# Patient Record
Sex: Female | Born: 1990 | Race: Black or African American | Hispanic: No | Marital: Married | Smoking: Never smoker
Health system: Southern US, Community
[De-identification: ages and names within clinical notes are randomized; demographics above are authoritative.]

## PROBLEM LIST (undated history)

## (undated) ENCOUNTER — Inpatient Hospital Stay (HOSPITAL_COMMUNITY): Payer: Self-pay

## (undated) DIAGNOSIS — N390 Urinary tract infection, site not specified: Secondary | ICD-10-CM

## (undated) DIAGNOSIS — Z789 Other specified health status: Secondary | ICD-10-CM

## (undated) HISTORY — DX: Other specified health status: Z78.9

---
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
## 2014-01-11 ENCOUNTER — Encounter (HOSPITAL_COMMUNITY): Payer: Self-pay | Admitting: Emergency Medicine

## 2014-01-11 ENCOUNTER — Emergency Department (HOSPITAL_COMMUNITY)
Admission: EM | Admit: 2014-01-11 | Discharge: 2014-01-11 | Disposition: A | Discharge status: Home / Self Care | Payer: Self-pay | Attending: Emergency Medicine | Admitting: Emergency Medicine

## 2014-01-11 DIAGNOSIS — R202 Paresthesia of skin: Secondary | ICD-10-CM

## 2014-01-11 DIAGNOSIS — R209 Unspecified disturbances of skin sensation: Secondary | ICD-10-CM | POA: Insufficient documentation

## 2014-01-11 DIAGNOSIS — R200 Anesthesia of skin: Secondary | ICD-10-CM

## 2014-01-11 NOTE — ED Notes (Signed)
Pt. reports generalized body intermittent numbness/tingling onset this morning , denies pain , ambulatory , alert and oriented /respirations unlabored .

## 2014-01-11 NOTE — ED Provider Notes (Signed)
CSN: [id number]     Arrival date & time 01/11/14  0302 History   First MD Initiated Contact with Patient 01/11/14 [phone]     Chief Complaint  Patient presents with  . Numbness  . Tingling     (Consider location/radiation/quality/duration/timing/severity/associated sxs/prior Treatment) HPI Comments: Pt comes in with cc of numbness, tingling. She has no medical hx, currently not on any meds, and has no neuro hx. She reports that after her work she started having numbness - over her bilateral upper and lower extremity. With that she had no weakness. Numbness is described as "being numb," and there was no tingling. She has had no sx like that. Symptoms went from 1 am to 2 am - and resolved on their own. No family hx of MS or any neuro disorders.  The history is provided by the patient.    History reviewed. No pertinent past medical history. History reviewed. No pertinent past surgical history. No family history on file. History  Substance Use Topics  . Smoking status: Never Smoker   . Smokeless tobacco: Not on file  . Alcohol Use: No   OB History   Grav Para Term Preterm Abortions TAB SAB Ect Mult Living                 Review of Systems  Constitutional: Negative for fever and activity change.  Respiratory: Negative for shortness of breath.   Cardiovascular: Negative for chest pain.  Gastrointestinal: Negative for nausea, vomiting and abdominal pain.  Genitourinary: Negative for dysuria.  Musculoskeletal: Negative for back pain and neck pain.  Skin: Negative for rash.  Neurological: Positive for numbness. Negative for dizziness, seizures, facial asymmetry, speech difficulty, weakness, light-headedness and headaches.      Allergies  Review of patient's allergies indicates no known allergies.  Home Medications   Prior to Admission medications   Not on File   BP 141/94  Pulse 98  Temp(Src) 98.2 F (36.8 C) (Oral)  Resp 14  Ht 4\' 11"  (1.499 m)  Wt 167 lb (75.751 kg)   BMI 33.71 kg/m2  SpO2 99% Physical Exam  Nursing note and vitals reviewed. Constitutional: She is oriented to person, place, and time. She appears well-developed and well-nourished.  HENT:  Head: Normocephalic and atraumatic.  Eyes: EOM are normal. Pupils are equal, round, and reactive to light.  Neck: Neck supple.  Cardiovascular: Normal rate, regular rhythm and normal heart sounds.   No murmur heard. Pulmonary/Chest: Effort normal. No respiratory distress.  Abdominal: Soft. She exhibits no distension. There is no tenderness. There is no rebound and no guarding.  Neurological: She is alert and oriented to person, place, and time. No cranial nerve deficit. Coordination normal.  Cerebellar exam is normal (finger to nose) Sensory exam normal for bilateral upper and lower extremities - and patient is able to discriminate between sharp and dull. Motor exam is 4+/5   Skin: Skin is warm and dry.    ED Course  Procedures (including critical care time) Labs Review Labs Reviewed - No data to display  Imaging Review No results found.   EKG Interpretation None      MDM   Final diagnoses:  Numbness and tingling   Pt comes in with cc of numbness. She has no numbness right now. Bilateral upper and lower extremities, no rash, no weakness, no tick bites, no neurologic dz for self or family, and no n/v.  Will d/c     Derwood KaplanAnkit Nanavati, MD 01/11/14 [phone]

## 2014-01-11 NOTE — ED Notes (Signed)
Patient states that she has been having times of numbness and tingling that come and go thru out her body.  Patient denies any pain at this time.

## 2014-01-11 NOTE — Discharge Instructions (Signed)

## 2014-01-23 ENCOUNTER — Encounter (HOSPITAL_COMMUNITY): Payer: Self-pay | Admitting: Emergency Medicine

## 2014-01-23 ENCOUNTER — Emergency Department (HOSPITAL_COMMUNITY)
Admission: EM | Admit: 2014-01-23 | Discharge: 2014-01-23 | Disposition: A | Discharge status: Home / Self Care | Payer: Self-pay | Attending: Emergency Medicine | Admitting: Emergency Medicine

## 2014-01-23 DIAGNOSIS — R42 Dizziness and giddiness: Secondary | ICD-10-CM | POA: Insufficient documentation

## 2014-01-23 DIAGNOSIS — Z3202 Encounter for pregnancy test, result negative: Secondary | ICD-10-CM | POA: Insufficient documentation

## 2014-01-23 LAB — BASIC METABOLIC PANEL
BUN: 6 mg/dL (ref 6–23)
CALCIUM: 8.8 mg/dL (ref 8.4–10.5)
CHLORIDE: 102 meq/L (ref 96–112)
CO2: 25 mEq/L (ref 19–32)
CREATININE: 0.57 mg/dL (ref 0.50–1.10)
GFR calc non Af Amer: >90 mL/min (ref >90)
Glucose, Bld: 110 mg/dL — ABNORMAL HIGH (ref 70–99)
Potassium: 3.3 mEq/L — ABNORMAL LOW (ref 3.7–5.3)
Sodium: 143 mEq/L (ref 137–147)

## 2014-01-23 LAB — CBC
HEMATOCRIT: 37.9 % (ref 36.0–46.0)
Hemoglobin: 12.7 g/dL (ref 12.0–15.0)
MCH: 29.7 pg (ref 26.0–34.0)
MCHC: 33.5 g/dL (ref 30.0–36.0)
MCV: 88.8 fL (ref 78.0–100.0)
PLATELETS: 331 10*3/uL (ref 150–400)
RBC: 4.27 MIL/uL (ref 3.87–5.11)
RDW: 12.6 % (ref 11.5–15.5)
WBC: 4.3 10*3/uL (ref 4.0–10.5)

## 2014-01-23 LAB — URINALYSIS, ROUTINE W REFLEX MICROSCOPIC
BILIRUBIN URINE: NEGATIVE
Glucose, UA: NEGATIVE mg/dL
Hgb urine dipstick: NEGATIVE
KETONES UR: 15 mg/dL — AB
LEUKOCYTES UA: NEGATIVE
NITRITE: NEGATIVE
PH: 6 (ref 5.0–8.0)
PROTEIN: NEGATIVE mg/dL
Specific Gravity, Urine: 1.026 (ref 1.005–1.030)
Urobilinogen, UA: 1 mg/dL (ref 0.0–1.0)

## 2014-01-23 LAB — POC URINE PREG, ED: Preg Test, Ur: NEGATIVE

## 2014-01-23 MED ORDER — MECLIZINE HCL 25 MG PO TABS: 25.0000 mg | ORAL_TABLET | Freq: Three times a day (TID) | ORAL | Status: DC

## 2014-01-23 MED ORDER — MECLIZINE HCL 25 MG PO TABS
25.0000 mg | ORAL_TABLET | Freq: Once | ORAL | Status: AC
  Administered 2014-01-23: 25 mg via ORAL
  Filled 2014-01-23: qty 1

## 2014-01-23 NOTE — ED Notes (Signed)
She states shes felt lightheaded and tingly all over her body for several weeks. She came here on 6/18 for same "but they didn't find anything wrong." shes a&ox4, breathing easily, denies pain

## 2014-01-23 NOTE — ED Provider Notes (Signed)
CSN: [id number]     Arrival date & time 01/23/14  1702 History   First MD Initiated Contact with Patient 01/23/14 1833     Chief Complaint  Patient presents with  . Dizziness     (Consider location/radiation/quality/duration/timing/severity/associated sxs/prior Treatment) HPI Patient presents with concern of episodic lightheadedness. Symptoms are actually began several weeks ago, have been persistent, in spite of being seen here 2 weeks ago. She notes that typically upon standing up she has episodic lightheadedness, dizziness, with no syncope, pain. Symptoms seem to resolve without particular intervention. Symptoms are only present with upright positioning. There is a no concurrent pain, confusion, disorientation, dyspnea, nausea, vomiting. Patient does not have a primary care physician.  History reviewed. No pertinent past medical history. History reviewed. No pertinent past surgical history. History reviewed. No pertinent family history. History  Substance Use Topics  . Smoking status: Never Smoker   . Smokeless tobacco: Not on file  . Alcohol Use: Yes   OB History   Grav Para Term Preterm Abortions TAB SAB Ect Mult Living                 Review of Systems  Constitutional:       Per HPI, otherwise negative  HENT:       Per HPI, otherwise negative  Respiratory:       Per HPI, otherwise negative  Cardiovascular:       Per HPI, otherwise negative  Gastrointestinal: Negative for vomiting.  Endocrine:       Negative aside from HPI  Genitourinary:       Neg aside from HPI   Musculoskeletal:       Per HPI, otherwise negative  Skin: Negative.   Neurological: Positive for dizziness. Negative for syncope.      Allergies  Review of patient's allergies indicates no known allergies.  Home Medications   Prior to Admission medications   Not on File   BP 139/84  Pulse 78  Temp(Src) 98.3 F (36.8 C) (Oral)  Resp 20  Ht 5' (1.524 m)  Wt 160 lb (72.576 kg)  BMI  31.25 kg/m2  SpO2 100% Physical Exam  Nursing note and vitals reviewed. Constitutional: She is oriented to person, place, and time. She appears well-developed and well-nourished. No distress.  HENT:  Head: Normocephalic and atraumatic.  Eyes: Conjunctivae and EOM are normal.  Cardiovascular: Normal rate and regular rhythm.   Pulmonary/Chest: Effort normal and breath sounds normal. No stridor. No respiratory distress.  Abdominal: She exhibits no distension.  Musculoskeletal: She exhibits no edema.  Neurological: She is alert and oriented to person, place, and time. She displays no atrophy and no tremor. No cranial nerve deficit. She exhibits normal muscle tone. She displays no seizure activity. Coordination normal.  Skin: Skin is warm and dry.  Psychiatric: She has a normal mood and affect.    ED Course  Procedures (including critical care time) Labs Review Labs Reviewed  BASIC METABOLIC PANEL - Abnormal; Notable for the following:    Potassium 3.3 (*)    Glucose, Bld 110 (*)    All other components within normal limits  CBC  URINALYSIS, ROUTINE W REFLEX MICROSCOPIC  POC URINE PREG, ED       MDM   Patient presents with episodic lightheadedness, typically with upright positioning.  Patient's evaluation here is largely reassuring, with no evidence of infection, neurologic compromise, focal findings on exam. Patient may have vertigo, versus vasogenic anemia, the patient's blood work today does not  suggest profound anemia. Patient was discharged with a course of empiric medication for vertigo, provided resources for outpatient followup.  Gerhard Munchobert Lockwood, MD 01/23/14 [phone]

## 2014-01-23 NOTE — Discharge Instructions (Signed)
As discussed, your evaluation today has been largely reassuring.  But, it is important that you monitor your condition carefully, and do not hesitate to return to the ED if you develop new, or concerning changes in your condition.  Otherwise, please follow-up with your new physician for appropriate ongoing care.   Dizziness Dizziness is a common problem. It is a feeling of unsteadiness or light-headedness. You may feel like you are about to faint. Dizziness can lead to injury if you stumble or fall. A person of any age group can suffer from dizziness, but dizziness is more common in older adults. CAUSES  Dizziness can be caused by many different things, including:  Middle ear problems.  Standing for too long.  Infections.  An allergic reaction.  Aging.  An emotional response to something, such as the sight of blood.  Side effects of medicines.  Tiredness.  Problems with circulation or blood pressure.  Excessive use of alcohol or medicines, or illegal drug use.  Breathing too fast (hyperventilation).  An irregular heart rhythm (arrhythmia).  A low red blood cell count (anemia).  Pregnancy.  Vomiting, diarrhea, fever, or other illnesses that cause body fluid loss (dehydration).  Diseases or conditions such as Parkinson's disease, high blood pressure (hypertension), diabetes, and thyroid problems.  Exposure to extreme heat. DIAGNOSIS  Your health care provider will ask about your symptoms, perform a physical exam, and perform an electrocardiogram (ECG) to record the electrical activity of your heart. Your health care provider may also perform other heart or blood tests to determine the cause of your dizziness. These may include:  Transthoracic echocardiogram (TTE). During echocardiography, sound waves are used to evaluate how blood flows through your heart.  Transesophageal echocardiogram (TEE).  Cardiac monitoring. This allows your health care provider to monitor your  heart rate and rhythm in real time.  Holter monitor. This is a portable device that records your heartbeat and can help diagnose heart arrhythmias. It allows your health care provider to track your heart activity for several days if needed.  Stress tests by exercise or by giving medicine that makes the heart beat faster. TREATMENT  Treatment of dizziness depends on the cause of your symptoms and can vary greatly. HOME CARE INSTRUCTIONS   Drink enough fluids to keep your urine clear or pale yellow. This is especially important in very hot weather. In older adults, it is also important in cold weather.  Take your medicine exactly as directed if your dizziness is caused by medicines. When taking blood pressure medicines, it is especially important to get up slowly.  Rise slowly from chairs and steady yourself until you feel okay.  In the morning, first sit up on the side of the bed. When you feel okay, stand slowly while holding onto something until you know your balance is fine.  Move your legs often if you need to stand in one place for a long time. Tighten and relax your muscles in your legs while standing.  Have someone stay with you for 1-2 days if dizziness continues to be a problem. Do this until you feel you are well enough to stay alone. Have the person call your health care provider if he or she notices changes in you that are concerning.  Do not drive or use heavy machinery if you feel dizzy.  Do not drink alcohol. SEEK IMMEDIATE MEDICAL CARE IF:   Your dizziness or light-headedness gets worse.  You feel nauseous or vomit.  You have problems talking, walking,  or using your arms, hands, or legs.  You feel weak.  You are not thinking clearly or you have trouble forming sentences. It may take a friend or family member to notice this.  You have chest pain, abdominal pain, shortness of breath, or sweating.  Your vision changes.  You notice any bleeding.  You have side  effects from medicine that seems to be getting worse rather than better. MAKE SURE YOU:   Understand these instructions.  Will watch your condition.  Will get help right away if you are not doing well or get worse. Document Released: 01/06/2001 Document Revised: 07/18/2013 Document Reviewed: 01/30/2011 [facility] Patient Information 2015 [place], [place]. This information is not intended to replace advice given to you by your health care provider. Make sure you discuss any questions you have with your health care provider.

## 2014-01-23 NOTE — ED Notes (Signed)
The patient states the dizziness is when she stands up. It comes and goes.

## 2014-02-15 ENCOUNTER — Ambulatory Visit: Payer: Self-pay | Attending: Internal Medicine

## 2015-04-24 ENCOUNTER — Ambulatory Visit (INDEPENDENT_AMBULATORY_CARE_PROVIDER_SITE_OTHER): Payer: 59 | Admitting: Obstetrics & Gynecology

## 2015-04-24 ENCOUNTER — Encounter: Payer: Self-pay | Admitting: Obstetrics & Gynecology

## 2015-04-24 VITALS — BP 117/83 | HR 80 | Wt 179.0 lb

## 2015-04-24 DIAGNOSIS — Z113 Encounter for screening for infections with a predominantly sexual mode of transmission: Secondary | ICD-10-CM | POA: Diagnosis not present

## 2015-04-24 DIAGNOSIS — Z124 Encounter for screening for malignant neoplasm of cervix: Secondary | ICD-10-CM | POA: Diagnosis not present

## 2015-04-24 DIAGNOSIS — O99211 Obesity complicating pregnancy, first trimester: Secondary | ICD-10-CM

## 2015-04-24 DIAGNOSIS — O99210 Obesity complicating pregnancy, unspecified trimester: Secondary | ICD-10-CM | POA: Insufficient documentation

## 2015-04-24 DIAGNOSIS — Z3490 Encounter for supervision of normal pregnancy, unspecified, unspecified trimester: Secondary | ICD-10-CM | POA: Insufficient documentation

## 2015-04-24 DIAGNOSIS — Z3401 Encounter for supervision of normal first pregnancy, first trimester: Secondary | ICD-10-CM | POA: Diagnosis not present

## 2015-04-24 DIAGNOSIS — Z3491 Encounter for supervision of normal pregnancy, unspecified, first trimester: Secondary | ICD-10-CM

## 2015-04-24 DIAGNOSIS — E669 Obesity, unspecified: Secondary | ICD-10-CM

## 2015-04-24 NOTE — Patient Instructions (Signed)
First Trimester of Pregnancy The first trimester of pregnancy is from week 1 until the end of week 12 (months 1 through 3). A week after a sperm fertilizes an egg, the egg will implant on the wall of the uterus. This embryo will begin to develop into a baby. Genes from you and your partner are forming the baby. The female genes determine whether the baby is a boy or a girl. At 6-8 weeks, the eyes and face are formed, and the heartbeat can be seen on ultrasound. At the end of 12 weeks, all the baby's organs are formed.  Now that you are pregnant, you will want to do everything you can to have a healthy baby. Two of the most important things are to get good prenatal care and to follow your health care provider's instructions. Prenatal care is all the medical care you receive before the baby's birth. This care will help prevent, find, and treat any problems during the pregnancy and childbirth. BODY CHANGES Your body goes through many changes during pregnancy. The changes vary from woman to woman.   You may gain or lose a couple of pounds at first.  You may feel sick to your stomach (nauseous) and throw up (vomit). If the vomiting is uncontrollable, call your health care provider.  You may tire easily.  You may develop headaches that can be relieved by medicines approved by your health care provider.  You may urinate more often. Painful urination may mean you have a bladder infection.  You may develop heartburn as a result of your pregnancy.  You may develop constipation because certain hormones are causing the muscles that push waste through your intestines to slow down.  You may develop hemorrhoids or swollen, bulging veins (varicose veins).  Your breasts may begin to grow larger and become tender. Your nipples may stick out more, and the tissue that surrounds them (areola) may become darker.  Your gums may bleed and may be sensitive to brushing and flossing.  Dark spots or blotches  (chloasma, mask of pregnancy) may develop on your face. This will likely fade after the baby is born.  Your menstrual periods will stop.  You may have a loss of appetite.  You may develop cravings for certain kinds of food.  You may have changes in your emotions from day to day, such as being excited to be pregnant or being concerned that something may go wrong with the pregnancy and baby.  You may have more vivid and strange dreams.  You may have changes in your hair. These can include thickening of your hair, rapid growth, and changes in texture. Some women also have hair loss during or after pregnancy, or hair that feels dry or thin. Your hair will most likely return to normal after your baby is born. WHAT TO EXPECT AT YOUR PRENATAL VISITS During a routine prenatal visit:  You will be weighed to make sure you and the baby are growing normally.  Your blood pressure will be taken.  Your abdomen will be measured to track your baby's growth.  The fetal heartbeat will be listened to starting around week 10 or 12 of your pregnancy.  Test results from any previous visits will be discussed. Your health care provider may ask you:  How you are feeling.  If you are feeling the baby move.  If you have had any abnormal symptoms, such as leaking fluid, bleeding, severe headaches, or abdominal cramping.  If you have any questions. Other tests   that may be performed during your first trimester include:  Blood tests to find your blood type and to check for the presence of any previous infections. They will also be used to check for low iron levels (anemia) and Rh antibodies. Later in the pregnancy, blood tests for diabetes will be done along with other tests if problems develop.  Urine tests to check for infections, diabetes, or protein in the urine.  An ultrasound to confirm the proper growth and development of the baby.  An amniocentesis to check for possible genetic problems.  Fetal  screens for spina bifida and Down syndrome.  You may need other tests to make sure you and the baby are doing well. HOME CARE INSTRUCTIONS  Medicines  Follow your health care provider's instructions regarding medicine use. Specific medicines may be either safe or unsafe to take during pregnancy.  Take your prenatal vitamins as directed.  If you develop constipation, try taking a stool softener if your health care provider approves. Diet  Eat regular, well-balanced meals. Choose a variety of foods, such as meat or vegetable-based protein, fish, milk and low-fat dairy products, vegetables, fruits, and whole grain breads and cereals. Your health care provider will help you determine the amount of weight gain that is right for you.  Avoid raw meat and uncooked cheese. These carry germs that can cause birth defects in the baby.  Eating four or five small meals rather than three large meals a day may help relieve nausea and vomiting. If you start to feel nauseous, eating a few soda crackers can be helpful. Drinking liquids between meals instead of during meals also seems to help nausea and vomiting.  If you develop constipation, eat more high-fiber foods, such as fresh vegetables or fruit and whole grains. Drink enough fluids to keep your urine clear or pale yellow. Activity and Exercise  Exercise only as directed by your health care provider. Exercising will help you:  Control your weight.  Stay in shape.  Be prepared for labor and delivery.  Experiencing pain or cramping in the lower abdomen or low back is a good sign that you should stop exercising. Check with your health care provider before continuing normal exercises.  Try to avoid standing for long periods of time. Move your legs often if you must stand in one place for a long time.  Avoid heavy lifting.  Wear low-heeled shoes, and practice good posture.  You may continue to have sex unless your health care provider directs you  otherwise. Relief of Pain or Discomfort  Wear a good support bra for breast tenderness.   Take warm sitz baths to soothe any pain or discomfort caused by hemorrhoids. Use hemorrhoid cream if your health care provider approves.   Rest with your legs elevated if you have leg cramps or low back pain.  If you develop varicose veins in your legs, wear support hose. Elevate your feet for 15 minutes, 3-4 times a day. Limit salt in your diet. Prenatal Care  Schedule your prenatal visits by the twelfth week of pregnancy. They are usually scheduled monthly at first, then more often in the last 2 months before delivery.  Write down your questions. Take them to your prenatal visits.  Keep all your prenatal visits as directed by your health care provider. Safety  Wear your seat belt at all times when driving.  Make a list of emergency phone numbers, including numbers for family, friends, the hospital, and police and fire departments. General Tips    Ask your health care provider for a referral to a local prenatal education class. Begin classes no later than at the beginning of month 6 of your pregnancy.  Ask for help if you have counseling or nutritional needs during pregnancy. Your health care provider can offer advice or refer you to specialists for help with various needs.  Do not use hot tubs, steam rooms, or saunas.  Do not douche or use tampons or scented sanitary pads.  Do not cross your legs for long periods of time.  Avoid cat litter boxes and soil used by cats. These carry germs that can cause birth defects in the baby and possibly loss of the fetus by miscarriage or stillbirth.  Avoid all smoking, herbs, alcohol, and medicines not prescribed by your health care provider. Chemicals in these affect the formation and growth of the baby.  Schedule a dentist appointment. At home, brush your teeth with a soft toothbrush and be gentle when you floss. SEEK MEDICAL CARE IF:   You have  dizziness.  You have mild pelvic cramps, pelvic pressure, or nagging pain in the abdominal area.  You have persistent nausea, vomiting, or diarrhea.  You have a bad smelling vaginal discharge.  You have pain with urination.  You notice increased swelling in your face, hands, legs, or ankles. SEEK IMMEDIATE MEDICAL CARE IF:   You have a fever.  You are leaking fluid from your vagina.  You have spotting or bleeding from your vagina.  You have severe abdominal cramping or pain.  You have rapid weight gain or loss.  You vomit blood or material that looks like coffee grounds.  You are exposed to German measles and have never had them.  You are exposed to fifth disease or chickenpox.  You develop a severe headache.  You have shortness of breath.  You have any kind of trauma, such as from a fall or a car accident. Document Released: 07/07/2001 Document Revised: 11/27/2013 Document Reviewed: 05/23/2013 [employer] Patient Information 2015 [employer]. This information is not intended to replace advice given to you by your health care provider. Make sure you discuss any questions you have with your health care provider.  Breastfeeding Deciding to breastfeed is one of the best choices you can make for you and your baby. A change in hormones during pregnancy causes your breast tissue to grow and increases the number and size of your milk ducts. These hormones also allow proteins, sugars, and fats from your blood supply to make breast milk in your milk-producing glands. Hormones prevent breast milk from being released before your baby is born as well as prompt milk flow after birth. Once breastfeeding has begun, thoughts of your baby, as well as his or her sucking or crying, can stimulate the release of milk from your milk-producing glands.  BENEFITS OF BREASTFEEDING For Your Baby  Your first milk (colostrum) helps your baby's digestive system function better.   There are antibodies  in your milk that help your baby fight off infections.   Your baby has a lower incidence of asthma, allergies, and sudden infant death syndrome.   The nutrients in breast milk are better for your baby than infant formulas and are designed uniquely for your baby's needs.   Breast milk improves your baby's brain development.   Your baby is less likely to develop other conditions, such as childhood obesity, asthma, or type 2 diabetes mellitus.  For You   Breastfeeding helps to create a very special bond between   you and your baby.   Breastfeeding is convenient. Breast milk is always available at the correct temperature and costs nothing.   Breastfeeding helps to burn calories and helps you lose the weight gained during pregnancy.   Breastfeeding makes your uterus contract to its prepregnancy size faster and slows bleeding (lochia) after you give birth.   Breastfeeding helps to lower your risk of developing type 2 diabetes mellitus, osteoporosis, and breast or ovarian cancer later in life. SIGNS THAT YOUR BABY IS HUNGRY Early Signs of Hunger  Increased alertness or activity.  Stretching.  Movement of the head from side to side.  Movement of the head and opening of the mouth when the corner of the mouth or cheek is stroked (rooting).  Increased sucking sounds, smacking lips, cooing, sighing, or squeaking.  Hand-to-mouth movements.  Increased sucking of fingers or hands. Late Signs of Hunger  Fussing.  Intermittent crying. Extreme Signs of Hunger Signs of extreme hunger will require calming and consoling before your baby will be able to breastfeed successfully. Do not wait for the following signs of extreme hunger to occur before you initiate breastfeeding:   Restlessness.  A loud, strong cry.   Screaming. BREASTFEEDING BASICS Breastfeeding Initiation  Find a comfortable place to sit or lie down, with your neck and back well supported.  Place a pillow or  rolled up blanket under your baby to bring him or her to the level of your breast (if you are seated). Nursing pillows are specially designed to help support your arms and your baby while you breastfeed.  Make sure that your baby's abdomen is facing your abdomen.   Gently massage your breast. With your fingertips, massage from your chest wall toward your nipple in a circular motion. This encourages milk flow. You may need to continue this action during the feeding if your milk flows slowly.  Support your breast with 4 fingers underneath and your thumb above your nipple. Make sure your fingers are well away from your nipple and your baby's mouth.   Stroke your baby's lips gently with your finger or nipple.   When your baby's mouth is open wide enough, quickly bring your baby to your breast, placing your entire nipple and as much of the colored area around your nipple (areola) as possible into your baby's mouth.   More areola should be visible above your baby's upper lip than below the lower lip.   Your baby's tongue should be between his or her lower gum and your breast.   Ensure that your baby's mouth is correctly positioned around your nipple (latched). Your baby's lips should create a seal on your breast and be turned out (everted).  It is common for your baby to suck about 2-3 minutes in order to start the flow of breast milk. Latching Teaching your baby how to latch on to your breast properly is very important. An improper latch can cause nipple pain and decreased milk supply for you and poor weight gain in your baby. Also, if your baby is not latched onto your nipple properly, he or she may swallow some air during feeding. This can make your baby fussy. Burping your baby when you switch breasts during the feeding can help to get rid of the air. However, teaching your baby to latch on properly is still the best way to prevent fussiness from swallowing air while breastfeeding. Signs  that your baby has successfully latched on to your nipple:    Silent tugging or silent   sucking, without causing you pain.   Swallowing heard between every 3-4 sucks.    Muscle movement above and in front of his or her ears while sucking.  Signs that your baby has not successfully latched on to nipple:   Sucking sounds or smacking sounds from your baby while breastfeeding.  Nipple pain. If you think your baby has not latched on correctly, slip your finger into the corner of your baby's mouth to break the suction and place it between your baby's gums. Attempt breastfeeding initiation again. Signs of Successful Breastfeeding Signs from your baby:   A gradual decrease in the number of sucks or complete cessation of sucking.   Falling asleep.   Relaxation of his or her body.   Retention of a small amount of milk in his or her mouth.   Letting go of your breast by himself or herself. Signs from you:  Breasts that have increased in firmness, weight, and size 1-3 hours after feeding.   Breasts that are softer immediately after breastfeeding.  Increased milk volume, as well as a change in milk consistency and color by the fifth day of breastfeeding.   Nipples that are not sore, cracked, or bleeding. Signs That Your Baby is Getting Enough Milk  Wetting at least 3 diapers in a 24-hour period. The urine should be clear and pale yellow by age 5 days.  At least 3 stools in a 24-hour period by age 5 days. The stool should be soft and yellow.  At least 3 stools in a 24-hour period by age 7 days. The stool should be seedy and yellow.  No loss of weight greater than 10% of birth weight during the first 3 days of age.  Average weight gain of 4-7 ounces (113-198 g) per week after age 4 days.  Consistent daily weight gain by age 5 days, without weight loss after the age of 2 weeks. After a feeding, your baby may spit up a small amount. This is common. BREASTFEEDING FREQUENCY AND  DURATION Frequent feeding will help you make more milk and can prevent sore nipples and breast engorgement. Breastfeed when you feel the need to reduce the fullness of your breasts or when your baby shows signs of hunger. This is called "breastfeeding on demand." Avoid introducing a pacifier to your baby while you are working to establish breastfeeding (the first 4-6 weeks after your baby is born). After this time you may choose to use a pacifier. Research has shown that pacifier use during the first year of a baby's life decreases the risk of sudden infant death syndrome (SIDS). Allow your baby to feed on each breast as long as he or she wants. Breastfeed until your baby is finished feeding. When your baby unlatches or falls asleep while feeding from the first breast, offer the second breast. Because newborns are often sleepy in the first few weeks of life, you may need to awaken your baby to get him or her to feed. Breastfeeding times will vary from baby to baby. However, the following rules can serve as a guide to help you ensure that your baby is properly fed:  Newborns (babies 4 weeks of age or younger) may breastfeed every 1-3 hours.  Newborns should not go longer than 3 hours during the day or 5 hours during the night without breastfeeding.  You should breastfeed your baby a minimum of 8 times in a 24-hour period until you begin to introduce solid foods to your baby at around 6   months of age. BREAST MILK PUMPING Pumping and storing breast milk allows you to ensure that your baby is exclusively fed your breast milk, even at times when you are unable to breastfeed. This is especially important if you are going back to work while you are still breastfeeding or when you are not able to be present during feedings. Your lactation consultant can give you guidelines on how long it is safe to store breast milk.  A breast pump is a machine that allows you to pump milk from your breast into a sterile bottle.  The pumped breast milk can then be stored in a refrigerator or freezer. Some breast pumps are operated by hand, while others use electricity. Ask your lactation consultant which type will work best for you. Breast pumps can be purchased, but some hospitals and breastfeeding support groups lease breast pumps on a monthly basis. A lactation consultant can teach you how to hand express breast milk, if you prefer not to use a pump.  CARING FOR YOUR BREASTS WHILE YOU BREASTFEED Nipples can become dry, cracked, and sore while breastfeeding. The following recommendations can help keep your breasts moisturized and healthy:  Avoid using soap on your nipples.   Wear a supportive bra. Although not required, special nursing bras and tank tops are designed to allow access to your breasts for breastfeeding without taking off your entire bra or top. Avoid wearing underwire-style bras or extremely tight bras.  Air dry your nipples for 3-4minutes after each feeding.   Use only cotton bra pads to absorb leaked breast milk. Leaking of breast milk between feedings is normal.   Use lanolin on your nipples after breastfeeding. Lanolin helps to maintain your skin's normal moisture barrier. If you use pure lanolin, you do not need to wash it off before feeding your baby again. Pure lanolin is not toxic to your baby. You may also hand express a few drops of breast milk and gently massage that milk into your nipples and allow the milk to air dry. In the first few weeks after giving birth, some women experience extremely full breasts (engorgement). Engorgement can make your breasts feel heavy, warm, and tender to the touch. Engorgement peaks within 3-5 days after you give birth. The following recommendations can help ease engorgement:  Completely empty your breasts while breastfeeding or pumping. You may want to start by applying warm, moist heat (in the shower or with warm water-soaked hand towels) just before feeding or  pumping. This increases circulation and helps the milk flow. If your baby does not completely empty your breasts while breastfeeding, pump any extra milk after he or she is finished.  Wear a snug bra (nursing or regular) or tank top for 1-2 days to signal your body to slightly decrease milk production.  Apply ice packs to your breasts, unless this is too uncomfortable for you.  Make sure that your baby is latched on and positioned properly while breastfeeding. If engorgement persists after 48 hours of following these recommendations, contact your health care provider or a lactation consultant. OVERALL HEALTH CARE RECOMMENDATIONS WHILE BREASTFEEDING  Eat healthy foods. Alternate between meals and snacks, eating 3 of each per day. Because what you eat affects your breast milk, some of the foods may make your baby more irritable than usual. Avoid eating these foods if you are sure that they are negatively affecting your baby.  Drink milk, fruit juice, and water to satisfy your thirst (about 10 glasses a day).   Rest   often, relax, and continue to take your prenatal vitamins to prevent fatigue, stress, and anemia.  Continue breast self-awareness checks.  Avoid chewing and smoking tobacco.  Avoid alcohol and drug use. Some medicines that may be harmful to your baby can pass through breast milk. It is important to ask your health care provider before taking any medicine, including all over-the-counter and prescription medicine as well as vitamin and herbal supplements. It is possible to become pregnant while breastfeeding. If birth control is desired, ask your health care provider about options that will be safe for your baby. SEEK MEDICAL CARE IF:   You feel like you want to stop breastfeeding or have become frustrated with breastfeeding.  You have painful breasts or nipples.  Your nipples are cracked or bleeding.  Your breasts are red, tender, or warm.  You have a swollen area on either  breast.  You have a fever or chills.  You have nausea or vomiting.  You have drainage other than breast milk from your nipples.  Your breasts do not become full before feedings by the fifth day after you give birth.  You feel sad and depressed.  Your baby is too sleepy to eat well.  Your baby is having trouble sleeping.   Your baby is wetting less than 3 diapers in a 24-hour period.  Your baby has less than 3 stools in a 24-hour period.  Your baby's skin or the white part of his or her eyes becomes yellow.   Your baby is not gaining weight by 5 days of age. SEEK IMMEDIATE MEDICAL CARE IF:   Your baby is overly tired (lethargic) and does not want to wake up and feed.  Your baby develops an unexplained fever. Document Released: 07/13/2005 Document Revised: 07/18/2013 Document Reviewed: 01/04/2013 [employer] Patient Information 2015 [employer]. This information is not intended to replace advice given to you by your health care provider. Make sure you discuss any questions you have with your health care provider.  

## 2015-04-24 NOTE — Assessment & Plan Note (Signed)
BABYSCRIPTS PATIENT: [x ] initial, [ ] 12, [ ] 20, [ ] 28, [ ] 32, [ ] 36, [ ] 38, [ ] 39, [ ] 40 

## 2015-04-24 NOTE — Progress Notes (Signed)
Bedside ultrasound today measures [redacted]w[redacted]d fetus with heartbeat.  

## 2015-04-24 NOTE — Progress Notes (Signed)
   Subjective:    Kristie Bracewell Loyd is a G1P0 [redacted]w[redacted]d being seen today for her first obstetrical visit.  Her obstetrical history is significant for obesity. Patient does intend to breast feed. Pregnancy history fully reviewed.  Patient reports nausea and some loss of appetitie, able to eat and keep food down.Ceasar Mons Vitals:   04/24/15 1046  BP: 117/83  Pulse: 80  Weight: 179 lb (81.194 kg)    HISTORY: OB History  Gravida Para Term Preterm AB SAB TAB Ectopic Multiple Living  1             # Outcome Date GA Lbr Len/2nd Weight Sex Delivery Anes PTL Lv  1 Current              Past Medical History  Diagnosis Date  . Medical history non-contributory    Past Surgical History  Procedure Laterality Date  . Wisdom tooth removal  2010   Family History  Problem Relation Age of Onset  . Hypertension Mother   . Diabetes Mother      Exam    Uterus:     Pelvic Exam:    Perineum: No Hemorrhoids, Normal Perineum   Vulva: normal   Vagina:  normal mucosa, normal discharge   Cervix: no cervical motion tenderness and nulliparous appearance   Adnexa: normal adnexa and no mass, fullness, tenderness   Bony Pelvis: average  System: Breast:  normal appearance, no masses or tenderness, Inspection negative   Skin: normal coloration and turgor, no rashes   Neurologic: oriented, normal, negative   Extremities: normal strength, tone, and muscle mass, no deformities, ROM of all joints is normal   HEENT PERRLA, extra ocular movement intact and sclera clear, anicteric   Mouth/Teeth mucous membranes moist, pharynx normal without lesions and dental hygiene good   Neck supple and no masses   Cardiovascular: regular rate and rhythm, no murmurs or gallops   Respiratory:  appears well, vitals normal, no respiratory distress, acyanotic, normal RR, chest clear, no wheezing, crepitations, rhonchi, normal symmetric air entry   Abdomen: soft, non-tender; bowel sounds normal; no masses,  no organomegaly    Urinary: urethral meatus normal      Assessment:    Pregnancy: G1P0 Patient Active Problem List   Diagnosis Date Noted  . Supervision of normal pregnancy 04/24/2015    Plan:   Initial labs drawn.  Continue Prenatal vitamins. Problem list reviewed and updated. Discussed baby Scripts Program, she wants to do this.  Will be signed up for this. Will do early 1 hr GTT at next visit due to BMI >30. Recommended nutrition consult and weight gain <15 lbs. Genetic Screening discussed First Screen: ordered. Ultrasound discussed; fetal survey: to be ordered later. The nature of [facility] with multiple MDs and other Advanced Practice Providers was explained to patient; also emphasized that residents, students are part of our team. Follow up in 4 weeks for OB visit.   Tereso Newcomer, MD 04/24/2015

## 2015-04-25 ENCOUNTER — Encounter: Payer: 59 | Attending: Obstetrics & Gynecology | Admitting: Dietician

## 2015-04-25 ENCOUNTER — Encounter: Payer: Self-pay | Admitting: Dietician

## 2015-04-25 VITALS — Ht 59.0 in | Wt 178.9 lb

## 2015-04-25 DIAGNOSIS — Z6836 Body mass index (BMI) 36.0-36.9, adult: Secondary | ICD-10-CM | POA: Insufficient documentation

## 2015-04-25 DIAGNOSIS — O99211 Obesity complicating pregnancy, first trimester: Secondary | ICD-10-CM | POA: Insufficient documentation

## 2015-04-25 DIAGNOSIS — E669 Obesity, unspecified: Secondary | ICD-10-CM | POA: Insufficient documentation

## 2015-04-25 DIAGNOSIS — Z713 Dietary counseling and surveillance: Secondary | ICD-10-CM | POA: Diagnosis not present

## 2015-04-25 LAB — PRENATAL PROFILE (SOLSTAS)
ANTIBODY SCREEN: NEGATIVE
BASOS ABS: 0.1 10*3/uL (ref 0.0–0.1)
BASOS PCT: 1 % (ref 0–1)
EOS ABS: 0.4 10*3/uL (ref 0.0–0.7)
EOS PCT: 7 % — AB (ref 0–5)
HEMATOCRIT: 35.5 % — AB (ref 36.0–46.0)
HEMOGLOBIN: 12.1 g/dL (ref 12.0–15.0)
HIV 1&2 Ab, 4th Generation: NONREACTIVE
Hepatitis B Surface Ag: NEGATIVE
LYMPHS ABS: 2.1 10*3/uL (ref 0.7–4.0)
Lymphocytes Relative: 36 % (ref 12–46)
MCH: 29.2 pg (ref 26.0–34.0)
MCHC: 34.1 g/dL (ref 30.0–36.0)
MCV: 85.5 fL (ref 78.0–100.0)
MPV: 9 fL (ref 8.6–12.4)
Monocytes Absolute: 0.4 10*3/uL (ref 0.1–1.0)
Monocytes Relative: 7 % (ref 3–12)
NEUTROS PCT: 49 % (ref 43–77)
Neutro Abs: 2.9 10*3/uL (ref 1.7–7.7)
PLATELETS: 356 10*3/uL (ref 150–400)
RBC: 4.15 MIL/uL (ref 3.87–5.11)
RDW: 13.1 % (ref 11.5–15.5)
RH TYPE: POSITIVE
Rubella: 1.68 Index — ABNORMAL HIGH (ref <0.90)
WBC: 5.9 10*3/uL (ref 4.0–10.5)

## 2015-04-25 LAB — CYTOLOGY - PAP

## 2015-04-25 LAB — CULTURE, OB URINE
Colony Count: NO GROWTH
ORGANISM ID, BACTERIA: NO GROWTH

## 2015-04-25 NOTE — Patient Instructions (Signed)
Aim to eat 3 meals and have 2 snacks if you are hungry. At lunch and dinner, aim to fill half of your plate with vegetables (frozen or prewashed greens). Have protein (chicken) the size of the palm of your hand. Have starch/carb on a quarter of your plate (what you can fit in your hand). Try brown rice. Plan to bring lunch 2 x week - [place] sandwich with a salad. Choose grilled meats when you got out to eat. Try having an egg sandwich on English muffin for breakfast from a restaurant or have a small amount of cereal with a boiled egg. Have snacks with protein and carbs (see list) (apple with peanut butter, yogurt, or [facility] Protein bar).  Limit sugar in drinks (orange juice/soda). Drink mostly water or try flavoring water with packets (crystal light). Plan to walk 5 x week for 30 minutes.

## 2015-04-25 NOTE — Progress Notes (Signed)
  Medical Nutrition Therapy:  Appt start time: 1035 end time:  1115.   Assessment:  Primary concerns today: Adrienne Griffin is here today since she was referred by her doctor for obesity. Husband is at the appointment with her. She is [redacted] weeks pregnant. Has been feeling fine so far. Was on the depo shot and gained about 50 lbs in 2 years. Has lost a little bit of weight with pregnancy.    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 Sometimes skips breakfast is she wakes up late (2 x week). Eats out a lot of meals. She does the food shopping and meal preparation at home.   Would like to lose weight and eat better.  Preferred Learning Style:   No preference indicated   Learning Readiness:   Ready   MEDICATIONS: see list   DIETARY INTAKE:  Usual eating pattern includes 2-3 meals and 2 snacks per day.  Avoided foods include none  24-hr recall:  B ( AM): cereal Honey Nut Cheerios with 2% milk or [place] pancakes with eggs and [place] sausage or banana or muffin with orange juice (skips 2 x week) Snk ( AM): none or nutrigrain bar L ( PM): [employer]'s grilled or fried chicken with side salad and soda or [employer] grilled chicken cobb salad  Snk ( PM): none D ( PM): rice with beef stew or chicken sometimes with vegetables Snk ( PM): honeybun  Beverages: water, orange juice, soda  Usual physical activity: walking at work for 15-20 minutes 2 x week   Estimated energy needs: 2000 calories 225 g carbohydrates 150 g protein 50 g fat  Progress Towards Goal(s):  In progress.   Nutritional Diagnosis:  [place]-3.3 Overweight/obesity As related to hx of frequent fast food meals, sugary drinks, and inadequate physical activity.  As evidenced by BMI of 36.1.    Intervention:  Nutrition counseling provided. Plan: Aim to eat 3 meals and have 2 snacks if you are hungry. At lunch and dinner, aim to fill half of your plate with vegetables (frozen or prewashed greens). Have protein (chicken) the size of the  palm of your hand. Have starch/carb on a quarter of your plate (what you can fit in your hand). Try brown rice. Plan to bring lunch 2 x week - [place] sandwich with a salad. Choose grilled meats when you got out to eat. Try having an egg sandwich on English muffin for breakfast from a restaurant or have a small amount of cereal with a boiled egg. Have snacks with protein and carbs (see list) (apple with peanut butter, yogurt, or [facility] Protein bar).  Limit sugar in drinks (orange juice/soda). Drink mostly water or try flavoring water with packets (crystal light). Plan to walk 5 x week for 30 minutes.  Teaching Method Utilized:  Visual Auditory Hands on  Handouts given during visit include:  MyPlate Handout  15 g CHO Snacks  Barriers to learning/adherence to lifestyle change: none  Demonstrated degree of understanding via:  Teach Back   Monitoring/Evaluation:  Dietary intake, exercise, and body weight prn.

## 2015-04-26 LAB — HEMOGLOBINOPATHY EVALUATION
Hemoglobin Other: 0 %
Hgb A2 Quant: 2.8 % (ref 2.2–3.2)
Hgb A: 97.2 % (ref 96.8–97.8)
Hgb F Quant: 0 % (ref 0.0–2.0)
Hgb S Quant: 0 %

## 2015-05-22 ENCOUNTER — Encounter: Payer: Self-pay | Admitting: Obstetrics & Gynecology

## 2015-05-22 ENCOUNTER — Ambulatory Visit (INDEPENDENT_AMBULATORY_CARE_PROVIDER_SITE_OTHER): Payer: 59 | Admitting: Obstetrics & Gynecology

## 2015-05-22 VITALS — BP 117/83 | HR 87 | Wt 177.0 lb

## 2015-05-22 DIAGNOSIS — Z3401 Encounter for supervision of normal first pregnancy, first trimester: Secondary | ICD-10-CM

## 2015-05-22 DIAGNOSIS — Z3491 Encounter for supervision of normal pregnancy, unspecified, first trimester: Secondary | ICD-10-CM

## 2015-05-22 NOTE — Progress Notes (Signed)
Subjective:nausea is better  Adrienne Griffin is a 24 y.o. G1P0 at 2924w4d being seen today for ongoing prenatal care.  Patient reports nausea.  Contractions: Not present.  Vag. Bleeding: None. Movement: Absent. Denies leaking of fluid.   The following portions of the patient's history were reviewed and updated as appropriate: allergies, current medications, past family history, past medical history, past social history, past surgical history and problem list. Problem list updated.  Objective:   Filed Vitals:   05/22/15 1018  BP: 117/83  Pulse: 87  Weight: 177 lb (80.287 kg)    Fetal Status: Fetal Heart Rate (bpm): 162   Movement: Absent     General:  Alert, oriented and cooperative. Patient is in no acute distress.  Skin: Skin is warm and dry. No rash noted.   Cardiovascular: Normal heart rate noted  Respiratory: Normal respiratory effort, no problems with respiration noted  Abdomen: Soft, gravid, appropriate for gestational age. Pain/Pressure: Absent     Pelvic: Vag. Bleeding: None Vag D/C Character: Thin   Cervical exam deferred        Extremities: Normal range of motion.  Edema: None  Mental Status: Normal mood and affect. Normal behavior. Normal judgment and thought content.   Urinalysis:      Assessment and Plan:  Pregnancy: G1P0 at 124w4d  1. Supervision of normal pregnancy, first trimester NT is for next week. Discussed CFDNA testing as well, AFP  Preterm labor symptoms and general obstetric precautions including but not limited to vaginal bleeding, contractions, leaking of fluid and fetal movement were reviewed in detail with the patient. Please refer to After Visit Summary for other counseling recommendations.  Return in about 4 weeks (around 06/19/2015).   Adam PhenixJames G Maleena Eddleman, MD

## 2015-05-22 NOTE — Patient Instructions (Signed)

## 2015-05-28 ENCOUNTER — Encounter: Payer: Self-pay | Admitting: *Deleted

## 2015-05-29 ENCOUNTER — Encounter (HOSPITAL_COMMUNITY): Payer: Self-pay

## 2015-05-29 ENCOUNTER — Other Ambulatory Visit: Payer: Self-pay | Admitting: Obstetrics & Gynecology

## 2015-05-29 ENCOUNTER — Ambulatory Visit (HOSPITAL_COMMUNITY)
Admission: RE | Admit: 2015-05-29 | Discharge: 2015-05-29 | Disposition: A | Discharge status: Home / Self Care | Payer: 59 | Source: Ambulatory Visit | Attending: Obstetrics & Gynecology | Admitting: Obstetrics & Gynecology

## 2015-05-29 DIAGNOSIS — Z369 Encounter for antenatal screening, unspecified: Secondary | ICD-10-CM

## 2015-05-29 DIAGNOSIS — Z36 Encounter for antenatal screening of mother: Secondary | ICD-10-CM | POA: Diagnosis present

## 2015-05-29 DIAGNOSIS — Z3A12 12 weeks gestation of pregnancy: Secondary | ICD-10-CM | POA: Diagnosis not present

## 2015-05-29 DIAGNOSIS — Z3491 Encounter for supervision of normal pregnancy, unspecified, first trimester: Secondary | ICD-10-CM

## 2015-06-05 ENCOUNTER — Encounter: Payer: Self-pay | Admitting: *Deleted

## 2015-06-07 ENCOUNTER — Other Ambulatory Visit (HOSPITAL_COMMUNITY): Payer: Self-pay

## 2015-06-12 ENCOUNTER — Encounter (HOSPITAL_COMMUNITY): Payer: Self-pay | Admitting: Emergency Medicine

## 2015-06-12 ENCOUNTER — Emergency Department (HOSPITAL_COMMUNITY)
Admission: EM | Admit: 2015-06-12 | Discharge: 2015-06-12 | Disposition: A | Discharge status: Home / Self Care | Payer: Medicaid Other | Attending: Emergency Medicine | Admitting: Emergency Medicine

## 2015-06-12 DIAGNOSIS — O9989 Other specified diseases and conditions complicating pregnancy, childbirth and the puerperium: Secondary | ICD-10-CM | POA: Diagnosis present

## 2015-06-12 DIAGNOSIS — R202 Paresthesia of skin: Secondary | ICD-10-CM

## 2015-06-12 DIAGNOSIS — Z79899 Other long term (current) drug therapy: Secondary | ICD-10-CM | POA: Insufficient documentation

## 2015-06-12 DIAGNOSIS — M79602 Pain in left arm: Secondary | ICD-10-CM | POA: Diagnosis not present

## 2015-06-12 DIAGNOSIS — Z3A14 14 weeks gestation of pregnancy: Secondary | ICD-10-CM | POA: Insufficient documentation

## 2015-06-12 LAB — I-STAT CHEM 8, ED
BUN: 7 mg/dL (ref 6–20)
Calcium, Ion: 1.22 mmol/L (ref 1.12–1.23)
Chloride: 104 mmol/L (ref 101–111)
Creatinine, Ser: 0.4 mg/dL — ABNORMAL LOW (ref 0.44–1.00)
GLUCOSE: 86 mg/dL (ref 65–99)
HCT: 32 % — ABNORMAL LOW (ref 36.0–46.0)
HEMOGLOBIN: 10.9 g/dL — AB (ref 12.0–15.0)
POTASSIUM: 3.6 mmol/L (ref 3.5–5.1)
Sodium: 137 mmol/L (ref 135–145)
TCO2: 19 mmol/L (ref 0–100)

## 2015-06-12 NOTE — Discharge Instructions (Signed)
Paresthesia Paresthesia is an abnormal burning or prickling sensation. This sensation is generally felt in the hands, arms, legs, or feet. However, it may occur in any part of the body. Usually, it is not painful. The feeling may be described as:  Tingling or numbness.  Pins and needles.  Skin crawling.  Buzzing.  Limbs falling asleep.  Itching. Most people experience temporary (transient) paresthesia at some time in their lives. Paresthesia may occur when you breathe too quickly (hyperventilation). It can also occur without any apparent cause. Commonly, paresthesia occurs when pressure is placed on a nerve. The sensation quickly goes away after the pressure is removed. For some people, however, paresthesia is a long-lasting (chronic) condition that is caused by an underlying disorder. If you continue to have paresthesia, you may need further medical evaluation. HOME CARE INSTRUCTIONS Watch your condition for any changes. Taking the following actions may help to lessen any discomfort that you are feeling:  Avoid drinking alcohol.  Try acupuncture or massage to help relieve your symptoms.  Keep all follow-up visits as directed by your health care provider. This is important. SEEK MEDICAL CARE IF:  You continue to have episodes of paresthesia.  Your burning or prickling feeling gets worse when you walk.  You have pain, cramps, or dizziness.  You develop a rash. SEEK IMMEDIATE MEDICAL CARE IF:  You feel weak.  You have trouble walking or moving.  You have problems with speech, understanding, or vision.  You feel confused.  You cannot control your bladder or bowel movements.  You have numbness after an injury.  You faint.   This information is not intended to replace advice given to you by your health care provider. Make sure you discuss any questions you have with your health care provider.   Document Released: 07/03/2002 Document Revised: 11/27/2014 Document Reviewed:  07/09/2014 Elsevier Interactive Patient Education 2016 Elsevier Inc.  

## 2015-06-12 NOTE — ED Notes (Signed)
Pt. reports left arm discomfort , pain / tingling onset this evening , no pain at triage , denies injury .

## 2015-06-12 NOTE — ED Provider Notes (Signed)
CSN: [id number]     Arrival date & time 06/12/15  0137 History   By signing my name below, I, Arlan OrganAshley Leger, attest that this documentation has been prepared under the direction and in the presence of Loren Raceravid Kewan Mcnease, MD.  Electronically Signed: Arlan OrganAshley Leger, ED Scribe. 06/12/2015. 3:09 AM.   Chief Complaint  Patient presents with  . Arm Pain   The history is provided by the patient. No language interpreter was used.    HPI Comments: Adrienne Griffin L hand dominant, G1P0, currently [redacted] weeks gestation is a 24 y.o. female who presents to the Emergency Department complaining of L arm pain onset 1:00 PM this evening. Pt described feeling as "tingling" with pins and needles and rated 7/10 earlier this evening. However, pt states pain resolved when checking into the emergency department this evening. No recent injury or trauma. No aggravating or alleviating factors at this time. No OTC medications or home remedies attempted prior to arrival. She denies any chest pain, fever, chills, nausea, or vomiting. Denies any issues with current pregnancy.   PCP: Doris CheadleADVANI, DEEPAK, MD    Past Medical History  Diagnosis Date  . Medical history non-contributory    Past Surgical History  Procedure Laterality Date  . Wisdom tooth removal  2010   Family History  Problem Relation Age of Onset  . Hypertension Mother   . Diabetes Mother    Social History  Substance Use Topics  . Smoking status: Never Smoker   . Smokeless tobacco: None  . Alcohol Use: Yes     Comment: occasional pre-pregnancy   OB History    Gravida Para Term Preterm AB TAB SAB Ectopic Multiple Living   1              Review of Systems  Constitutional: Negative for fever and chills.  Eyes: Negative for visual disturbance.  Respiratory: Negative for shortness of breath.   Cardiovascular: Negative for chest pain.  Gastrointestinal: Negative for nausea, vomiting, abdominal pain and diarrhea.  Genitourinary: Negative for dysuria.   Musculoskeletal: Negative for back pain, arthralgias, neck pain and neck stiffness.  Skin: Negative for rash and wound.  Neurological: Negative for dizziness, syncope, weakness, light-headedness, numbness and headaches.  Psychiatric/Behavioral: Negative for confusion.  All other systems reviewed and are negative.     Allergies  Review of patient's allergies indicates no known allergies.  Home Medications   Prior to Admission medications   Medication Sig Start Date End Date Taking? Authorizing Provider  Prenatal Multivit-Min-Fe-FA (PRENATAL VITAMINS PO) Take 1 tablet by mouth daily.    Yes Historical Provider, MD   Triage Vitals: BP 119/82 mmHg  Pulse 86  Temp(Src) 98.3 F (36.8 C) (Oral)  Resp 24  SpO2 100%  LMP 03/02/2015 (Exact Date)   Physical Exam  Constitutional: She is oriented to person, place, and time. She appears well-developed and well-nourished. No distress.  HENT:  Head: Normocephalic and atraumatic.  Mouth/Throat: Oropharynx is clear and moist.  Eyes: EOM are normal. Pupils are equal, round, and reactive to light.  Neck: Normal range of motion. Neck supple.  No posterior cervical tenderness to palpation. No meningismus.  Cardiovascular: Normal rate and regular rhythm.  Exam reveals no gallop and no friction rub.   No murmur heard. Pulmonary/Chest: Effort normal and breath sounds normal. No respiratory distress. She has no wheezes. She has no rales. She exhibits no tenderness.  Abdominal: Soft. Bowel sounds are normal. She exhibits no distension and no mass. There is no tenderness.  There is no rebound and no guarding.  Musculoskeletal: Normal range of motion. She exhibits no edema or tenderness.  Negative Tinel's and Phalen's sign. Radial pulses 2+ and equal bilaterally. Good distal cap refill. No asymmetry noted between upper extremities.  Neurological: She is alert and oriented to person, place, and time.  5/5 motor and bilateral grip strength, elbow flexion  and shoulder abduction. Patient also has 5/5 motor in bilateral lower extremities. Sensation is fully intact to light touch.  Skin: Skin is warm and dry. No rash noted. No erythema.  Psychiatric: She has a normal mood and affect. Her behavior is normal.  Nursing note and vitals reviewed.   ED Course  Procedures (including critical care time)  DIAGNOSTIC STUDIES: Oxygen Saturation is 100% on RA, Normal by my interpretation.    COORDINATION OF CARE: 2:20 AM- Will order i-stat chem 8. Discussed treatment plan with pt at bedside and pt agreed to plan.     Labs Review Labs Reviewed  I-STAT CHEM 8, ED - Abnormal; Notable for the following:    Creatinine, Ser 0.40 (*)    Hemoglobin 10.9 (*)    HCT 32.0 (*)    All other components within normal limits    Imaging Review No results found. I have personally reviewed and evaluated these images and lab results as part of my medical decision-making.   EKG Interpretation None      MDM   Final diagnoses:  Paresthesia of arm    I personally performed the services described in this documentation, which was scribed in my presence. The recorded information has been reviewed and is accurate.   Electrolytes within normal limits. Questional carpal tunnel versus peripheral neuropraxia. Recommend following up with her physician. Return precautions given.  Loren Racer, MD 06/12/15 [phone]

## 2015-06-27 ENCOUNTER — Ambulatory Visit (HOSPITAL_COMMUNITY): Payer: 59

## 2015-07-08 ENCOUNTER — Ambulatory Visit (HOSPITAL_COMMUNITY)
Admission: RE | Admit: 2015-07-08 | Discharge: 2015-07-08 | Disposition: A | Discharge status: Home / Self Care | Payer: Medicaid Other | Source: Ambulatory Visit | Attending: Obstetrics & Gynecology | Admitting: Obstetrics & Gynecology

## 2015-07-08 DIAGNOSIS — Z3491 Encounter for supervision of normal pregnancy, unspecified, first trimester: Secondary | ICD-10-CM

## 2015-07-08 DIAGNOSIS — Z3A18 18 weeks gestation of pregnancy: Secondary | ICD-10-CM | POA: Insufficient documentation

## 2015-07-08 DIAGNOSIS — Z36 Encounter for antenatal screening of mother: Secondary | ICD-10-CM | POA: Diagnosis present

## 2015-07-23 ENCOUNTER — Encounter: Payer: Self-pay | Admitting: Obstetrics & Gynecology

## 2015-07-23 ENCOUNTER — Ambulatory Visit (INDEPENDENT_AMBULATORY_CARE_PROVIDER_SITE_OTHER): Payer: Medicaid Other | Admitting: Obstetrics & Gynecology

## 2015-07-23 VITALS — BP 110/75 | HR 105 | Wt 180.0 lb

## 2015-07-23 DIAGNOSIS — Z3402 Encounter for supervision of normal first pregnancy, second trimester: Secondary | ICD-10-CM

## 2015-07-23 DIAGNOSIS — Z36 Encounter for antenatal screening of mother: Secondary | ICD-10-CM | POA: Diagnosis not present

## 2015-07-23 DIAGNOSIS — Z3400 Encounter for supervision of normal first pregnancy, unspecified trimester: Secondary | ICD-10-CM

## 2015-07-23 DIAGNOSIS — Z3492 Encounter for supervision of normal pregnancy, unspecified, second trimester: Secondary | ICD-10-CM

## 2015-07-23 NOTE — Progress Notes (Signed)
Subjective:  Adrienne Griffin is a 24 y.o. G1P0 at 7157w3d being seen today for ongoing prenatal care.  She is currently monitored for the following issues for this low-risk pregnancy and has Supervision of normal pregnancy and Obesity in pregnancy, antepartum on her problem list.  Patient reports no complaints.  Contractions: Not present. Vag. Bleeding: None.  Movement: Present. Denies leaking of fluid.   The following portions of the patient's history were reviewed and updated as appropriate: allergies, current medications, past family history, past medical history, past social history, past surgical history and problem list. Problem list updated.  Objective:   Filed Vitals:   07/23/15 1104  BP: 110/75  Pulse: 105  Weight: 180 lb (81.647 kg)    Fetal Status: Fetal Heart Rate (bpm): 140   Movement: Present     General:  Alert, oriented and cooperative. Patient is in no acute distress.  Skin: Skin is warm and dry. No rash noted.   Cardiovascular: Normal heart rate noted  Respiratory: Normal respiratory effort, no problems with respiration noted  Abdomen: Soft, gravid, appropriate for gestational age. Pain/Pressure: Absent     Pelvic: Vag. Bleeding: None Vag D/C Character: Thin   Cervical exam deferred        Extremities: Normal range of motion.  Edema: None  Mental Status: Normal mood and affect. Normal behavior. Normal judgment and thought content.   Urinalysis:      Assessment and Plan:  Pregnancy: G1P0 at 6057w3d  1. Supervision of normal first pregnancy, antepartum  - Flu Vaccine QUAD 36+ mos IM (Fluarix, Quad PF)  2. Supervision of normal pregnancy, second trimester  - Alpha fetoprotein, maternal  Preterm labor symptoms and general obstetric precautions including but not limited to vaginal bleeding, contractions, leaking of fluid and fetal movement were reviewed in detail with the patient. Please refer to After Visit Summary for other counseling recommendations.   Return in about 8 weeks (around 09/17/2015).   Allie BossierMyra C Ellianne Gowen, MD

## 2015-07-25 LAB — ALPHA FETOPROTEIN, MATERNAL
AFP: 80.2 ng/mL
Curr Gest Age: 20.3 wks.days
MOM FOR AFP: 1.32
OPEN SPINA BIFIDA: NEGATIVE

## 2015-07-28 NOTE — L&D Delivery Note (Signed)
Delivery Note   The patient presented for post dates induction. She was induced with foley, cytotec, arom, and pitocin. She developed triple I during active labor and was treated with ampicillin and gentamicin. She pushed for approximately 4.5 hours. Decision made to proceed with vavd after patient began experiencing recurrent and prolonged late decels in setting of chorioamnionitis and pushing for > 4 hours. Verbal informed consent obtained, including discussion of risk of intracererbral hemorrhage, vaginal/perineal laceration, and shoulder dystocia. Initially bell vacuum placed but because of caput good suction was not achieved. Kiwi applied in a more posterior position. Pulled with approximately 10 contractions with two pop-offs of Bell. Terminal meconium. Body delivered with ease.   At 8:34 AM a viable female was delivered via Vaginal, Vacuum (Extractor) (Presentation: Left Occiput Anterior).  APGAR: , 9; weight 7 lb 9.5 oz (3445 g).   Placenta status: Intact, Spontaneous.  Cord: 3 vessels with the following complications: None.  Cord pH: not obtained  Anesthesia: Epidural  Episiotomy: None Lacerations: 2nd degree;Perineal; bilateral vaginal side-wall Suture Repair: 3.0 vicryl Est. Blood Loss (mL):    Mom to postpartum.  Baby to Couplet care / Skin to Skin.  Adrienne Griffin 12/15/2015, 10:20 AM

## 2015-09-17 ENCOUNTER — Ambulatory Visit (INDEPENDENT_AMBULATORY_CARE_PROVIDER_SITE_OTHER): Payer: Medicaid Other | Admitting: Obstetrics and Gynecology

## 2015-09-17 ENCOUNTER — Encounter: Payer: Self-pay | Admitting: Obstetrics and Gynecology

## 2015-09-17 VITALS — BP 109/75 | HR 98 | Wt 192.0 lb

## 2015-09-17 DIAGNOSIS — O99213 Obesity complicating pregnancy, third trimester: Secondary | ICD-10-CM

## 2015-09-17 DIAGNOSIS — Z36 Encounter for antenatal screening of mother: Secondary | ICD-10-CM | POA: Diagnosis not present

## 2015-09-17 DIAGNOSIS — Z23 Encounter for immunization: Secondary | ICD-10-CM

## 2015-09-17 DIAGNOSIS — Z3493 Encounter for supervision of normal pregnancy, unspecified, third trimester: Secondary | ICD-10-CM

## 2015-09-17 DIAGNOSIS — Z3483 Encounter for supervision of other normal pregnancy, third trimester: Secondary | ICD-10-CM

## 2015-09-17 DIAGNOSIS — E669 Obesity, unspecified: Secondary | ICD-10-CM

## 2015-09-17 LAB — CBC
HCT: 30.2 % — ABNORMAL LOW (ref 36.0–46.0)
Hemoglobin: 10.2 g/dL — ABNORMAL LOW (ref 12.0–15.0)
MCH: 28.1 pg (ref 26.0–34.0)
MCHC: 33.8 g/dL (ref 30.0–36.0)
MCV: 83.2 fL (ref 78.0–100.0)
MPV: 8.9 fL (ref 8.6–12.4)
PLATELETS: 285 10*3/uL (ref 150–400)
RBC: 3.63 MIL/uL — AB (ref 3.87–5.11)
RDW: 14.2 % (ref 11.5–15.5)
WBC: 8.4 10*3/uL (ref 4.0–10.5)

## 2015-09-17 NOTE — Progress Notes (Signed)
Pt is compliant with baby scripts.

## 2015-09-17 NOTE — Progress Notes (Signed)
Subjective:  Adrienne Griffin is a 25 y.o. G1P0 at [redacted]w[redacted]d being seen today for ongoing prenatal care.  She is currently monitored for the following issues for this low-risk pregnancy and has Supervision of normal pregnancy and Obesity in pregnancy, antepartum on her problem list.  Patient reports no complaints.  Contractions: Not present. Vag. Bleeding: None.  Movement: Present. Denies leaking of fluid.   The following portions of the patient's history were reviewed and updated as appropriate: allergies, current medications, past family history, past medical history, past social history, past surgical history and problem list. Problem list updated.  Objective:   Filed Vitals:   09/17/15 1037  BP: 109/75  Pulse: 98  Weight: 192 lb (87.091 kg)    Fetal Status: Fetal Heart Rate (bpm): 136   Movement: Present     General:  Alert, oriented and cooperative. Patient is in no acute distress.  Skin: Skin is warm and dry. No rash noted.   Cardiovascular: Normal heart rate noted  Respiratory: Normal respiratory effort, no problems with respiration noted  Abdomen: Soft, gravid, appropriate for gestational age. Pain/Pressure: Present     Pelvic: Vag. Bleeding: None Vag D/C Character: Thin   Cervical exam deferred        Extremities: Normal range of motion.  Edema: None  Mental Status: Normal mood and affect. Normal behavior. Normal judgment and thought content.   Urinalysis: Urine Protein: Trace Urine Glucose: Negative  Assessment and Plan:  Pregnancy: G1P0 at [redacted]w[redacted]d  1. Supervision of normal pregnancy in third trimester Patient is doing well  Third trimester labs and tdap today - Tdap vaccine greater than or equal to 7yo IM - CBC - HIV antibody - RPR - Glucose Tolerance, 1 HR (50g)  2. Supervision of normal pregnancy, third trimester Patient remains undecided on contraception  3. Obesity in pregnancy, antepartum, third trimester   Preterm labor symptoms and general obstetric  precautions including but not limited to vaginal bleeding, contractions, leaking of fluid and fetal movement were reviewed in detail with the patient. Please refer to After Visit Summary for other counseling recommendations.  Return in about 4 weeks (around 10/15/2015).   Catalina Antigua, MD

## 2015-09-18 ENCOUNTER — Telehealth: Payer: Self-pay | Admitting: *Deleted

## 2015-09-18 LAB — GLUCOSE TOLERANCE, 1 HOUR (50G) W/O FASTING: GLUCOSE, 1 HR, GESTATIONAL: 170 mg/dL — AB (ref <140)

## 2015-09-18 LAB — HIV ANTIBODY (ROUTINE TESTING W REFLEX): HIV 1&2 Ab, 4th Generation: NONREACTIVE

## 2015-09-18 NOTE — Telephone Encounter (Signed)
Informed pt of elevated 1 hr GTT and recommendation for 3 hr GTT, scheduled appt on 09-19-15 @ 0815.

## 2015-09-18 NOTE — Telephone Encounter (Signed)
-----   Message from Catalina Antigua, MD sent at 09/18/2015  8:51 AM EST ----- Please inform patient of abnormal 1 hour glucola. She needs to come in for a 3 hour test before her next appointment , ideally this week or next week  Thanks  Peggy

## 2015-09-19 ENCOUNTER — Other Ambulatory Visit (INDEPENDENT_AMBULATORY_CARE_PROVIDER_SITE_OTHER): Payer: Medicaid Other | Admitting: *Deleted

## 2015-09-19 DIAGNOSIS — R7302 Impaired glucose tolerance (oral): Secondary | ICD-10-CM

## 2015-09-19 DIAGNOSIS — R7309 Other abnormal glucose: Secondary | ICD-10-CM

## 2015-09-19 LAB — RPR

## 2015-09-19 NOTE — Progress Notes (Signed)
Pt here today for 3 hr GTT due to abnormal 1 hr GTT.

## 2015-09-20 LAB — GLUCOSE TOLERANCE, 3 HOURS
GLUCOSE 3 HOUR GTT: 108 mg/dL (ref <145)
Glucose Tolerance, 1 hour: 164 mg/dL (ref <190)
Glucose Tolerance, 2 hour: 148 mg/dL (ref <165)
Glucose Tolerance, Fasting: 72 mg/dL (ref 65–104)

## 2015-10-15 ENCOUNTER — Ambulatory Visit (INDEPENDENT_AMBULATORY_CARE_PROVIDER_SITE_OTHER): Payer: Medicaid Other | Admitting: Obstetrics & Gynecology

## 2015-10-15 VITALS — BP 116/81 | HR 97 | Wt 192.0 lb

## 2015-10-15 DIAGNOSIS — Z3483 Encounter for supervision of other normal pregnancy, third trimester: Secondary | ICD-10-CM

## 2015-10-15 DIAGNOSIS — Z3493 Encounter for supervision of normal pregnancy, unspecified, third trimester: Secondary | ICD-10-CM

## 2015-10-15 DIAGNOSIS — O99213 Obesity complicating pregnancy, third trimester: Secondary | ICD-10-CM

## 2015-10-15 DIAGNOSIS — E669 Obesity, unspecified: Secondary | ICD-10-CM

## 2015-10-15 MED ORDER — BREAST PUMP MISC
Status: DC
Start: 2015-10-15 — End: 2017-04-23

## 2015-10-15 NOTE — Progress Notes (Signed)
Subjective:  Adrienne CitrinCharlotte Konte Griffin is a 25 y.o. AA G1P0 (son) at 3736w3d being seen today for ongoing prenatal care.  She is currently monitored for the following issues for this low-risk pregnancy and has Supervision of normal pregnancy and Obesity in pregnancy, antepartum on her problem list.  Patient reports no complaints.  Contractions: Not present. Vag. Bleeding: None.  Movement: Present. Denies leaking of fluid.   The following portions of the patient's history were reviewed and updated as appropriate: allergies, current medications, past family history, past medical history, past social history, past surgical history and problem list. Problem list updated.  Objective:   Filed Vitals:   10/15/15 1025  BP: 116/81  Pulse: 97  Weight: 192 lb (87.091 kg)    Fetal Status: Fetal Heart Rate (bpm): 140   Movement: Present     General:  Alert, oriented and cooperative. Patient is in no acute distress.  Skin: Skin is warm and dry. No rash noted.   Cardiovascular: Normal heart rate noted  Respiratory: Normal respiratory effort, no problems with respiration noted  Abdomen: Soft, gravid, appropriate for gestational age. Pain/Pressure: Absent     Pelvic: Vag. Bleeding: None Vag D/C Character: Thin   Cervical exam deferred        Extremities: Normal range of motion.  Edema: Trace  Mental Status: Normal mood and affect. Normal behavior. Normal judgment and thought content.   Urinalysis: Urine Protein: Negative Urine Glucose: Negative  Assessment and Plan:  Pregnancy: G1P0 at 3936w3d  1. Supervision of normal pregnancy, third trimester -prescription for breast pump given - We discussed circumcison  2. Obesity in pregnancy, antepartum, third trimester   Preterm labor symptoms and general obstetric precautions including but not limited to vaginal bleeding, contractions, leaking of fluid and fetal movement were reviewed in detail with the patient. Please refer to After Visit Summary for other  counseling recommendations.  Return in about 4 weeks (around 11/12/2015).   Allie BossierMyra C Earle Troiano, MD

## 2015-11-01 ENCOUNTER — Encounter (HOSPITAL_COMMUNITY): Payer: Self-pay | Admitting: *Deleted

## 2015-11-01 ENCOUNTER — Inpatient Hospital Stay (HOSPITAL_COMMUNITY)
Admission: AD | Admit: 2015-11-01 | Discharge: 2015-11-01 | Disposition: A | Discharge status: Home / Self Care | Payer: Medicaid Other | Source: Ambulatory Visit | Attending: Obstetrics & Gynecology | Admitting: Obstetrics & Gynecology

## 2015-11-01 DIAGNOSIS — Z3493 Encounter for supervision of normal pregnancy, unspecified, third trimester: Secondary | ICD-10-CM

## 2015-11-01 DIAGNOSIS — R109 Unspecified abdominal pain: Secondary | ICD-10-CM | POA: Insufficient documentation

## 2015-11-01 DIAGNOSIS — O26893 Other specified pregnancy related conditions, third trimester: Secondary | ICD-10-CM | POA: Diagnosis not present

## 2015-11-01 DIAGNOSIS — R1033 Periumbilical pain: Secondary | ICD-10-CM

## 2015-11-01 DIAGNOSIS — Z3A34 34 weeks gestation of pregnancy: Secondary | ICD-10-CM | POA: Diagnosis not present

## 2015-11-01 DIAGNOSIS — O9989 Other specified diseases and conditions complicating pregnancy, childbirth and the puerperium: Secondary | ICD-10-CM | POA: Diagnosis not present

## 2015-11-01 NOTE — Discharge Instructions (Signed)

## 2015-11-01 NOTE — MAU Provider Note (Signed)
First Provider Initiated Contact with Patient 11/01/15 0100      Chief Complaint:  Abdominal Pain   Adrienne Griffin is  25 y.o. G1P0 at 2054w6d presents complaining of Abdominal Pain . For the past two days, she has had discomfort in her abdomen that "is sometimes there, sometimes it;s not."  She feels it when she touches her belly, lateral and superior to umbilicus.  No other associated sx.   Obstetrical/Gynecological History: OB History    Gravida Para Term Preterm AB TAB SAB Ectopic Multiple Living   1              Past Medical History: Past Medical History  Diagnosis Date  . Medical history non-contributory     Past Surgical History: Past Surgical History  Procedure Laterality Date  . Wisdom tooth removal  2010    Family History: Family History  Problem Relation Age of Onset  . Hypertension Mother   . Diabetes Mother     Social History: Social History  Substance Use Topics  . Smoking status: Never Smoker   . Smokeless tobacco: None  . Alcohol Use: Yes     Comment: occasional pre-pregnancy    Allergies: No Known Allergies  Meds:  Prescriptions prior to admission  Medication Sig Dispense Refill Last Dose  . Misc. Devices (BREAST PUMP) MISC Dispense one breast pump for patient 1 each 0   . Prenatal Multivit-Min-Fe-FA (PRENATAL VITAMINS PO) Take 1 tablet by mouth daily.    Taking    Review of Systems   Constitutional: Negative for fever and chills Eyes: Negative for visual disturbances Respiratory: Negative for shortness of breath, dyspnea Cardiovascular: Negative for chest pain or palpitations  Gastrointestinal: Negative for vomiting, diarrhea and constipation Genitourinary: Negative for dysuria and urgency Musculoskeletal: Negative for back pain, joint pain, myalgias.  Normal ROM  Neurological: Negative for dizziness and headaches    Physical Exam  Blood pressure 114/79, pulse 96, temperature 98.2 F (36.8 C), temperature source Oral, resp. rate  18, height 4\' 11"  (1.499 m), weight 88.055 kg (194 lb 2 oz), last menstrual period 03/02/2015. GENERAL: Well-developed, well-nourished female in no acute distress.  LUNGS: Clear to auscultation bilaterally.  HEART: Regular rate and rhythm. ABDOMEN: Soft, nondistended, gravid. Slightly tender to light/moderate palpation in area described above.  No rebound or guarding.  EXTREMITIES: Nontender, no edema, 2+ distal pulses. DTR's 2+ CERVICAL EXAM: Dilatation 0cm   Effacement 0%   Station ballotable   Presentation: cephalic FHT:  Baseline rate 150 bpm   Variability moderate  Accelerations present   Decelerations none Contractions: rare, not felt by pt   Labs: No results found for this or any previous visit (from the past 24 hour(s)). Imaging Studies:  No results found.  Assessment: Adrienne Griffin is  25 y.o. G1P0 at 6954w6d presents with abdominal wall discomfort.  Plan: Ice prn  CRESENZO-DISHMAN,Zayvon Alicea 4/7/20171:11 AM

## 2015-11-01 NOTE — MAU Note (Signed)
PT  SAYS SHE  HAS  ABD  PAIN ONLY ON RIGHT  SIDE -  STARTED YESTERDAY.      BABY MOVING.     NO VE.    GBS-   NOT  COLLECTED YET.

## 2015-11-12 ENCOUNTER — Other Ambulatory Visit (HOSPITAL_COMMUNITY)
Admission: RE | Admit: 2015-11-12 | Discharge: 2015-11-12 | Disposition: A | Discharge status: Home / Self Care | Payer: Medicaid Other | Source: Ambulatory Visit | Attending: Obstetrics and Gynecology | Admitting: Obstetrics and Gynecology

## 2015-11-12 ENCOUNTER — Ambulatory Visit (INDEPENDENT_AMBULATORY_CARE_PROVIDER_SITE_OTHER): Payer: Medicaid Other | Admitting: Obstetrics and Gynecology

## 2015-11-12 ENCOUNTER — Encounter: Payer: Self-pay | Admitting: Obstetrics and Gynecology

## 2015-11-12 VITALS — BP 113/78 | HR 88 | Wt 194.0 lb

## 2015-11-12 DIAGNOSIS — Z36 Encounter for antenatal screening of mother: Secondary | ICD-10-CM | POA: Diagnosis not present

## 2015-11-12 DIAGNOSIS — Z113 Encounter for screening for infections with a predominantly sexual mode of transmission: Secondary | ICD-10-CM

## 2015-11-12 DIAGNOSIS — Z3403 Encounter for supervision of normal first pregnancy, third trimester: Secondary | ICD-10-CM

## 2015-11-12 DIAGNOSIS — Z3493 Encounter for supervision of normal pregnancy, unspecified, third trimester: Secondary | ICD-10-CM

## 2015-11-12 DIAGNOSIS — O99213 Obesity complicating pregnancy, third trimester: Secondary | ICD-10-CM

## 2015-11-12 DIAGNOSIS — E669 Obesity, unspecified: Secondary | ICD-10-CM

## 2015-11-12 LAB — OB RESULTS CONSOLE GC/CHLAMYDIA: GC PROBE AMP, GENITAL: NEGATIVE

## 2015-11-12 LAB — OB RESULTS CONSOLE GBS: GBS: NEGATIVE

## 2015-11-12 NOTE — Progress Notes (Signed)
Subjective:  Adrienne Griffin is a 25 y.o. G1P0 at [redacted]w[redacted]d being seen today for ongoing prenatal care.  She is currently monitored for the following issues for this low-risk pregnancy and has Supervision of normal pregnancy and Obesity in pregnancy, antepartum on her problem list.  Patient reports no complaints.  Contractions: Irregular. Vag. Bleeding: None.  Movement: Present. Denies leaking of fluid.   The following portions of the patient's history were reviewed and updated as appropriate: allergies, current medications, past family history, past medical history, past social history, past surgical history and problem list. Problem list updated.  Objective:   Filed Vitals:   11/12/15 1024  BP: 113/78  Pulse: 88  Weight: 194 lb (87.998 kg)    Fetal Status: Fetal Heart Rate (bpm): 142 Fundal Height: 36 cm Movement: Present  Presentation: Vertex  General:  Alert, oriented and cooperative. Patient is in no acute distress.  Skin: Skin is warm and dry. No rash noted.   Cardiovascular: Normal heart rate noted  Respiratory: Normal respiratory effort, no problems with respiration noted  Abdomen: Soft, gravid, appropriate for gestational age. Pain/Pressure: Present     Pelvic: Vag. Bleeding: None Vag D/C Character: Thin   Cervical exam performed Dilation: Closed Effacement (%): Thick Station: Ballotable  Extremities: Normal range of motion.  Edema: Trace  Mental Status: Normal mood and affect. Normal behavior. Normal judgment and thought content.   Urinalysis: Urine Protein: Negative Urine Glucose: Negative  Assessment and Plan:  Pregnancy: G1P0 at 244w3d  1. Supervision of normal pregnancy, third trimester Patient is doing well Cultures today - Culture, beta strep (group b only) - GC/Chlamydia probe amp ([place]  2. Obesity in pregnancy, antepartum, third trimester   Preterm labor symptoms and general obstetric precautions including but not limited to vaginal  bleeding, contractions, leaking of fluid and fetal movement were reviewed in detail with the patient. Please refer to After Visit Summary for other counseling recommendations.  Return in about 2 weeks (around 11/26/2015).   Catalina AntiguaPeggy Kabir Brannock, MD

## 2015-11-13 LAB — GC/CHLAMYDIA PROBE AMP (~~LOC~~) NOT AT ARMC
Chlamydia: NEGATIVE
NEISSERIA GONORRHEA: NEGATIVE

## 2015-11-15 LAB — CULTURE, BETA STREP (GROUP B ONLY)

## 2015-11-26 ENCOUNTER — Ambulatory Visit (INDEPENDENT_AMBULATORY_CARE_PROVIDER_SITE_OTHER): Payer: Medicaid Other | Admitting: Obstetrics & Gynecology

## 2015-11-26 VITALS — BP 113/76 | HR 87 | Wt 198.0 lb

## 2015-11-26 DIAGNOSIS — E669 Obesity, unspecified: Secondary | ICD-10-CM

## 2015-11-26 DIAGNOSIS — O99213 Obesity complicating pregnancy, third trimester: Secondary | ICD-10-CM

## 2015-11-26 DIAGNOSIS — Z3403 Encounter for supervision of normal first pregnancy, third trimester: Secondary | ICD-10-CM

## 2015-11-26 DIAGNOSIS — Z3493 Encounter for supervision of normal pregnancy, unspecified, third trimester: Secondary | ICD-10-CM

## 2015-11-26 NOTE — Progress Notes (Signed)
Subjective:  Adrienne Griffin is a [age] y.o. AA  G1P0 at 1776w3d being seen today for ongoing prenatal care.  She is currently monitored for the following issues for this low-risk pregnancy and has Supervision of normal pregnancy and Obesity in pregnancy, antepartum on her problem list.  Patient reports no complaints.  Contractions: Irregular. Vag. Bleeding: None.  Movement: Present. Denies leaking of fluid.   The following portions of the patient's history were reviewed and updated as appropriate: allergies, current medications, past family history, past medical history, past social history, past surgical history and problem list. Problem list updated.  Objective:   Filed Vitals:   11/26/15 0959  BP: 113/76  Pulse: 87  Weight: 198 lb (89.812 kg)    Fetal Status: Fetal Heart Rate (bpm): 132   Movement: Present     General:  Alert, oriented and cooperative. Patient is in no acute distress.  Skin: Skin is warm and dry. No rash noted.   Cardiovascular: Normal heart rate noted  Respiratory: Normal respiratory effort, no problems with respiration noted  Abdomen: Soft, gravid, appropriate for gestational age. Pain/Pressure: Present     Pelvic: Vag. Bleeding: None Vag D/C Character: Thin   Cervical exam performed        Extremities: Normal range of motion.  Edema: Trace  Mental Status: Normal mood and affect. Normal behavior. Normal judgment and thought content.   Urinalysis:      Assessment and Plan:  Pregnancy: G1P0 at 4876w3d  1. Supervision of normal pregnancy, third trimester   2. Obesity in pregnancy, antepartum, third trimester   Term labor symptoms and general obstetric precautions including but not limited to vaginal bleeding, contractions, leaking of fluid and fetal movement were reviewed in detail with the patient. Please refer to After Visit Summary for other counseling recommendations.  No Follow-up on file.   Allie BossierMyra C Elier Zellars, MD

## 2015-11-28 ENCOUNTER — Inpatient Hospital Stay (HOSPITAL_COMMUNITY)
Admission: AD | Admit: 2015-11-28 | Discharge: 2015-11-28 | Disposition: A | Discharge status: Home / Self Care | Payer: Medicaid Other | Source: Ambulatory Visit | Attending: Obstetrics & Gynecology | Admitting: Obstetrics & Gynecology

## 2015-11-28 ENCOUNTER — Encounter: Payer: Self-pay | Admitting: *Deleted

## 2015-11-28 ENCOUNTER — Encounter (HOSPITAL_COMMUNITY): Payer: Self-pay

## 2015-11-28 DIAGNOSIS — Z3A38 38 weeks gestation of pregnancy: Secondary | ICD-10-CM | POA: Insufficient documentation

## 2015-11-28 NOTE — MAU Note (Signed)
Notified provider that patient is here for a labor eval. 1.5/60 on cervical exam. No ctxs. Provider said patient can be discharged when strip is reactive.

## 2015-11-28 NOTE — MAU Note (Signed)
Patient presents with c/o ctx. Patient denies any bleeding of LOF. Fetus active.

## 2015-11-28 NOTE — Discharge Instructions (Signed)
Fetal Movement Counts Patient Name: __________________________________________________ Patient Due Date: ____________________ Performing a fetal movement count is highly recommended in high-risk pregnancies, but it is good for every pregnant woman to do. Your health care provider may ask you to start counting fetal movements at 28 weeks of the pregnancy. Fetal movements often increase:  After eating a full meal.  After physical activity.  After eating or drinking something sweet or cold.  At rest. Pay attention to when you feel the baby is most active. This will help you notice a pattern of your baby's sleep and wake cycles and what factors contribute to an increase in fetal movement. It is important to perform a fetal movement count at the same time each day when your baby is normally most active.  HOW TO COUNT FETAL MOVEMENTS 1. Find a quiet and comfortable area to sit or lie down on your left side. Lying on your left side provides the best blood and oxygen circulation to your baby. 2. Write down the day and time on a sheet of paper or in a journal. 3. Start counting kicks, flutters, swishes, rolls, or jabs in a 2-hour period. You should feel at least 10 movements within 2 hours. 4. If you do not feel 10 movements in 2 hours, wait 2-3 hours and count again. Look for a change in the pattern or not enough counts in 2 hours. SEEK MEDICAL CARE IF:  You feel less than 10 counts in 2 hours, tried twice.  There is no movement in over an hour.  The pattern is changing or taking longer each day to reach 10 counts in 2 hours.  You feel the baby is not moving as he or she usually does. Date: ____________ Movements: ____________ Start time: ____________ Finish time: ____________  Date: ____________ Movements: ____________ Start time: ____________ Finish time: ____________ Date: ____________ Movements: ____________ Start time: ____________ Finish time: ____________ Date: ____________ Movements:  ____________ Start time: ____________ Finish time: ____________ Date: ____________ Movements: ____________ Start time: ____________ Finish time: ____________ Date: ____________ Movements: ____________ Start time: ____________ Finish time: ____________ Date: ____________ Movements: ____________ Start time: ____________ Finish time: ____________ Date: ____________ Movements: ____________ Start time: ____________ Finish time: ____________  Date: ____________ Movements: ____________ Start time: ____________ Finish time: ____________ Date: ____________ Movements: ____________ Start time: ____________ Finish time: ____________ Date: ____________ Movements: ____________ Start time: ____________ Finish time: ____________ Date: ____________ Movements: ____________ Start time: ____________ Finish time: ____________ Date: ____________ Movements: ____________ Start time: ____________ Finish time: ____________ Date: ____________ Movements: ____________ Start time: ____________ Finish time: ____________ Date: ____________ Movements: ____________ Start time: ____________ Finish time: ____________  Date: ____________ Movements: ____________ Start time: ____________ Finish time: ____________ Date: ____________ Movements: ____________ Start time: ____________ Finish time: ____________ Date: ____________ Movements: ____________ Start time: ____________ Finish time: ____________ Date: ____________ Movements: ____________ Start time: ____________ Finish time: ____________ Date: ____________ Movements: ____________ Start time: ____________ Finish time: ____________ Date: ____________ Movements: ____________ Start time: ____________ Finish time: ____________ Date: ____________ Movements: ____________ Start time: ____________ Finish time: ____________  Date: ____________ Movements: ____________ Start time: ____________ Finish time: ____________ Date: ____________ Movements: ____________ Start time: ____________ Finish  time: ____________ Date: ____________ Movements: ____________ Start time: ____________ Finish time: ____________ Date: ____________ Movements: ____________ Start time: ____________ Finish time: ____________ Date: ____________ Movements: ____________ Start time: ____________ Finish time: ____________ Date: ____________ Movements: ____________ Start time: ____________ Finish time: ____________ Date: ____________ Movements: ____________ Start time: ____________ Finish time: ____________  Date: ____________ Movements: ____________ Start time: ____________ Finish   time: ____________ Date: ____________ Movements: ____________ Start time: ____________ Finish time: ____________ Date: ____________ Movements: ____________ Start time: ____________ Finish time: ____________ Date: ____________ Movements: ____________ Start time: ____________ Finish time: ____________ Date: ____________ Movements: ____________ Start time: ____________ Finish time: ____________ Date: ____________ Movements: ____________ Start time: ____________ Finish time: ____________ Date: ____________ Movements: ____________ Start time: ____________ Finish time: ____________  Date: ____________ Movements: ____________ Start time: ____________ Finish time: ____________ Date: ____________ Movements: ____________ Start time: ____________ Finish time: ____________ Date: ____________ Movements: ____________ Start time: ____________ Finish time: ____________ Date: ____________ Movements: ____________ Start time: ____________ Finish time: ____________ Date: ____________ Movements: ____________ Start time: ____________ Finish time: ____________ Date: ____________ Movements: ____________ Start time: ____________ Finish time: ____________ Date: ____________ Movements: ____________ Start time: ____________ Finish time: ____________  Date: ____________ Movements: ____________ Start time: ____________ Finish time: ____________ Date: ____________  Movements: ____________ Start time: ____________ Finish time: ____________ Date: ____________ Movements: ____________ Start time: ____________ Finish time: ____________ Date: ____________ Movements: ____________ Start time: ____________ Finish time: ____________ Date: ____________ Movements: ____________ Start time: ____________ Finish time: ____________ Date: ____________ Movements: ____________ Start time: ____________ Finish time: ____________ Date: ____________ Movements: ____________ Start time: ____________ Finish time: ____________  Date: ____________ Movements: ____________ Start time: ____________ Finish time: ____________ Date: ____________ Movements: ____________ Start time: ____________ Finish time: ____________ Date: ____________ Movements: ____________ Start time: ____________ Finish time: ____________ Date: ____________ Movements: ____________ Start time: ____________ Finish time: ____________ Date: ____________ Movements: ____________ Start time: ____________ Finish time: ____________ Date: ____________ Movements: ____________ Start time: ____________ Finish time: ____________   This information is not intended to replace advice given to you by your health care provider. Make sure you discuss any questions you have with your health care provider.   Document Released: 08/12/2006 Document Revised: 08/03/2014 Document Reviewed: 05/09/2012 Elsevier Interactive Patient Education 2016 [employer] Braxton Hicks Contractions Contractions of the uterus can occur throughout pregnancy. Contractions are not always a sign that you are in labor.  WHAT ARE BRAXTON HICKS CONTRACTIONS?  Contractions that occur before labor are called Braxton Hicks contractions, or false labor. Toward the end of pregnancy (32-34 weeks), these contractions can develop more often and may become more forceful. This is not true labor because these contractions do not result in opening (dilatation) and thinning of  the cervix. They are sometimes difficult to tell apart from true labor because these contractions can be forceful and people have different pain tolerances. You should not feel embarrassed if you go to the hospital with false labor. Sometimes, the only way to tell if you are in true labor is for your health care provider to look for changes in the cervix. If there are no prenatal problems or other health problems associated with the pregnancy, it is completely safe to be sent home with false labor and await the onset of true labor. HOW CAN YOU TELL THE DIFFERENCE BETWEEN TRUE AND FALSE LABOR? False Labor  The contractions of false labor are usually shorter and not as hard as those of true labor.   The contractions are usually irregular.   The contractions are often felt in the front of the lower abdomen and in the groin.   The contractions may go away when you walk around or change positions while lying down.   The contractions get weaker and are shorter lasting as time goes on.   The contractions do not usually become progressively stronger, regular, and closer together as with true labor.  True Labor 5. Contractions in true   labor last 30-70 seconds, become very regular, usually become more intense, and increase in frequency.  6. The contractions do not go away with walking.  7. The discomfort is usually felt in the top of the uterus and spreads to the lower abdomen and low back.  8. True labor can be determined by your health care provider with an exam. This will show that the cervix is dilating and getting thinner.  WHAT TO REMEMBER  Keep up with your usual exercises and follow other instructions given by your health care provider.   Take medicines as directed by your health care provider.   Keep your regular prenatal appointments.   Eat and drink lightly if you think you are going into labor.   If Braxton Hicks contractions are making you uncomfortable:   Change  your position from lying down or resting to walking, or from walking to resting.   Sit and rest in a tub of warm water.   Drink 2-3 glasses of water. Dehydration may cause these contractions.   Do slow and deep breathing several times an hour.  WHEN SHOULD I SEEK IMMEDIATE MEDICAL CARE? Seek immediate medical care if:  Your contractions become stronger, more regular, and closer together.   You have fluid leaking or gushing from your vagina.   You have a fever.   You pass blood-tinged mucus.   You have vaginal bleeding.   You have continuous abdominal pain.   You have low back pain that you never had before.   You feel your baby's head pushing down and causing pelvic pressure.   Your baby is not moving as much as it used to.    This information is not intended to replace advice given to you by your health care provider. Make sure you discuss any questions you have with your health care provider.   Document Released: 07/13/2005 Document Revised: 07/18/2013 Document Reviewed: 04/24/2013 Elsevier Interactive Patient Education 2016 [employer] Inc[employer]  

## 2015-12-03 ENCOUNTER — Ambulatory Visit (INDEPENDENT_AMBULATORY_CARE_PROVIDER_SITE_OTHER): Payer: Medicaid Other | Admitting: Family Medicine

## 2015-12-03 VITALS — BP 118/84 | HR 87 | Wt 197.0 lb

## 2015-12-03 DIAGNOSIS — Z3493 Encounter for supervision of normal pregnancy, unspecified, third trimester: Secondary | ICD-10-CM

## 2015-12-03 DIAGNOSIS — Z3403 Encounter for supervision of normal first pregnancy, third trimester: Secondary | ICD-10-CM

## 2015-12-03 NOTE — Progress Notes (Signed)
Subjective:  Adrienne Griffin is a 25 y.o. G1P0 at 467w3d being seen today for ongoing prenatal care.  She is currently monitored for the following issues for this low-risk pregnancy and has Supervision of normal pregnancy and Obesity in pregnancy, antepartum on her problem list.  Patient reports no complaints.  Contractions: Irregular. Vag. Bleeding: None.  Movement: Present. Denies leaking of fluid.   The following portions of the patient's history were reviewed and updated as appropriate: allergies, current medications, past family history, past medical history, past social history, past surgical history and problem list. Problem list updated.  Objective:   Filed Vitals:   12/03/15 1028  BP: 118/84  Pulse: 87  Weight: 197 lb (89.359 kg)    Fetal Status: Fetal Heart Rate (bpm): 135 Fundal Height: 38 cm Movement: Present  Presentation: Vertex  General:  Alert, oriented and cooperative. Patient is in no acute distress.  Skin: Skin is warm and dry. No rash noted.   Cardiovascular: Normal heart rate noted  Respiratory: Normal respiratory effort, no problems with respiration noted  Abdomen: Soft, gravid, appropriate for gestational age. Pain/Pressure: Present     Pelvic: Vag. Bleeding: None Vag D/C Character: Mucous   Cervical exam performed Dilation: 1 Effacement (%): 60 Station: Ballotable  Extremities: Normal range of motion.  Edema: Trace  Mental Status: Normal mood and affect. Normal behavior. Normal judgment and thought content.   Urinalysis: Urine Protein: Trace Urine Glucose: Negative  Assessment and Plan:  Pregnancy: G1P0 at 2667w3d  1. Supervision of normal pregnancy, third trimester Labor precautions reviewed  Term labor symptoms and general obstetric precautions including but not limited to vaginal bleeding, contractions, leaking of fluid and fetal movement were reviewed in detail with the patient. Please refer to After Visit Summary for other counseling  recommendations.  Return in 1 week (on 12/10/2015) for OB visit and NST.   Adrienne Boresanya S Caven Perine, MD

## 2015-12-03 NOTE — Patient Instructions (Signed)
Third Trimester of Pregnancy The third trimester is from week 29 through week 42, months 7 through 9. The third trimester is a time when the fetus is growing rapidly. At the end of the ninth month, the fetus is about 20 inches in length and weighs 6-10 pounds.  BODY CHANGES Your body goes through many changes during pregnancy. The changes vary from woman to woman.   Your weight will continue to increase. You can expect to gain 25-35 pounds (11-16 kg) by the end of the pregnancy.  You may begin to get stretch marks on your hips, abdomen, and breasts.  You may urinate more often because the fetus is moving lower into your pelvis and pressing on your bladder.  You may develop or continue to have heartburn as a result of your pregnancy.  You may develop constipation because certain hormones are causing the muscles that push waste through your intestines to slow down.  You may develop hemorrhoids or swollen, bulging veins (varicose veins).  You may have pelvic pain because of the weight gain and pregnancy hormones relaxing your joints between the bones in your pelvis. Backaches may result from overexertion of the muscles supporting your posture.  You may have changes in your hair. These can include thickening of your hair, rapid growth, and changes in texture. Some women also have hair loss during or after pregnancy, or hair that feels dry or thin. Your hair will most likely return to normal after your baby is born.  Your breasts will continue to grow and be tender. A yellow discharge may leak from your breasts called colostrum.  Your belly button may stick out.  You may feel short of breath because of your expanding uterus.  You may notice the fetus "dropping," or moving lower in your abdomen.  You may have a bloody mucus discharge. This usually occurs a few days to a week before labor begins.  Your cervix becomes thin and soft (effaced) near your due date. WHAT TO EXPECT AT YOUR  PRENATAL EXAMS  You will have prenatal exams every 2 weeks until week 36. Then, you will have weekly prenatal exams. During a routine prenatal visit:  You will be weighed to make sure you and the fetus are growing normally.  Your blood pressure is taken.  Your abdomen will be measured to track your baby's growth.  The fetal heartbeat will be listened to.  Any test results from the previous visit will be discussed.  You may have a cervical check near your due date to see if you have effaced. At around 36 weeks, your caregiver will check your cervix. At the same time, your caregiver will also perform a test on the secretions of the vaginal tissue. This test is to determine if a type of bacteria, Group B streptococcus, is present. Your caregiver will explain this further. Your caregiver may ask you:  What your birth plan is.  How you are feeling.  If you are feeling the baby move.  If you have had any abnormal symptoms, such as leaking fluid, bleeding, severe headaches, or abdominal cramping.  If you are using any tobacco products, including cigarettes, chewing tobacco, and electronic cigarettes.  If you have any questions. Other tests or screenings that may be performed during your third trimester include:  Blood tests that check for low iron levels (anemia).  Fetal testing to check the health, activity level, and growth of the fetus. Testing is done if you have certain medical conditions or if   there are problems during the pregnancy.  HIV (human immunodeficiency virus) testing. If you are at high risk, you may be screened for HIV during your third trimester of pregnancy. FALSE LABOR You may feel small, irregular contractions that eventually go away. These are called Braxton Hicks contractions, or false labor. Contractions may last for hours, days, or even weeks before true labor sets in. If contractions come at regular intervals, intensify, or become painful, it is best to be seen  by your caregiver.  SIGNS OF LABOR   Menstrual-like cramps.  Contractions that are 5 minutes apart or less.  Contractions that start on the top of the uterus and spread down to the lower abdomen and back.  A sense of increased pelvic pressure or back pain.  A watery or bloody mucus discharge that comes from the vagina. If you have any of these signs before the 37th week of pregnancy, call your caregiver right away. You need to go to the hospital to get checked immediately. HOME CARE INSTRUCTIONS   Avoid all smoking, herbs, alcohol, and unprescribed drugs. These chemicals affect the formation and growth of the baby.  Do not use any tobacco products, including cigarettes, chewing tobacco, and electronic cigarettes. If you need help quitting, ask your health care provider. You may receive counseling support and other resources to help you quit.  Follow your caregiver's instructions regarding medicine use. There are medicines that are either safe or unsafe to take during pregnancy.  Exercise only as directed by your caregiver. Experiencing uterine cramps is a good sign to stop exercising.  Continue to eat regular, healthy meals.  Wear a good support bra for breast tenderness.  Do not use hot tubs, steam rooms, or saunas.  Wear your seat belt at all times when driving.  Avoid raw meat, uncooked cheese, cat litter boxes, and soil used by cats. These carry germs that can cause birth defects in the baby.  Take your prenatal vitamins.  Take 1500-2000 mg of calcium daily starting at the 20th week of pregnancy until you deliver your baby.  Try taking a stool softener (if your caregiver approves) if you develop constipation. Eat more high-fiber foods, such as fresh vegetables or fruit and whole grains. Drink plenty of fluids to keep your urine clear or pale yellow.  Take warm sitz baths to soothe any pain or discomfort caused by hemorrhoids. Use hemorrhoid cream if your caregiver  approves.  If you develop varicose veins, wear support hose. Elevate your feet for 15 minutes, 3-4 times a day. Limit salt in your diet.  Avoid heavy lifting, wear low heal shoes, and practice good posture.  Rest a lot with your legs elevated if you have leg cramps or low back pain.  Visit your dentist if you have not gone during your pregnancy. Use a soft toothbrush to brush your teeth and be gentle when you floss.  A sexual relationship may be continued unless your caregiver directs you otherwise.  Do not travel far distances unless it is absolutely necessary and only with the approval of your caregiver.  Take prenatal classes to understand, practice, and ask questions about the labor and delivery.  Make a trial run to the hospital.  Pack your hospital bag.  Prepare the baby's nursery.  Continue to go to all your prenatal visits as directed by your caregiver. SEEK MEDICAL CARE IF:  You are unsure if you are in labor or if your water has broken.  You have dizziness.  You have   mild pelvic cramps, pelvic pressure, or nagging pain in your abdominal area.  You have persistent nausea, vomiting, or diarrhea.  You have a bad smelling vaginal discharge.  You have pain with urination. SEEK IMMEDIATE MEDICAL CARE IF:   You have a fever.  You are leaking fluid from your vagina.  You have spotting or bleeding from your vagina.  You have severe abdominal cramping or pain.  You have rapid weight loss or gain.  You have shortness of breath with chest pain.  You notice sudden or extreme swelling of your face, hands, ankles, feet, or legs.  You have not felt your baby move in over an hour.  You have severe headaches that do not go away with medicine.  You have vision changes.   This information is not intended to replace advice given to you by your health care provider. Make sure you discuss any questions you have with your health care provider.   Document Released:  07/07/2001 Document Revised: 08/03/2014 Document Reviewed: 09/13/2012 Elsevier Interactive Patient Education 2016 [employer]  Breastfeeding Deciding to breastfeed is one of the best choices you can make for you and your baby. A change in hormones during pregnancy causes your breast tissue to grow and increases the number and size of your milk ducts. These hormones also allow proteins, sugars, and fats from your blood supply to make breast milk in your milk-producing glands. Hormones prevent breast milk from being released before your baby is born as well as prompt milk flow after birth. Once breastfeeding has begun, thoughts of your baby, as well as his or her sucking or crying, can stimulate the release of milk from your milk-producing glands.  BENEFITS OF BREASTFEEDING For Your Baby  Your first milk (colostrum) helps your baby's digestive system function better.  There are antibodies in your milk that help your baby fight off infections.  Your baby has a lower incidence of asthma, allergies, and sudden infant death syndrome.  The nutrients in breast milk are better for your baby than infant formulas and are designed uniquely for your baby's needs.  Breast milk improves your baby's brain development.  Your baby is less likely to develop other conditions, such as childhood obesity, asthma, or type 2 diabetes mellitus. For You  Breastfeeding helps to create a very special bond between you and your baby.  Breastfeeding is convenient. Breast milk is always available at the correct temperature and costs nothing.  Breastfeeding helps to burn calories and helps you lose the weight gained during pregnancy.  Breastfeeding makes your uterus contract to its prepregnancy size faster and slows bleeding (lochia) after you give birth.   Breastfeeding helps to lower your risk of developing type 2 diabetes mellitus, osteoporosis, and breast or ovarian cancer later in life. SIGNS THAT YOUR BABY IS  HUNGRY Early Signs of Hunger  Increased alertness or activity.  Stretching.  Movement of the head from side to side.  Movement of the head and opening of the mouth when the corner of the mouth or cheek is stroked (rooting).  Increased sucking sounds, smacking lips, cooing, sighing, or squeaking.  Hand-to-mouth movements.  Increased sucking of fingers or hands. Late Signs of Hunger  Fussing.  Intermittent crying. Extreme Signs of Hunger Signs of extreme hunger will require calming and consoling before your baby will be able to breastfeed successfully. Do not wait for the following signs of extreme hunger to occur before you initiate breastfeeding:  Restlessness.  A loud, strong cry.  Screaming.   BREASTFEEDING BASICS Breastfeeding Initiation  Find a comfortable place to sit or lie down, with your neck and back well supported.  Place a pillow or rolled up blanket under your baby to bring him or her to the level of your breast (if you are seated). Nursing pillows are specially designed to help support your arms and your baby while you breastfeed.  Make sure that your baby's abdomen is facing your abdomen.  Gently massage your breast. With your fingertips, massage from your chest wall toward your nipple in a circular motion. This encourages milk flow. You may need to continue this action during the feeding if your milk flows slowly.  Support your breast with 4 fingers underneath and your thumb above your nipple. Make sure your fingers are well away from your nipple and your baby's mouth.  Stroke your baby's lips gently with your finger or nipple.  When your baby's mouth is open wide enough, quickly bring your baby to your breast, placing your entire nipple and as much of the colored area around your nipple (areola) as possible into your baby's mouth.  More areola should be visible above your baby's upper lip than below the lower lip.  Your baby's tongue should be between his  or her lower gum and your breast.  Ensure that your baby's mouth is correctly positioned around your nipple (latched). Your baby's lips should create a seal on your breast and be turned out (everted).  It is common for your baby to suck about 2-3 minutes in order to start the flow of breast milk. Latching Teaching your baby how to latch on to your breast properly is very important. An improper latch can cause nipple pain and decreased milk supply for you and poor weight gain in your baby. Also, if your baby is not latched onto your nipple properly, he or she may swallow some air during feeding. This can make your baby fussy. Burping your baby when you switch breasts during the feeding can help to get rid of the air. However, teaching your baby to latch on properly is still the best way to prevent fussiness from swallowing air while breastfeeding. Signs that your baby has successfully latched on to your nipple:  Silent tugging or silent sucking, without causing you pain.  Swallowing heard between every 3-4 sucks.  Muscle movement above and in front of his or her ears while sucking. Signs that your baby has not successfully latched on to nipple:  Sucking sounds or smacking sounds from your baby while breastfeeding.  Nipple pain. If you think your baby has not latched on correctly, slip your finger into the corner of your baby's mouth to break the suction and place it between your baby's gums. Attempt breastfeeding initiation again. Signs of Successful Breastfeeding Signs from your baby:  A gradual decrease in the number of sucks or complete cessation of sucking.  Falling asleep.  Relaxation of his or her body.  Retention of a small amount of milk in his or her mouth.  Letting go of your breast by himself or herself. Signs from you:  Breasts that have increased in firmness, weight, and size 1-3 hours after feeding.  Breasts that are softer immediately after  breastfeeding.  Increased milk volume, as well as a change in milk consistency and color by the fifth day of breastfeeding.  Nipples that are not sore, cracked, or bleeding. Signs That Your Baby is Getting Enough Milk  Wetting at least 3 diapers in a 24-hour period.   The urine should be clear and pale yellow by age 5 days.  At least 3 stools in a 24-hour period by age 5 days. The stool should be soft and yellow.  At least 3 stools in a 24-hour period by age 7 days. The stool should be seedy and yellow.  No loss of weight greater than 10% of birth weight during the first 3 days of age.  Average weight gain of 4-7 ounces (113-198 g) per week after age 4 days.  Consistent daily weight gain by age 5 days, without weight loss after the age of 2 weeks. After a feeding, your baby may spit up a small amount. This is common. BREASTFEEDING FREQUENCY AND DURATION Frequent feeding will help you make more milk and can prevent sore nipples and breast engorgement. Breastfeed when you feel the need to reduce the fullness of your breasts or when your baby shows signs of hunger. This is called "breastfeeding on demand." Avoid introducing a pacifier to your baby while you are working to establish breastfeeding (the first 4-6 weeks after your baby is born). After this time you may choose to use a pacifier. Research has shown that pacifier use during the first year of a baby's life decreases the risk of sudden infant death syndrome (SIDS). Allow your baby to feed on each breast as long as he or she wants. Breastfeed until your baby is finished feeding. When your baby unlatches or falls asleep while feeding from the first breast, offer the second breast. Because newborns are often sleepy in the first few weeks of life, you may need to awaken your baby to get him or her to feed. Breastfeeding times will vary from baby to baby. However, the following rules can serve as a guide to help you ensure that your baby is  properly fed:  Newborns (babies 4 weeks of age or younger) may breastfeed every 1-3 hours.  Newborns should not go longer than 3 hours during the day or 5 hours during the night without breastfeeding.  You should breastfeed your baby a minimum of 8 times in a 24-hour period until you begin to introduce solid foods to your baby at around 6 months of age. BREAST MILK PUMPING Pumping and storing breast milk allows you to ensure that your baby is exclusively fed your breast milk, even at times when you are unable to breastfeed. This is especially important if you are going back to work while you are still breastfeeding or when you are not able to be present during feedings. Your lactation consultant can give you guidelines on how long it is safe to store breast milk. A breast pump is a machine that allows you to pump milk from your breast into a sterile bottle. The pumped breast milk can then be stored in a refrigerator or freezer. Some breast pumps are operated by hand, while others use electricity. Ask your lactation consultant which type will work best for you. Breast pumps can be purchased, but some hospitals and breastfeeding support groups lease breast pumps on a monthly basis. A lactation consultant can teach you how to hand express breast milk, if you prefer not to use a pump. CARING FOR YOUR BREASTS WHILE YOU BREASTFEED Nipples can become dry, cracked, and sore while breastfeeding. The following recommendations can help keep your breasts moisturized and healthy:  Avoid using soap on your nipples.  Wear a supportive bra. Although not required, special nursing bras and tank tops are designed to allow access to your   breasts for breastfeeding without taking off your entire bra or top. Avoid wearing underwire-style bras or extremely tight bras.  Air dry your nipples for 3-4minutes after each feeding.  Use only cotton bra pads to absorb leaked breast milk. Leaking of breast milk between feedings  is normal.  Use lanolin on your nipples after breastfeeding. Lanolin helps to maintain your skin's normal moisture barrier. If you use pure lanolin, you do not need to wash it off before feeding your baby again. Pure lanolin is not toxic to your baby. You may also hand express a few drops of breast milk and gently massage that milk into your nipples and allow the milk to air dry. In the first few weeks after giving birth, some women experience extremely full breasts (engorgement). Engorgement can make your breasts feel heavy, warm, and tender to the touch. Engorgement peaks within 3-5 days after you give birth. The following recommendations can help ease engorgement:  Completely empty your breasts while breastfeeding or pumping. You may want to start by applying warm, moist heat (in the shower or with warm water-soaked hand towels) just before feeding or pumping. This increases circulation and helps the milk flow. If your baby does not completely empty your breasts while breastfeeding, pump any extra milk after he or she is finished.  Wear a snug bra (nursing or regular) or tank top for 1-2 days to signal your body to slightly decrease milk production.  Apply ice packs to your breasts, unless this is too uncomfortable for you.  Make sure that your baby is latched on and positioned properly while breastfeeding. If engorgement persists after 48 hours of following these recommendations, contact your health care provider or a lactation consultant. OVERALL HEALTH CARE RECOMMENDATIONS WHILE BREASTFEEDING  Eat healthy foods. Alternate between meals and snacks, eating 3 of each per day. Because what you eat affects your breast milk, some of the foods may make your baby more irritable than usual. Avoid eating these foods if you are sure that they are negatively affecting your baby.  Drink milk, fruit juice, and water to satisfy your thirst (about 10 glasses a day).  Rest often, relax, and continue to take  your prenatal vitamins to prevent fatigue, stress, and anemia.  Continue breast self-awareness checks.  Avoid chewing and smoking tobacco. Chemicals from cigarettes that pass into breast milk and exposure to secondhand smoke may harm your baby.  Avoid alcohol and drug use, including marijuana. Some medicines that may be harmful to your baby can pass through breast milk. It is important to ask your health care provider before taking any medicine, including all over-the-counter and prescription medicine as well as vitamin and herbal supplements. It is possible to become pregnant while breastfeeding. If birth control is desired, ask your health care provider about options that will be safe for your baby. SEEK MEDICAL CARE IF:  You feel like you want to stop breastfeeding or have become frustrated with breastfeeding.  You have painful breasts or nipples.  Your nipples are cracked or bleeding.  Your breasts are red, tender, or warm.  You have a swollen area on either breast.  You have a fever or chills.  You have nausea or vomiting.  You have drainage other than breast milk from your nipples.  Your breasts do not become full before feedings by the fifth day after you give birth.  You feel sad and depressed.  Your baby is too sleepy to eat well.  Your baby is having trouble sleeping.     Your baby is wetting less than 3 diapers in a 24-hour period.  Your baby has less than 3 stools in a 24-hour period.  Your baby's skin or the white part of his or her eyes becomes yellow.   Your baby is not gaining weight by 5 days of age. SEEK IMMEDIATE MEDICAL CARE IF:  Your baby is overly tired (lethargic) and does not want to wake up and feed.  Your baby develops an unexplained fever.   This information is not intended to replace advice given to you by your health care provider. Make sure you discuss any questions you have with your health care provider.   Document Released: 07/13/2005  Document Revised: 04/03/2015 Document Reviewed: 01/04/2013 Elsevier Interactive Patient Education 2016 [employer]  

## 2015-12-07 ENCOUNTER — Inpatient Hospital Stay (HOSPITAL_COMMUNITY)
Admission: AD | Admit: 2015-12-07 | Discharge: 2015-12-08 | Disposition: A | Discharge status: Home / Self Care | Payer: Medicaid Other | Source: Ambulatory Visit | Attending: Obstetrics & Gynecology | Admitting: Obstetrics & Gynecology

## 2015-12-07 ENCOUNTER — Encounter (HOSPITAL_COMMUNITY): Payer: Self-pay | Admitting: *Deleted

## 2015-12-07 DIAGNOSIS — Z3483 Encounter for supervision of other normal pregnancy, third trimester: Secondary | ICD-10-CM | POA: Diagnosis not present

## 2015-12-07 DIAGNOSIS — O9989 Other specified diseases and conditions complicating pregnancy, childbirth and the puerperium: Secondary | ICD-10-CM | POA: Diagnosis not present

## 2015-12-07 DIAGNOSIS — Z3493 Encounter for supervision of normal pregnancy, unspecified, third trimester: Secondary | ICD-10-CM

## 2015-12-07 DIAGNOSIS — N898 Other specified noninflammatory disorders of vagina: Secondary | ICD-10-CM | POA: Diagnosis not present

## 2015-12-07 DIAGNOSIS — Z3A40 40 weeks gestation of pregnancy: Secondary | ICD-10-CM | POA: Diagnosis not present

## 2015-12-07 NOTE — MAU Note (Signed)
Pt reports she had a wet spot when she got up from a  [employer] at home and her panties are a little wet now. Denies any vag bleeding or contractions at this time .

## 2015-12-08 DIAGNOSIS — N898 Other specified noninflammatory disorders of vagina: Secondary | ICD-10-CM

## 2015-12-08 DIAGNOSIS — O9989 Other specified diseases and conditions complicating pregnancy, childbirth and the puerperium: Secondary | ICD-10-CM | POA: Diagnosis not present

## 2015-12-08 DIAGNOSIS — Z3A40 40 weeks gestation of pregnancy: Secondary | ICD-10-CM | POA: Diagnosis not present

## 2015-12-08 NOTE — MAU Provider Note (Signed)
Chief Complaint:  Rupture of Membranes   First Provider Initiated Contact with Patient 12/08/15 0031     HPI   Adrienne Griffin is a 25 y.o. G1P0 at 71w1dwho presents to maternity admissions reporting possible leaking of membranes, Had a small area of wetness.  Rare contractions, not painful She reports good fetal movement, denies vaginal bleeding, vaginal itching/burning, urinary symptoms, h/a, dizziness, n/v, diarrhea, constipation or fever/chills.  She denies headache, visual changes or RUQ abdominal pain.   Past Medical History: Past Medical History  Diagnosis Date  . Medical history non-contributory     Past obstetric history: OB History  Gravida Para Term Preterm AB SAB TAB Ectopic Multiple Living  1             # Outcome Date GA Lbr Len/2nd Weight Sex Delivery Anes PTL Lv  1 Current               Past Surgical History: Past Surgical History  Procedure Laterality Date  . Wisdom tooth removal  2010    Family History: Family History  Problem Relation Age of Onset  . Hypertension Mother   . Diabetes Mother     Social History: Social History  Substance Use Topics  . Smoking status: Never Smoker   . Smokeless tobacco: None  . Alcohol Use: Yes     Comment: occasional pre-pregnancy    Allergies: No Known Allergies  Meds:  Prescriptions prior to admission  Medication Sig Dispense Refill Last Dose  . Misc. Devices (BREAST PUMP) MISC Dispense one breast pump for patient 1 each 0 Taking  . Prenatal Multivit-Min-Fe-FA (PRENATAL VITAMINS PO) Take 1 tablet by mouth daily.    Taking    I have reviewed patient's Past Medical Hx, Surgical Hx, Family Hx, Social Hx, medications and allergies.   ROS:  Review of Systems  Constitutional: Negative for fever and chills.  Gastrointestinal: Negative for nausea, vomiting, abdominal pain, diarrhea and constipation.  Genitourinary: Positive for vaginal discharge. Negative for dysuria, vaginal bleeding, difficulty urinating  and pelvic pain.  Musculoskeletal: Negative for back pain.   Other systems negative  Physical Exam  Patient Vitals for the past 24 hrs:  BP Temp Temp src Pulse Resp  12/07/15 2352 126/82 mmHg 98.5 F (36.9 C) Oral 95 18   Constitutional: Well-developed, well-nourished female in no acute distress.  Cardiovascular: normal rate and rhythm Respiratory: normal effort, clear to auscultation bilaterally GI: Abd soft, non-tender, gravid appropriate for gestational age.   No rebound or guarding. MS: Extremities nontender, no edema, normal ROM Neurologic: Alert and oriented x 4.  GU: Neg CVAT.  PELVIC EXAM: Cervix pink, visually closed, without lesion, scant white creamy discharge, vaginal walls and external genitalia normal Negative Pooling Negative Ferning     Cervix 1/80/-3/vertex    FHT:  Baseline 140 , moderate variability, accelerations present, no decelerations Contractions:   Rare   Labs: No results found for this or any previous visit (from the past 24 hour(s)). O/POS/-- (09/28 1131)  Imaging:  No results found.  MAU Course/MDM: I have ordered labs and reviewed results. >> Negative ferning NST reviewed > Reactive   Assessment: 1. Supervision of normal pregnancy, third trimester   2.    Vaginal moisture, no evidence of ruptured membranes  Plan: Discharge home Labor precautions and fetal kick counts Follow up in Office for prenatal visits and recheck of cervix    Medication List    ASK your doctor about these medications  Breast Pump Misc  Dispense one breast pump for patient     PRENATAL VITAMINS PO  Take 1 tablet by mouth daily.       Pt stable at time of discharge.  Encouraged to return here if she develops worsening of symptoms, increase in pain, fever, or other concerning symptoms.      Wynelle BourgeoisMarie Tiarrah Saville CNM, MSN Certified Nurse-Midwife 12/08/2015 12:54 AM

## 2015-12-10 ENCOUNTER — Ambulatory Visit (INDEPENDENT_AMBULATORY_CARE_PROVIDER_SITE_OTHER): Payer: Medicaid Other | Admitting: Obstetrics and Gynecology

## 2015-12-10 ENCOUNTER — Encounter: Payer: Self-pay | Admitting: Obstetrics and Gynecology

## 2015-12-10 ENCOUNTER — Encounter (HOSPITAL_COMMUNITY): Payer: Self-pay | Admitting: *Deleted

## 2015-12-10 ENCOUNTER — Telehealth (HOSPITAL_COMMUNITY): Payer: Self-pay | Admitting: *Deleted

## 2015-12-10 VITALS — BP 125/81 | HR 86 | Wt 196.8 lb

## 2015-12-10 DIAGNOSIS — O480 Post-term pregnancy: Secondary | ICD-10-CM

## 2015-12-10 DIAGNOSIS — Z3403 Encounter for supervision of normal first pregnancy, third trimester: Secondary | ICD-10-CM

## 2015-12-10 DIAGNOSIS — Z3493 Encounter for supervision of normal pregnancy, unspecified, third trimester: Secondary | ICD-10-CM

## 2015-12-10 DIAGNOSIS — E669 Obesity, unspecified: Secondary | ICD-10-CM

## 2015-12-10 DIAGNOSIS — O99213 Obesity complicating pregnancy, third trimester: Secondary | ICD-10-CM

## 2015-12-10 NOTE — Progress Notes (Signed)
Subjective:  Adrienne Griffin is a 25 y.o. G1P0 at 2323w3d being seen today for ongoing prenatal care.  She is currently monitored for the following issues for this low-risk pregnancy and has Supervision of normal pregnancy and Obesity in pregnancy, antepartum on her problem list.  Patient reports no complaints.  Contractions: Irregular. Vag. Bleeding: None.  Movement: Present. Denies leaking of fluid.   The following portions of the patient's history were reviewed and updated as appropriate: allergies, current medications, past family history, past medical history, past social history, past surgical history and problem list. Problem list updated.  Objective:   Filed Vitals:   12/10/15 1002  BP: 125/81  Pulse: 86  Weight: 196 lb 12.8 oz (89.268 kg)    Fetal Status: Fetal Heart Rate (bpm): NST   Movement: Present     General:  Alert, oriented and cooperative. Patient is in no acute distress.  Skin: Skin is warm and dry. No rash noted.   Cardiovascular: Normal heart rate noted  Respiratory: Normal respiratory effort, no problems with respiration noted  Abdomen: Soft, gravid, appropriate for gestational age. Pain/Pressure: Present     Pelvic: Vag. Bleeding: None Vag D/C Character: Thin   Cervical exam deferred        Extremities: Normal range of motion.  Edema: None  Mental Status: Normal mood and affect. Normal behavior. Normal judgment and thought content.   Urinalysis:      Assessment and Plan:  Pregnancy: G1P0 at 6923w3d  1. Supervision of normal pregnancy, third trimester Patient will be scheduled for postdate induction on Saturday Answered all questions regarding induction of labor - Fetal nonstress test- reviewed and reactive  2. Obesity in pregnancy, antepartum, third trimester   Term labor symptoms and general obstetric precautions including but not limited to vaginal bleeding, contractions, leaking of fluid and fetal movement were reviewed in detail with the  patient. Please refer to After Visit Summary for other counseling recommendations.  Return in about 6 weeks (around 01/21/2016) for pp visit.   Catalina AntiguaPeggy Fraida Veldman, MD

## 2015-12-10 NOTE — Addendum Note (Signed)
Addended by: Arne ClevelandHUTCHINSON, MANDY J on: 12/10/2015 11:09 AM   Modules accepted: Orders

## 2015-12-10 NOTE — Telephone Encounter (Signed)
Preadmission screen  

## 2015-12-12 ENCOUNTER — Inpatient Hospital Stay (HOSPITAL_COMMUNITY)
Admission: AD | Admit: 2015-12-12 | Discharge: 2015-12-12 | Disposition: A | Discharge status: Home / Self Care | Payer: Medicaid Other | Source: Ambulatory Visit | Attending: Obstetrics & Gynecology | Admitting: Obstetrics & Gynecology

## 2015-12-12 ENCOUNTER — Encounter (HOSPITAL_COMMUNITY): Payer: Self-pay

## 2015-12-12 DIAGNOSIS — O26893 Other specified pregnancy related conditions, third trimester: Secondary | ICD-10-CM | POA: Insufficient documentation

## 2015-12-12 DIAGNOSIS — R109 Unspecified abdominal pain: Secondary | ICD-10-CM | POA: Diagnosis not present

## 2015-12-12 NOTE — Discharge Instructions (Signed)
Braxton Hicks Contractions °Contractions of the uterus can occur throughout pregnancy. Contractions are not always a sign that you are in labor.  °WHAT ARE BRAXTON HICKS CONTRACTIONS?  °Contractions that occur before labor are called Braxton Hicks contractions, or false labor. Toward the end of pregnancy (32-34 weeks), these contractions can develop more often and may become more forceful. This is not true labor because these contractions do not result in opening (dilatation) and thinning of the cervix. They are sometimes difficult to tell apart from true labor because these contractions can be forceful and people have different pain tolerances. You should not feel embarrassed if you go to the hospital with false labor. Sometimes, the only way to tell if you are in true labor is for your health care provider to look for changes in the cervix. °If there are no prenatal problems or other health problems associated with the pregnancy, it is completely safe to be sent home with false labor and await the onset of true labor. °HOW CAN YOU TELL THE DIFFERENCE BETWEEN TRUE AND FALSE LABOR? °False Labor °· The contractions of false labor are usually shorter and not as hard as those of true labor.   °· The contractions are usually irregular.   °· The contractions are often felt in the front of the lower abdomen and in the groin.   °· The contractions may go away when you walk around or change positions while lying down.   °· The contractions get weaker and are shorter lasting as time goes on.   °· The contractions do not usually become progressively stronger, regular, and closer together as with true labor.   °True Labor °1. Contractions in true labor last 30-70 seconds, become very regular, usually become more intense, and increase in frequency.   °2. The contractions do not go away with walking.   °3. The discomfort is usually felt in the top of the uterus and spreads to the lower abdomen and low back.   °4. True labor can  be determined by your health care provider with an exam. This will show that the cervix is dilating and getting thinner.   °WHAT TO REMEMBER °· Keep up with your usual exercises and follow other instructions given by your health care provider.   °· Take medicines as directed by your health care provider.   °· Keep your regular prenatal appointments.   °· Eat and drink lightly if you think you are going into labor.   °· If Braxton Hicks contractions are making you uncomfortable:   °· Change your position from lying down or resting to walking, or from walking to resting.   °· Sit and rest in a tub of warm water.   °· Drink 2-3 glasses of water. Dehydration may cause these contractions.   °· Do slow and deep breathing several times an hour.   °WHEN SHOULD I SEEK IMMEDIATE MEDICAL CARE? °Seek immediate medical care if: °· Your contractions become stronger, more regular, and closer together.   °· You have fluid leaking or gushing from your vagina.   °· You have a fever.   °· You pass blood-tinged mucus.   °· You have vaginal bleeding.   °· You have continuous abdominal pain.   °· You have low back pain that you never had before.   °· You feel your baby's head pushing down and causing pelvic pressure.   °· Your baby is not moving as much as it used to.   °  °This information is not intended to replace advice given to you by your health care provider. Make sure you discuss any questions you have with your health care   provider. °  °Document Released: 07/13/2005 Document Revised: 07/18/2013 Document Reviewed: 04/24/2013 °Elsevier Interactive Patient Education ©2016 [employer] ° °Fetal Movement Counts °Patient Name: __________________________________________________ Patient Due Date: ____________________ °Performing a fetal movement count is highly recommended in high-risk pregnancies, but it is good for every pregnant woman to do. Your health care provider may ask you to start counting fetal movements at 28 weeks of the  pregnancy. Fetal movements often increase: °· After eating a full meal. °· After physical activity. °· After eating or drinking something sweet or cold. °· At rest. °Pay attention to when you feel the baby is most active. This will help you notice a pattern of your baby's sleep and wake cycles and what factors contribute to an increase in fetal movement. It is important to perform a fetal movement count at the same time each day when your baby is normally most active.  °HOW TO COUNT FETAL MOVEMENTS °5. Find a quiet and comfortable area to sit or lie down on your left side. Lying on your left side provides the best blood and oxygen circulation to your baby. °6. Write down the day and time on a sheet of paper or in a journal. °7. Start counting kicks, flutters, swishes, rolls, or jabs in a 2-hour period. You should feel at least 10 movements within 2 hours. °8. If you do not feel 10 movements in 2 hours, wait 2-3 hours and count again. Look for a change in the pattern or not enough counts in 2 hours. °SEEK MEDICAL CARE IF: °· You feel less than 10 counts in 2 hours, tried twice. °· There is no movement in over an hour. °· The pattern is changing or taking longer each day to reach 10 counts in 2 hours. °· You feel the baby is not moving as he or she usually does. °Date: ____________ Movements: ____________ Start time: ____________ Finish time: ____________  °Date: ____________ Movements: ____________ Start time: ____________ Finish time: ____________ °Date: ____________ Movements: ____________ Start time: ____________ Finish time: ____________ °Date: ____________ Movements: ____________ Start time: ____________ Finish time: ____________ °Date: ____________ Movements: ____________ Start time: ____________ Finish time: ____________ °Date: ____________ Movements: ____________ Start time: ____________ Finish time: ____________ °Date: ____________ Movements: ____________ Start time: ____________ Finish time:  ____________ °Date: ____________ Movements: ____________ Start time: ____________ Finish time: ____________  °Date: ____________ Movements: ____________ Start time: ____________ Finish time: ____________ °Date: ____________ Movements: ____________ Start time: ____________ Finish time: ____________ °Date: ____________ Movements: ____________ Start time: ____________ Finish time: ____________ °Date: ____________ Movements: ____________ Start time: ____________ Finish time: ____________ °Date: ____________ Movements: ____________ Start time: ____________ Finish time: ____________ °Date: ____________ Movements: ____________ Start time: ____________ Finish time: ____________ °Date: ____________ Movements: ____________ Start time: ____________ Finish time: ____________  °Date: ____________ Movements: ____________ Start time: ____________ Finish time: ____________ °Date: ____________ Movements: ____________ Start time: ____________ Finish time: ____________ °Date: ____________ Movements: ____________ Start time: ____________ Finish time: ____________ °Date: ____________ Movements: ____________ Start time: ____________ Finish time: ____________ °Date: ____________ Movements: ____________ Start time: ____________ Finish time: ____________ °Date: ____________ Movements: ____________ Start time: ____________ Finish time: ____________ °Date: ____________ Movements: ____________ Start time: ____________ Finish time: ____________  °Date: ____________ Movements: ____________ Start time: ____________ Finish time: ____________ °Date: ____________ Movements: ____________ Start time: ____________ Finish time: ____________ °Date: ____________ Movements: ____________ Start time: ____________ Finish time: ____________ °Date: ____________ Movements: ____________ Start time: ____________ Finish time: ____________ °Date: ____________ Movements: ____________ Start time: ____________ Finish time: ____________ °Date: ____________ Movements:  ____________ Start time: ____________ Finish   time: ____________ °Date: ____________ Movements: ____________ Start time: ____________ Finish time: ____________  °Date: ____________ Movements: ____________ Start time: ____________ Finish time: ____________ °Date: ____________ Movements: ____________ Start time: ____________ Finish time: ____________ °Date: ____________ Movements: ____________ Start time: ____________ Finish time: ____________ °Date: ____________ Movements: ____________ Start time: ____________ Finish time: ____________ °Date: ____________ Movements: ____________ Start time: ____________ Finish time: ____________ °Date: ____________ Movements: ____________ Start time: ____________ Finish time: ____________ °Date: ____________ Movements: ____________ Start time: ____________ Finish time: ____________  °Date: ____________ Movements: ____________ Start time: ____________ Finish time: ____________ °Date: ____________ Movements: ____________ Start time: ____________ Finish time: ____________ °Date: ____________ Movements: ____________ Start time: ____________ Finish time: ____________ °Date: ____________ Movements: ____________ Start time: ____________ Finish time: ____________ °Date: ____________ Movements: ____________ Start time: ____________ Finish time: ____________ °Date: ____________ Movements: ____________ Start time: ____________ Finish time: ____________ °Date: ____________ Movements: ____________ Start time: ____________ Finish time: ____________  °Date: ____________ Movements: ____________ Start time: ____________ Finish time: ____________ °Date: ____________ Movements: ____________ Start time: ____________ Finish time: ____________ °Date: ____________ Movements: ____________ Start time: ____________ Finish time: ____________ °Date: ____________ Movements: ____________ Start time: ____________ Finish time: ____________ °Date: ____________ Movements: ____________ Start time: ____________ Finish  time: ____________ °Date: ____________ Movements: ____________ Start time: ____________ Finish time: ____________ °Date: ____________ Movements: ____________ Start time: ____________ Finish time: ____________  °Date: ____________ Movements: ____________ Start time: ____________ Finish time: ____________ °Date: ____________ Movements: ____________ Start time: ____________ Finish time: ____________ °Date: ____________ Movements: ____________ Start time: ____________ Finish time: ____________ °Date: ____________ Movements: ____________ Start time: ____________ Finish time: ____________ °Date: ____________ Movements: ____________ Start time: ____________ Finish time: ____________ °Date: ____________ Movements: ____________ Start time: ____________ Finish time: ____________ °  °This information is not intended to replace advice given to you by your health care provider. Make sure you discuss any questions you have with your health care provider. °  °Document Released: 08/12/2006 Document Revised: 08/03/2014 Document Reviewed: 05/09/2012 °Elsevier Interactive Patient Education ©2016 [employer] ° °

## 2015-12-12 NOTE — MAU Note (Signed)
Pt may be discharged once FHT is reactive per CNM

## 2015-12-12 NOTE — MAU Note (Signed)
Pt reports contractions all day, getting closer and is now having back pain. Denies bleeding or ROM.

## 2015-12-13 ENCOUNTER — Telehealth: Payer: Self-pay | Admitting: *Deleted

## 2015-12-13 ENCOUNTER — Encounter (HOSPITAL_COMMUNITY): Payer: Self-pay | Admitting: *Deleted

## 2015-12-13 ENCOUNTER — Inpatient Hospital Stay (HOSPITAL_COMMUNITY)
Admission: AD | Admit: 2015-12-13 | Discharge: 2015-12-13 | Disposition: A | Discharge status: Home / Self Care | Payer: Medicaid Other | Source: Ambulatory Visit | Attending: Family Medicine | Admitting: Family Medicine

## 2015-12-13 NOTE — MAU Note (Signed)
Pt seen last night for labor eval.  Pt states this morning woke up and had some vaginal bleeding in toilet following urination.  Pt states now spotting but does not need to wear a pad.  Pt states contracting 10-15 minutes apart.  Denies LOF.

## 2015-12-13 NOTE — Telephone Encounter (Signed)
Pt is [redacted] weeks pregnant, states she is having contractions every 10 min lasting 45-60 seconds.  Also experiencing some bright red vaginal bleeding this morning.  Informed pt to go to MAU for evaluation.

## 2015-12-13 NOTE — Discharge Instructions (Signed)
Please return to MAU as needed for emergencies.  Keep follow-up prenatal care appointments.

## 2015-12-14 ENCOUNTER — Inpatient Hospital Stay (HOSPITAL_COMMUNITY)
Admission: RE | Admit: 2015-12-14 | Discharge: 2015-12-17 | DRG: 775 | Disposition: A | Discharge status: Home / Self Care | Payer: Medicaid Other | Source: Ambulatory Visit | Attending: Obstetrics and Gynecology | Admitting: Obstetrics and Gynecology

## 2015-12-14 ENCOUNTER — Encounter (HOSPITAL_COMMUNITY): Payer: Self-pay

## 2015-12-14 ENCOUNTER — Inpatient Hospital Stay (HOSPITAL_COMMUNITY): Payer: Medicaid Other | Admitting: Anesthesiology

## 2015-12-14 VITALS — BP 124/77 | HR 98 | Temp 98.0°F | Resp 18 | Ht 59.0 in | Wt 197.2 lb

## 2015-12-14 DIAGNOSIS — O99214 Obesity complicating childbirth: Secondary | ICD-10-CM | POA: Diagnosis present

## 2015-12-14 DIAGNOSIS — O411230 Chorioamnionitis, third trimester, not applicable or unspecified: Secondary | ICD-10-CM | POA: Diagnosis present

## 2015-12-14 DIAGNOSIS — Z3A41 41 weeks gestation of pregnancy: Secondary | ICD-10-CM | POA: Diagnosis not present

## 2015-12-14 DIAGNOSIS — Z8249 Family history of ischemic heart disease and other diseases of the circulatory system: Secondary | ICD-10-CM

## 2015-12-14 DIAGNOSIS — O480 Post-term pregnancy: Secondary | ICD-10-CM | POA: Diagnosis present

## 2015-12-14 DIAGNOSIS — Z6839 Body mass index (BMI) 39.0-39.9, adult: Secondary | ICD-10-CM | POA: Diagnosis not present

## 2015-12-14 DIAGNOSIS — Z833 Family history of diabetes mellitus: Secondary | ICD-10-CM | POA: Diagnosis not present

## 2015-12-14 DIAGNOSIS — O99210 Obesity complicating pregnancy, unspecified trimester: Secondary | ICD-10-CM | POA: Diagnosis present

## 2015-12-14 DIAGNOSIS — O411290 Chorioamnionitis, unspecified trimester, not applicable or unspecified: Secondary | ICD-10-CM | POA: Diagnosis not present

## 2015-12-14 DIAGNOSIS — Z3490 Encounter for supervision of normal pregnancy, unspecified, unspecified trimester: Secondary | ICD-10-CM

## 2015-12-14 DIAGNOSIS — E669 Obesity, unspecified: Secondary | ICD-10-CM | POA: Diagnosis present

## 2015-12-14 LAB — CBC
HCT: 31.8 % — ABNORMAL LOW (ref 36.0–46.0)
HEMOGLOBIN: 10.5 g/dL — AB (ref 12.0–15.0)
MCH: 26 pg (ref 26.0–34.0)
MCHC: 33 g/dL (ref 30.0–36.0)
MCV: 78.7 fL (ref 78.0–100.0)
Platelets: 272 10*3/uL (ref 150–400)
RBC: 4.04 MIL/uL (ref 3.87–5.11)
RDW: 15.6 % — ABNORMAL HIGH (ref 11.5–15.5)
WBC: 7.9 10*3/uL (ref 4.0–10.5)

## 2015-12-14 LAB — ABO/RH: ABO/RH(D): O POS

## 2015-12-14 LAB — TYPE AND SCREEN
ABO/RH(D): O POS
Antibody Screen: NEGATIVE

## 2015-12-14 LAB — RPR: RPR: NONREACTIVE

## 2015-12-14 MED ORDER — LACTATED RINGERS IV BOLUS (SEPSIS)
1000.0000 mL | Freq: Once | INTRAVENOUS | Status: AC
  Administered 2015-12-14: 1000 mL via INTRAVENOUS

## 2015-12-14 MED ORDER — DIPHENHYDRAMINE HCL 50 MG/ML IJ SOLN: 12.5000 mg | INTRAMUSCULAR | Status: DC | PRN

## 2015-12-14 MED ORDER — EPHEDRINE 5 MG/ML INJ
10.0000 mg | INTRAVENOUS | Status: DC | PRN
  Filled 2015-12-14: qty 2

## 2015-12-14 MED ORDER — PROMETHAZINE HCL 25 MG PO TABS: 25.0000 mg | ORAL_TABLET | Freq: Once | ORAL | Status: DC

## 2015-12-14 MED ORDER — OXYCODONE-ACETAMINOPHEN 5-325 MG PO TABS: 2.0000 | ORAL_TABLET | ORAL | Status: DC | PRN

## 2015-12-14 MED ORDER — LACTATED RINGERS IV SOLN: 500.0000 mL | Freq: Once | INTRAVENOUS | Status: DC

## 2015-12-14 MED ORDER — PHENYLEPHRINE 40 MCG/ML (10ML) SYRINGE FOR IV PUSH (FOR BLOOD PRESSURE SUPPORT): 80.0000 ug | PREFILLED_SYRINGE | INTRAVENOUS | Status: DC | PRN

## 2015-12-14 MED ORDER — CITRIC ACID-SODIUM CITRATE 334-500 MG/5ML PO SOLN
30.0000 mL | ORAL | Status: DC | PRN
  Filled 2015-12-14: qty 15

## 2015-12-14 MED ORDER — TERBUTALINE SULFATE 1 MG/ML IJ SOLN
0.2500 mg | Freq: Once | INTRAMUSCULAR | Status: DC | PRN
  Filled 2015-12-14: qty 1

## 2015-12-14 MED ORDER — LIDOCAINE HCL (PF) 1 % IJ SOLN
INTRAMUSCULAR | Status: DC | PRN
Start: 2015-12-14 — End: 2015-12-15
  Administered 2015-12-14 (×2): 4 mL via EPIDURAL

## 2015-12-14 MED ORDER — LACTATED RINGERS IV SOLN
INTRAVENOUS | Status: DC
  Administered 2015-12-15: 04:00:00 via INTRAVENOUS

## 2015-12-14 MED ORDER — LACTATED RINGERS IV SOLN
500.0000 mL | Freq: Once | INTRAVENOUS | Status: AC
  Administered 2015-12-14: 500 mL via INTRAVENOUS

## 2015-12-14 MED ORDER — LIDOCAINE HCL (PF) 1 % IJ SOLN
30.0000 mL | INTRAMUSCULAR | Status: DC | PRN
  Filled 2015-12-14: qty 30

## 2015-12-14 MED ORDER — LACTATED RINGERS IV SOLN: 500.0000 mL | INTRAVENOUS | Status: DC | PRN

## 2015-12-14 MED ORDER — OXYTOCIN BOLUS FROM INFUSION: 500.0000 mL | INTRAVENOUS | Status: DC

## 2015-12-14 MED ORDER — FENTANYL 2.5 MCG/ML BUPIVACAINE 1/10 % EPIDURAL INFUSION (WH - ANES)
14.0000 mL/h | INTRAMUSCULAR | Status: DC | PRN
  Administered 2015-12-14 – 2015-12-15 (×3): 14 mL/h via EPIDURAL
  Filled 2015-12-14 (×3): qty 125

## 2015-12-14 MED ORDER — OXYTOCIN 40 UNITS IN LACTATED RINGERS INFUSION - SIMPLE MED: 2.5000 [IU]/h | INTRAVENOUS | Status: DC

## 2015-12-14 MED ORDER — LIDOCAINE HCL (PF) 1 % IJ SOLN
INTRAMUSCULAR | Status: AC
  Filled 2015-12-14: qty 30

## 2015-12-14 MED ORDER — OXYTOCIN 40 UNITS IN LACTATED RINGERS INFUSION - SIMPLE MED
INTRAVENOUS | Status: AC
  Filled 2015-12-14: qty 1000

## 2015-12-14 MED ORDER — PHENYLEPHRINE 40 MCG/ML (10ML) SYRINGE FOR IV PUSH (FOR BLOOD PRESSURE SUPPORT)
80.0000 ug | PREFILLED_SYRINGE | INTRAVENOUS | Status: DC | PRN
  Filled 2015-12-14: qty 5

## 2015-12-14 MED ORDER — OXYTOCIN 40 UNITS IN LACTATED RINGERS INFUSION - SIMPLE MED
1.0000 m[IU]/min | INTRAVENOUS | Status: DC
Start: 2015-12-14 — End: 2015-12-15
  Administered 2015-12-14 – 2015-12-15 (×2): 2 m[IU]/min via INTRAVENOUS
  Filled 2015-12-14: qty 1000

## 2015-12-14 MED ORDER — PHENYLEPHRINE 40 MCG/ML (10ML) SYRINGE FOR IV PUSH (FOR BLOOD PRESSURE SUPPORT)
80.0000 ug | PREFILLED_SYRINGE | INTRAVENOUS | Status: DC | PRN
  Filled 2015-12-14: qty 5
  Filled 2015-12-14: qty 10

## 2015-12-14 MED ORDER — LACTATED RINGERS IV SOLN
INTRAVENOUS | Status: DC
  Administered 2015-12-14 (×2): via INTRAVENOUS

## 2015-12-14 MED ORDER — EPHEDRINE 5 MG/ML INJ: 10.0000 mg | INTRAVENOUS | Status: DC | PRN

## 2015-12-14 MED ORDER — OXYCODONE-ACETAMINOPHEN 5-325 MG PO TABS: 1.0000 | ORAL_TABLET | ORAL | Status: DC | PRN

## 2015-12-14 MED ORDER — ONDANSETRON HCL 4 MG/2ML IJ SOLN: 4.0000 mg | Freq: Four times a day (QID) | INTRAMUSCULAR | Status: DC | PRN

## 2015-12-14 MED ORDER — ACETAMINOPHEN 325 MG PO TABS
650.0000 mg | ORAL_TABLET | ORAL | Status: DC | PRN
  Administered 2015-12-15: 650 mg via ORAL
  Filled 2015-12-14: qty 2

## 2015-12-14 NOTE — MAU Note (Signed)
Pt reports painful contractions all night and did not sleep

## 2015-12-14 NOTE — Progress Notes (Signed)
Labor Progress Note Adrienne Griffin is a 25 y.o. G1P0 at 623w0d presented for IOL/active labor S: beter after epidural. pitocin started  O:  BP 124/82 mmHg  Pulse 101  Temp(Src) 98.8 F (37.1 C) (Oral)  Resp 18  Ht 4\' 11"  (1.499 m)  Wt 197 lb 3.2 oz (89.449 kg)  BMI 39.81 kg/m2  LMP 03/02/2015 (Exact Date) EFM: 130/mod/+accels, Variable decels, occasional and not recurrent  CVE: Dilation: 8 Effacement (%): 100 Cervical Position: Anterior Station: -1 Presentation: Vertex Exam by:: Karesa Maultsby MD   A&P: 25 y.o. G1P0 3523w0d her for IOL #Labor: progressing well. continue pit #Pain: s/p epidural #FWB: Cat II #GBS Neg  Adrienne FlakeKimberly Niles Sieanna Vanstone, MD 7:55 PM

## 2015-12-14 NOTE — Anesthesia Procedure Notes (Signed)
Epidural Patient location during procedure: OB Start time: 12/14/2015 1:42 PM End time: 12/14/2015 1:49 PM  Staffing Anesthesiologist: Shona SimpsonHOLLIS, Edye Hainline D Performed by: anesthesiologist   Preanesthetic Checklist Completed: patient identified, site marked, surgical consent, pre-op evaluation, timeout performed, IV checked, risks and benefits discussed and monitors and equipment checked  Epidural Patient position: sitting Prep: ChloraPrep Patient monitoring: heart rate, continuous pulse ox and blood pressure Approach: midline Location: L3-L4 Injection technique: LOR saline  Needle:  Needle type: Tuohy  Needle gauge: 17 G Needle length: 9 cm Catheter type: closed end flexible Catheter size: 20 Guage Test dose: negative and 1.5% lidocaine  Assessment Events: blood not aspirated, injection not painful, no injection resistance and no paresthesia  Additional Notes LOR @ 6  Patient identified. Risks/Benefits/Options discussed with patient including but not limited to bleeding, infection, nerve damage, paralysis, failed block, incomplete pain control, headache, blood pressure changes, nausea, vomiting, reactions to medications, itching and postpartum back pain. Confirmed with bedside nurse the patient's most recent platelet count. Confirmed with patient that they are not currently taking any anticoagulation, have any bleeding history or any family history of bleeding disorders. Patient expressed understanding and wished to proceed. All questions were answered. Sterile technique was used throughout the entire procedure. Please see nursing notes for vital signs. Test dose was given through epidural catheter and negative prior to continuing to dose epidural or start infusion. Warning signs of high block given to the patient including shortness of breath, tingling/numbness in hands, complete motor block, or any concerning symptoms with instructions to call for help. Patient was given instructions on  fall risk and not to get out of bed. All questions and concerns addressed with instructions to call with any issues or inadequate analgesia.    Reason for block:procedure for pain

## 2015-12-14 NOTE — Anesthesia Preprocedure Evaluation (Signed)
Anesthesia Evaluation  Patient identified by MRN, date of birth, ID band Patient awake    Reviewed: Allergy & Precautions, NPO status , Patient's Chart, lab work & pertinent test results  Airway Mallampati: III  TM Distance: >3 FB Neck ROM: Full    Dental  (+) Teeth Intact   Pulmonary neg pulmonary ROS,    breath sounds clear to auscultation       Cardiovascular negative cardio ROS   Rhythm:Regular Rate:Normal     Neuro/Psych negative neurological ROS  negative psych ROS   GI/Hepatic negative GI ROS, Neg liver ROS,   Endo/Other  negative endocrine ROS  Renal/GU negative Renal ROS  negative genitourinary   Musculoskeletal negative musculoskeletal ROS (+)   Abdominal   Peds negative pediatric ROS (+)  Hematology negative hematology ROS (+)   Anesthesia Other Findings   Reproductive/Obstetrics (+) Pregnancy                             Lab Results  Component Value Date   WBC 7.9 12/14/2015   HGB 10.5* 12/14/2015   HCT 31.8* 12/14/2015   MCV 78.7 12/14/2015   PLT 272 12/14/2015   No results found for: INR, PROTIME   Anesthesia Physical Anesthesia Plan  ASA: III  Anesthesia Plan: Epidural   Post-op Pain Management:    Induction:   Airway Management Planned:   Additional Equipment:   Intra-op Plan:   Post-operative Plan:   Informed Consent: I have reviewed the patients History and Physical, chart, labs and discussed the procedure including the risks, benefits and alternatives for the proposed anesthesia with the patient or authorized representative who has indicated his/her understanding and acceptance.     Plan Discussed with:   Anesthesia Plan Comments:         Anesthesia Quick Evaluation

## 2015-12-14 NOTE — H&P (Signed)
OBSTETRIC ADMISSION HISTORY AND PHYSICAL  Adrienne Griffin is a 25 y.o. female G1P0 withuncomplicated IUP at [redacted]w[redacted]d by 7wk [place] who presents for scheduled induction with frequent contractions every 5 minutes that began at 12am and have increased in intensity. She describes sharp and cramping pains in the lower abdomen. She was scheduled for induction at 7:30am today. She reports +FMs and denies any LOF, VB, blurry vision, headaches, peripheral edema, or vomiting.  She plans on breast feeding. She plans to use birth control, but is unsure of method.  Dating: By 7wk [place] --->  Estimated Date of Delivery: 12/07/15  Sono:   , CWD, normal anatomy, breech presentation, anterior placenta, 289g, 65% EFW   Prenatal History/Complications:  Past Medical History: Past Medical History  Diagnosis Date  . Medical history non-contributory     Past Surgical History: Past Surgical History  Procedure Laterality Date  . Wisdom tooth removal  2010    Obstetrical History: OB History    Gravida Para Term Preterm AB TAB SAB Ectopic Multiple Living   1               Social History: Social History   Social History  . Marital Status: Single    Spouse Name: N/A  . Number of Children: N/A  . Years of Education: N/A   Social History Main Topics  . Smoking status: Never Smoker   . Smokeless tobacco: Never Used  . Alcohol Use: Yes     Comment: occasional pre-pregnancy  . Drug Use: No  . Sexual Activity: Yes   Other Topics Concern  . None   Social History Narrative    Family History: Family History  Problem Relation Age of Onset  . Hypertension Mother   . Diabetes Mother     Allergies: No Known Allergies  Prescriptions prior to admission  Medication Sig Dispense Refill Last Dose  . Misc. Devices (BREAST PUMP) MISC Dispense one breast pump for patient (Patient not taking: Reported on 12/14/2015) 1 each 0 Taking     Review of Systems   GENERAL:  No fever, chills,  weakness HEENT:  No vision changes, congestion, or throat pain. SKIN:  No rash or bruising CARDIOVASCULAR:  No chest pain, palpitations or edema. RESPIRATORY:  No shortness of breath, no cough GASTROINTESTINAL:  No nausea, vomiting or diarrhea. No abdominal pain. GENITOURINARY:  IUP Pregnancy. Last menstrual period, 03/02/2015, no vaginal bleeding NEUROLOGICAL:  No headache, dizziness, or numbness MUSCULOSKELETAL:  No back pain. ENDOCRINOLOGIC:  No reports of sweating, cold or heat intolerance.  Blood pressure 136/74, pulse 100, temperature 97.9 F (36.6 C), resp. rate 18, height  (1.499 m), weight 89.449 kg (197 lb 3.2 oz), last menstrual period 03/02/2015. General appearance: alert, cooperative and appears stated age Lungs: clear to auscultation bilaterally Heart: regular rate and rhythm, no murmurs Abdomen: soft, generalized tenderness; bowel sounds normal Pelvic: See below Extremities: Homans sign is negative, no edema, no ecchymosis DTRs: equal bilaterally Presentation: Vertex  Fetal monitoring: Baseline 135/moderate variability, 15x15accels, no decels Uterine activityDate/time of onset: 12am 5/20, Frequency: 5-7 mins apart Cervical Exam      Dilation  3 filed at 12/14/2015 0717    Effacement (%)  90 filed at 12/14/2015 0717    Cervical Position  Middle filed at 12/14/2015 0717    Cervical Consistency  Soft filed at 12/14/2015 0717    Vag. Bleeding  Bloody Show filed at 12/14/2015 0717    Clots  None filed at 12/14/2015 [phone]  Station  -2 filed at 12/14/2015 [id number]    Presentation  Vertex filed at 12/14/2015 [id number]    Exam by:  Adrienne Griffin filed at 12/14/2015 [phone]       Prenatal labs: ABO, Rh: --/--/O POS (05/20 0725) Antibody: NEG (05/20 0725) Rubella:Immune RPR: NON REAC (02/21 1140)  HBsAg: NEGATIVE (09/28 1131)  HIV: NONREACTIVE (02/21 1140)  GBS: Negative (04/18 0000)  1 hr Glucola: 170 3 hr Glucola: wnl Genetic screening: neg Anatomy US:  Normal  Prenatal Transfer Tool  Maternal Diabetes: No Genetic Screening: Normal Maternal Ultrasounds/Referrals: Normal Fetal Ultrasounds or other Referrals:  None Maternal Substance Abuse:  No Significant Maternal Medications:  None Significant Maternal Lab Results: Lab values include: Group B Strep negative  Results for orders placed or performed during the hospital encounter of 12/14/15 (from the past 24 hour(s))  CBC   Collection Time: 12/14/15  7:25 AM  Result Value Ref Range   WBC 7.9 4.0 - 10.5 K/uL   RBC 4.04 3.87 - 5.11 MIL/uL   Hemoglobin 10.5 (L) 12.0 - 15.0 g/dL   HCT 84.631.8 (L) 96.236.0 - 95.246.0 %   MCV 78.7 78.0 - 100.0 fL   MCH 26.0 26.0 - 34.0 pg   MCHC 33.0 30.0 - 36.0 g/dL   RDW 84.115.6 (H) 32.411.5 - 40.115.5 %   Platelets 272 150 - 400 K/uL  Type and screen [facility]   Collection Time: 12/14/15  7:25 AM  Result Value Ref Range   ABO/RH(D) O POS    Antibody Screen NEG    Sample Expiration 12/17/2015     Patient Active Problem List   Diagnosis Date Noted  . Supervision of normal pregnancy 04/24/2015  . Obesity in pregnancy, antepartum 04/24/2015    Assessment: Adrienne Griffin is a 25 y.o. G1P0 at 4739w0d here for scheduled induction in stable condition.  #Labor:  - Pt. Scheduled to be induced, but seems to be progressing well without intervention - Will continue intermittent monitoring and encourage activity to augment labor #Pain: - Requests to not use epidural if possible - Considering pain medications and nitrous oxide #FWB: category 1 strip #ID:GBS: neg #MOF: Breast feeding #MOC:Undecided, Discussed LARC options in detail #Circ:  Yes- discussed at length the payment option for circ  Adrienne Griffin, Med Student  12/14/2015, 10:13 AM   OB fellow attestation: I have seen and examined this patient; I agree with above documentation in the medical student's note.  I have edited and confirmed all elements   Adrienne Griffin is a 25  y.o. G1P0 here for PDIOL but in active labor  PE: BP 132/82 mmHg  Pulse 96  Temp(Src) 98.7 F (37.1 C) (Oral)  Resp 20  Ht 4\' 11"  (1.499 m)  Wt 197 lb 3.2 oz (89.449 kg)  BMI 39.81 kg/m2  LMP 03/02/2015 (Exact Date) Gen: calm comfortable, NAD Resp: normal effort, no distress Abd: gravid  ROS, labs, PMH reviewed  Plan: Admit to LD Labor: Expectant management  FWB: Cat I ID: GBS Neg  Federico FlakeKimberly Niles Atreus Hasz, MD  Family Medicine, OB Fellow 12/14/2015, 1:35 PM

## 2015-12-14 NOTE — Progress Notes (Signed)
Adrienne Griffin is a 25 y.o. G1P0 at 6039w0d admitted for IOL/Active Labor.  Subjective: Reports feeling comfortable with epidural.  Objective: BP 127/78 mmHg  Pulse 101  Temp(Src) 98.8 F (37.1 C) (Oral)  Resp 18  Ht 4\' 11"  (1.499 m)  Wt 89.449 kg (197 lb 3.2 oz)  BMI 39.81 kg/m2  LMP 03/02/2015 (Exact Date) I/O last 3 completed shifts: In: -  Out: 450 [Urine:450]    FHT:  FHR: 140 bpm, variability: moderate,  accelerations:  Present,  decelerations:  Present Variable UC:   regular SVE:   Dilation: 9 Effacement (%): 100 Station: -1 Exam by:: Dr Caroleen Hammanumley MD  Labs: Lab Results  Component Value Date   WBC 7.9 12/14/2015   HGB 10.5* 12/14/2015   HCT 31.8* 12/14/2015   MCV 78.7 12/14/2015   PLT 272 12/14/2015    Assessment / Plan: Induction of labor due to postterm,  progressing well on pitocin  Labor: Progressing well on Pitocin. AROM at 10:30pm Preeclampsia:  no signs or symptoms of toxicity Fetal Wellbeing:  Category II Pain Control:  Epidural I/D:  n/a Anticipated MOD:  NSVD  [facility] Rumley 12/14/2015, 10:33 PM

## 2015-12-14 NOTE — MAU Note (Addendum)
Contractions all day yesterday but worse since MN. Denies LOF but some bloody show. Pt for induction at 0730 today but BS unable to take pt currently and pt uncomfortable from ctxs.

## 2015-12-14 NOTE — Progress Notes (Signed)
Labor Progress Note Adrienne Griffin is a 25 y.o. G1P0 at 1026w0d presented for PD-IOL but was in labor S: strong and frequent ctx. Reports not coping well and desires pain control.   O:  BP 132/82 mmHg  Pulse 96  Temp(Src) 98.7 F (37.1 C) (Oral)  Resp 20  Ht 4\' 11"  (1.499 m)  Wt 197 lb 3.2 oz (89.449 kg)  BMI 39.81 kg/m2  LMP 03/02/2015 (Exact Date) EFM: 140/mod/+accels, no decels  CVE: Dilation: 5 Effacement (%): 90 Cervical Position: Middle Station: -2 Presentation: Vertex Exam by:: Dr. Alvester MorinNewton   A&P: 25 y.o. G1P0 6526w0d here for PDIOL but in labor #Labor: Expectant managment #Pain: Patient may have epidural #FWB: Cat I #GBS Neg  Adrienne FlakeKimberly Niles [name], MD 1:33 PM

## 2015-12-14 NOTE — Anesthesia Pain Management Evaluation Note (Signed)
  CRNA Pain Management Visit Note  Patient: Adrienne Griffin, 25 y.o., female  "Hello [facility]. We have an anesthesia team available at all times to provide care throughout the hospital, including epidural management and anesthesia for C-section. I don't know your plan for the delivery whether it a natural birth, water birth, IV sedation, nitrous supplementation, doula or epidural, but we want to meet your pain goals."   1.Was your pain managed to your expectations on prior hospitalizations?   No prior hospitalizations  2.What is your expectation for pain management during this hospitalization?     Epidural  3.How can we help you reach that goal?  Record the patient's initial score and the patient's pain goal.   Pain: 0  Pain Goal: 5 [facility] wants you to be able to say your pain was always managed very well.  Vonita Calloway 12/14/2015

## 2015-12-15 ENCOUNTER — Encounter (HOSPITAL_COMMUNITY): Payer: Self-pay

## 2015-12-15 DIAGNOSIS — O411230 Chorioamnionitis, third trimester, not applicable or unspecified: Secondary | ICD-10-CM

## 2015-12-15 DIAGNOSIS — O480 Post-term pregnancy: Secondary | ICD-10-CM

## 2015-12-15 DIAGNOSIS — Z3A41 41 weeks gestation of pregnancy: Secondary | ICD-10-CM

## 2015-12-15 MED ORDER — SENNOSIDES-DOCUSATE SODIUM 8.6-50 MG PO TABS
2.0000 | ORAL_TABLET | ORAL | Status: DC
  Administered 2015-12-16 (×2): 2 via ORAL
  Filled 2015-12-15 (×2): qty 2

## 2015-12-15 MED ORDER — TETANUS-DIPHTH-ACELL PERTUSSIS 5-2.5-18.5 LF-MCG/0.5 IM SUSP: 0.5000 mL | Freq: Once | INTRAMUSCULAR | Status: DC

## 2015-12-15 MED ORDER — MEASLES, MUMPS & RUBELLA VAC ~~LOC~~ INJ
0.5000 mL | INJECTION | Freq: Once | SUBCUTANEOUS | Status: DC
  Filled 2015-12-15: qty 0.5

## 2015-12-15 MED ORDER — WITCH HAZEL-GLYCERIN EX PADS: 1.0000 "application " | MEDICATED_PAD | CUTANEOUS | Status: DC | PRN

## 2015-12-15 MED ORDER — SODIUM CHLORIDE 0.9 % IV SOLN
2.0000 g | Freq: Four times a day (QID) | INTRAVENOUS | Status: AC
  Administered 2015-12-15 – 2015-12-16 (×4): 2 g via INTRAVENOUS
  Filled 2015-12-15 (×4): qty 2000

## 2015-12-15 MED ORDER — SIMETHICONE 80 MG PO CHEW: 80.0000 mg | CHEWABLE_TABLET | ORAL | Status: DC | PRN

## 2015-12-15 MED ORDER — ZOLPIDEM TARTRATE 5 MG PO TABS: 5.0000 mg | ORAL_TABLET | Freq: Every evening | ORAL | Status: DC | PRN

## 2015-12-15 MED ORDER — IBUPROFEN 600 MG PO TABS
600.0000 mg | ORAL_TABLET | Freq: Four times a day (QID) | ORAL | Status: DC
  Administered 2015-12-15 – 2015-12-17 (×9): 600 mg via ORAL
  Filled 2015-12-15 (×9): qty 1

## 2015-12-15 MED ORDER — ONDANSETRON HCL 4 MG PO TABS: 4.0000 mg | ORAL_TABLET | ORAL | Status: DC | PRN

## 2015-12-15 MED ORDER — ACETAMINOPHEN 325 MG PO TABS: 650.0000 mg | ORAL_TABLET | ORAL | Status: DC | PRN

## 2015-12-15 MED ORDER — DIPHENHYDRAMINE HCL 25 MG PO CAPS: 25.0000 mg | ORAL_CAPSULE | Freq: Four times a day (QID) | ORAL | Status: DC | PRN

## 2015-12-15 MED ORDER — DIBUCAINE 1 % RE OINT: 1.0000 "application " | TOPICAL_OINTMENT | RECTAL | Status: DC | PRN

## 2015-12-15 MED ORDER — PRENATAL MULTIVITAMIN CH
1.0000 | ORAL_TABLET | Freq: Every day | ORAL | Status: DC
  Administered 2015-12-15 – 2015-12-17 (×3): 1 via ORAL
  Filled 2015-12-15 (×3): qty 1

## 2015-12-15 MED ORDER — BENZOCAINE-MENTHOL 20-0.5 % EX AERO
1.0000 "application " | INHALATION_SPRAY | CUTANEOUS | Status: DC | PRN
  Filled 2015-12-15: qty 56

## 2015-12-15 MED ORDER — GENTAMICIN SULFATE 40 MG/ML IJ SOLN
150.0000 mg | Freq: Three times a day (TID) | INTRAVENOUS | Status: AC
  Administered 2015-12-15 – 2015-12-16 (×3): 150 mg via INTRAVENOUS
  Filled 2015-12-15 (×4): qty 3.75

## 2015-12-15 MED ORDER — ONDANSETRON HCL 4 MG/2ML IJ SOLN: 4.0000 mg | INTRAMUSCULAR | Status: DC | PRN

## 2015-12-15 MED ORDER — GENTAMICIN SULFATE 40 MG/ML IJ SOLN
160.0000 mg | Freq: Once | INTRAVENOUS | Status: AC
  Administered 2015-12-15: 160 mg via INTRAVENOUS
  Filled 2015-12-15: qty 4

## 2015-12-15 MED ORDER — COCONUT OIL OIL
1.0000 "application " | TOPICAL_OIL | Status: DC | PRN
  Administered 2015-12-16: 1 via TOPICAL
  Filled 2015-12-15: qty 120

## 2015-12-15 MED ORDER — GENTAMICIN SULFATE 40 MG/ML IJ SOLN
150.0000 mg | Freq: Three times a day (TID) | INTRAVENOUS | Status: DC
  Filled 2015-12-15: qty 3.75

## 2015-12-15 MED ORDER — SODIUM CHLORIDE 0.9 % IV SOLN
2.0000 g | Freq: Four times a day (QID) | INTRAVENOUS | Status: DC
  Administered 2015-12-15: 2 g via INTRAVENOUS
  Filled 2015-12-15 (×2): qty 2000

## 2015-12-15 NOTE — Anesthesia Postprocedure Evaluation (Signed)
Anesthesia Post Note  Patient: Adrienne Griffin  Procedure(s) Performed: * No procedures listed *  Patient location during evaluation: Mother Baby Anesthesia Type: Epidural Level of consciousness: oriented and awake and alert Pain management: pain level controlled Vital Signs Assessment: post-procedure vital signs reviewed and stable Respiratory status: spontaneous breathing and nonlabored ventilation Cardiovascular status: stable Postop Assessment: epidural receding, patient able to bend at knees, no signs of nausea or vomiting and adequate PO intake Anesthetic complications: no     Last Vitals:  Filed Vitals:   12/15/15 1030 12/15/15 1145  BP: 122/73 121/82  Pulse: 105 116  Temp: 37 C 37.4 C  Resp: 20 20    Last Pain:  Filed Vitals:   12/15/15 1152  PainSc: 10-Worst pain ever   Pain Goal: Patients Stated Pain Goal: 0 (12/15/15 1030)               Syrus Nakama Hristova

## 2015-12-15 NOTE — Progress Notes (Signed)
Adrienne Griffin is a 25 y.o. G1P0 at 6338w1d admitted for IOL/Active Labor.  Subjective: Comfortable with epidural.  Objective: BP 138/87 mmHg  Pulse 110  Temp(Src) 99.9 F (37.7 C) (Oral)  Resp 16  Ht 4\' 11"  (1.499 m)  Wt 89.449 kg (197 lb 3.2 oz)  BMI 39.81 kg/m2  SpO2 99%  LMP 03/02/2015 (Exact Date) I/O last 3 completed shifts: In: -  Out: 450 [Urine:450]    FHT:  FHR: 150 bpm, variability: minimal ,  accelerations:  Present,  decelerations:  Absent UC:   regular SVE:   Dilation: 10 Effacement (%): 100 Station: 0 Exam by:: Adrienne ShirtsV Rogers RN   Labs: Lab Results  Component Value Date   WBC 7.9 12/14/2015   HGB 10.5* 12/14/2015   HCT 31.8* 12/14/2015   MCV 78.7 12/14/2015   PLT 272 12/14/2015    Assessment / Plan: Induction of labor due to postterm,  progressing well on pitocin  Labor: Progressing normally on Pitocin. AROM.  Preeclampsia:  no signs or symptoms of toxicity Fetal Wellbeing:  Category II Pain Control:  Epidural I/D:  n/a Anticipated MOD:  NSVD  [facility] Rumley 12/15/2015, 3:07 AM

## 2015-12-15 NOTE — Lactation Note (Signed)
This note was copied from a baby's chart. Lactation Consultation Note  Patient Name: Adrienne March RummageCharlotte Sessler ZOXWR'UToday's Date: 12/15/2015 Reason for consult: Initial assessment  Baby 12 hours old. Mom reports difficulty getting the baby latched and asking if she needs to start pumping. Demonstrated to mom that her breasts are easily compressible and expressible. Discussed with mom that she has plenty of colostrum and the baby is her best pump as long as he is willing to suckle at breast. Assisted with latching baby in football position to left breast. Baby latched deeply, suckled rhythmically with intermittent swallows noted. Demonstrated to parents how to flange lower lip and mom reported increased comfort. Enc parents to nurse with cues and offer lots of STS. Parents have room full of visitors. Enc parents to concentrate on nursing the baby. Mom given [facility] brochure, aware of OP/BFSG and LC phone line assistance after D/C.  Maternal Data Has patient been taught Hand Expression?: Yes Does the patient have breastfeeding experience prior to this delivery?: No  Feeding Feeding Type: Breast Fed Length of feed:  (LC assessed first 10 minutes of BF. )  LATCH Score/Interventions Latch: Grasps breast easily, tongue down, lips flanged, rhythmical sucking.  Audible Swallowing: Spontaneous and intermittent Intervention(s): Skin to skin;Hand expression  Type of Nipple: Everted at rest and after stimulation  Comfort (Breast/Nipple): Soft / non-tender     Hold (Positioning): Assistance needed to correctly position infant at breast and maintain latch. Intervention(s): Breastfeeding basics reviewed;Support Pillows;Position options;Skin to skin  LATCH Score: 9  Lactation Tools Discussed/Used     Consult Status Consult Status: Follow-up Date: 12/16/15 Follow-up type: In-patient    Geralynn OchsWILLIARD, Rissa Turley 12/15/2015, 8:36 PM

## 2015-12-15 NOTE — Progress Notes (Signed)
ANTIBIOTIC CONSULT NOTE - INITIAL  Pharmacy Consult for Gentamicin Indication: Chorioamnionitis  No Known Allergies  Patient Measurements: Height: 4\' 11"  (149.9 cm) Weight: 197 lb 3.2 oz (89.449 kg) IBW/kg (Calculated) : 43.2 Adjusted Body Weight: 57.1 kg  Vital Signs: Temp: 100.9 F (38.3 C) (05/21 0509) Temp Source: Oral (05/21 0509) BP: 125/69 mmHg (05/21 0500) Pulse Rate: 107 (05/21 0500)  Labs:  Recent Labs  12/14/15 0725  WBC 7.9  HGB 10.5*  PLT 272   No results for input(s): GENTTROUGH, GENTPEAK, GENTRANDOM in the last 72 hours.   Microbiology: No results found for this or any previous visit (from the past 720 hour(s)).  Medications:  Ampicillin 2 GM IV every 6 hours  Assessment: 25 y.o. female G1P0 at 3537w1d admitted for IOL Estimated Ke = 0.437, Vd = 22.3 Liters  Goal of Therapy:  Gentamicin peak 6-8 mg/L and Trough < 1 mg/L  Plan:  Gentamicin 160 mg IV x 1  Gentamicin 150 mg IV every 8 hrs  Check Scr with next labs if gentamicin continued. Will check gentamicin levels if continued > 72hr or clinically indicated.  Arelia SneddonMason, Taijon Vink Anne 12/15/2015,5:25 AM

## 2015-12-16 NOTE — Progress Notes (Signed)
Post Partum Day 1 Subjective: no complaints, up ad lib, voiding and tolerating PO  Objective: Blood pressure 107/63, pulse 93, temperature 97.3 F (36.3 C), temperature source Axillary, resp. rate 20, height 4\' 11"  (1.499 m), weight 197 lb 3.2 oz (89.449 kg), last menstrual period 03/02/2015, SpO2 98 %, unknown if currently breastfeeding.  Physical Exam:  General: alert, cooperative, appears stated age and no distress Lochia: appropriate Uterine Fundus: firm Incision: n/a DVT Evaluation: Negative Homan's sign. No cords or calf tenderness. No significant calf/ankle edema.   Recent Labs  12/14/15 0725  HGB 10.5*  HCT 31.8*    Assessment/Plan: Plan for discharge tomorrow   LOS: 2 days   Adrienne Griffin 12/16/2015, 7:24 AM

## 2015-12-16 NOTE — Lactation Note (Signed)
This note was copied from a baby's chart. Lactation Consultation Note Needs repeative teaching ans assistance w/BF. Demonstrated to mom how to correctly hand express w/small amount of colostrum noted. Room was very hot, had 2 blankets on baby and a hat. Discussed stimulation and need for hand expression to stimulate baby to BF. Discussed stimulation and also safety if baby not to be so hot. Mom doesn't have much drive, wants staff to latch baby. Requested formula once, reviewed risk of supplementing.  Patient Name: Adrienne March RummageCharlotte Bury UJWJX'BToday's Date: 12/16/2015 Reason for consult: Follow-up assessment;Difficult latch   Maternal Data    Feeding Feeding Type: Breast Fed  LATCH Score/Interventions Latch: Repeated attempts needed to sustain latch, nipple held in mouth throughout feeding, stimulation needed to elicit sucking reflex.  Audible Swallowing: None Intervention(s): Hand expression Intervention(s): Hand expression;Alternate breast massage  Type of Nipple: Everted at rest and after stimulation  Comfort (Breast/Nipple): Filling, red/small blisters or bruises, mild/mod discomfort  Problem noted: Mild/Moderate discomfort Interventions (Mild/moderate discomfort): Hand massage;Hand expression  Hold (Positioning): Assistance needed to correctly position infant at breast and maintain latch. Intervention(s): Skin to skin;Position options;Support Pillows  LATCH Score: 5  Lactation Tools Discussed/Used WIC Program: Yes Pump Review: Setup, frequency, and cleaning;Milk Storage Initiated by:: Peri JeffersonL. Sharline Lehane RN Date initiated:: 12/16/15   Consult Status Consult Status: Follow-up Date: 12/16/15 Follow-up type: In-patient    Charyl DancerCARVER, [name] 12/16/2015, 5:18 AM

## 2015-12-16 NOTE — Lactation Note (Signed)
This note was copied from a baby's chart. Lactation Consultation Note  Patient Name: Adrienne Griffin UJWJX'BToday's Date: 12/16/2015   Visited with Mom and FOB, at 5133 hrs old.  Baby under single phototherapy, and becoming very sleepy on the breast.  Reassured Mom that this would be temporary, as it is due to jaundice.  Baby to be given 10-20 ml Alimentum+/EBM by slow flow bottle (pace method of feeding demonstrated).  Mom to pump after breast feeding, or 8 times in 24 hrs. Encouraged her to massage her breasts, and have baby skin to skin as much as she can.  Mom teary eyed.  Reassured her that baby would continue to be able to breast feed, and we can help her after discharge if needed.  To ask for help as needed.     Adrienne Griffin, Adrienne Griffin 12/16/2015, 5:47 PM

## 2015-12-17 DIAGNOSIS — O411290 Chorioamnionitis, unspecified trimester, not applicable or unspecified: Secondary | ICD-10-CM | POA: Diagnosis not present

## 2015-12-17 MED ORDER — IBUPROFEN 600 MG PO TABS: 600.0000 mg | ORAL_TABLET | Freq: Four times a day (QID) | ORAL | Status: DC

## 2015-12-17 NOTE — Lactation Note (Signed)
This note was copied from a baby's chart. Lactation Consultation Note  Patient Name: Adrienne March RummageCharlotte Griffin ZOXWR'UToday's Date: 12/17/2015 Reason for consult: Follow-up assessment;Breast/nipple pain;Difficult latch   Follow up with mom's request. Mom was trying to latch infant and he was crying. Mom reports that nipples are very sore with feeding. Mom was wincing and pulling away from baby while he was trying to latch. Assessed infant's mouth, he was noted to pull tongue back in mouth with suckling and biting. Enc mom to do suck training prior to each feeding.   Assisted mom in latching infant to both breasts. Mom with good support of infant and breast and infant noted to have flanged lips and strong suckle. Pain was moderate to mom and did not improve with feeding. Infant was removed from breast and was supplemented by FOB. Mom reports she is pumping some and not seeing any colostrum. Breasts are soft and compressible with everted nipples, no colostrum visible. Nipple tissue intact. Nipple tissue somewhat compressed after infant BF.   Offered mom NS to see if we could widen infant latch and extend tongue, she declined at this time. Enc her to BF at least 8-12 x in 24 hours at first feeding cues and follow feeding with supplementation. Advised mom to continue pumping every 2-3 hours post BF or in place of BF and to feed all EBM back to infant, adding formula as needed. Parents have been feeding infant 15 cc/feeding and are worried he is still hungry, advised them to increase to at least 30 ml/feeding.   Infant not to be d/c home today as monitoring bilirubin. Follow up tomorrow and prn. Advised mom to call for Lactation if she would like to try NS.   Maternal Data    Feeding Feeding Type: Breast Fed Length of feed: 25 min  LATCH Score/Interventions Latch: Grasps breast easily, tongue down, lips flanged, rhythmical sucking. Intervention(s): Adjust position;Assist with latch;Breast massage;Breast  compression  Audible Swallowing: None Intervention(s): Hand expression Intervention(s): Alternate breast massage  Type of Nipple: Everted at rest and after stimulation  Comfort (Breast/Nipple): Filling, red/small blisters or bruises, mild/mod discomfort  Problem noted: Mild/Moderate discomfort Interventions (Mild/moderate discomfort): Hand expression (EMB/Coconut oil to nipples)  Hold (Positioning): Assistance needed to correctly position infant at breast and maintain latch. Intervention(s): Breastfeeding basics reviewed;Position options;Support Pillows;Skin to skin  LATCH Score: 6  Lactation Tools Discussed/Used WIC Program: Yes Pump Review: Setup, frequency, and cleaning;Milk Storage   Consult Status Consult Status: Follow-up Date: 12/18/15 Follow-up type: In-patient    Silas FloodSharon S Jonella Redditt 12/17/2015, 1:44 PM

## 2015-12-17 NOTE — Lactation Note (Signed)
This note was copied from a baby's chart. Lactation Consultation Note Baby lost 6% weight loss w/little output. Mom BF better, using DEBP, w/no colostrum pumped. Hand expressed easy flow colostrum. Mom has coconut oil, encouraged to use colostrum as well for soreness. Baby on SPT. Encouraged strict I&O.  Patient Name: Boy March RummageCharlotte Ocallaghan ZOXWR'UToday's Date: 12/17/2015 Reason for consult: Follow-up assessment;Hyperbilirubinemia;Infant weight loss   Maternal Data    Feeding Feeding Type: Breast Milk with Formula added Nipple Type: Slow - flow Length of feed: 20 min  LATCH Score/Interventions Latch: Repeated attempts needed to sustain latch, nipple held in mouth throughout feeding, stimulation needed to elicit sucking reflex. Intervention(s): Assist with latch;Breast massage;Breast compression  Audible Swallowing: A few with stimulation Intervention(s): Hand expression Intervention(s): Alternate breast massage  Type of Nipple: Everted at rest and after stimulation  Comfort (Breast/Nipple): Filling, red/small blisters or bruises, mild/mod discomfort  Problem noted: Mild/Moderate discomfort Interventions (Mild/moderate discomfort): Hand massage;Hand expression  Hold (Positioning): Assistance needed to correctly position infant at breast and maintain latch. Intervention(s): Support Pillows;Position options  LATCH Score: 6  Lactation Tools Discussed/Used Tools: Pump Breast pump type: Double-Electric Breast Pump   Consult Status Consult Status: Follow-up Date: 12/17/15 (in pm) Follow-up type: In-patient    Adi Doro, Diamond NickelLAURA G 12/17/2015, 2:10 AM

## 2015-12-17 NOTE — Lactation Note (Signed)
This note was copied from a baby's chart. Lactation Consultation Note: Mother just finished a 25 mins feeding .  She states that breastfeeding going well;  Although mother states that infant pinched her nipple and she has a slight ridge across nipple when infant released. Reviewed proper positioning and adjusting infants lower jaw for wider gape. Mother states that staff nurse at the bedside and showed her how to adjust infants lips for comfort. Mother was given comfort gels. Advised to rotates positions and use good support.  Engorgement treatment highlighted in baby and me book. Advised mother to continue to breastfeed with feeding cues at least 8-12/24hours.   Mother is aware of available LC services, BFSG's.   Patient Name: Adrienne March RummageCharlotte Bells QMVHQ'IToday's Date: 12/17/2015 Reason for consult: Follow-up assessment   Maternal Data    Feeding Feeding Type: Breast Fed Length of feed: 25 min  LATCH Score/Interventions Latch: Repeated attempts needed to sustain latch, nipple held in mouth throughout feeding, stimulation needed to elicit sucking reflex. Intervention(s): Adjust position;Assist with latch;Breast massage;Breast compression  Audible Swallowing: A few with stimulation Intervention(s): Hand expression Intervention(s): Skin to skin  Type of Nipple: Everted at rest and after stimulation  Comfort (Breast/Nipple): Filling, red/small blisters or bruises, mild/mod discomfort  Problem noted: Mild/Moderate discomfort Interventions (Mild/moderate discomfort): Hand massage;Hand expression  Hold (Positioning): Assistance needed to correctly position infant at breast and maintain latch.  LATCH Score: 6  Lactation Tools Discussed/Used     Consult Status      Michel BickersKendrick, Naevia Unterreiner McCoy 12/17/2015, 10:50 AM

## 2015-12-17 NOTE — Discharge Summary (Signed)
OB Discharge Summary     Patient Name: Adrienne Griffin DOB: 1991/04/17 MRN: [id number]  Date of admission: 12/14/2015 Delivering MD: Shonna Chock BEDFORD   Date of discharge: 12/17/2015  Admitting diagnosis: 41wks, contractions Intrauterine pregnancy: [redacted]w[redacted]d     Secondary diagnosis:  Principal Problem:   Vacuum extractor delivery, delivered Active Problems:   Supervision of normal pregnancy   Obesity in pregnancy, antepartum   Post term pregnancy   Chorioamnionitis, delivered, current hospitalization   Second-degree perineal laceration, with delivery  Additional problems: none     Discharge diagnosis: Term Pregnancy Delivered and Suspected Triple I (Chorioamnionitis)                                                                                                Post partum procedures:none  Augmentation: AROM and Pitocin  Complications: Intrauterine Inflammation or infection (Chorioamniotis)  Hospital course:  Onset of Labor With Vaginal Delivery     25 y.o. yo G1P1001 at [redacted]w[redacted]d was admitted in Active Labor on 12/14/2015. Patient had an uncomplicated labor course as follows:  Membrane Rupture Time/Date: 10:29 PM ,12/14/2015   Intrapartum Procedures: Episiotomy: None [1]                                         Lacerations:  2nd degree [3];Perineal [11]  Patient had a delivery of a Viable infant. 12/15/2015  Information for the patient's newborn:  Pegah, Segel [[id number]]  Delivery Method: Vaginal, Vacuum (Extractor) (Filed from Delivery Summary)  Pateint had an uncomplicated postpartum course.  She is ambulating, tolerating a regular diet, passing flatus, and urinating well. Patient is discharged home in stable condition on 12/17/2015.    Physical exam  Filed Vitals:   12/16/15 0005 12/16/15 0555 12/16/15 1800 12/17/15 0519  BP: 110/60 107/63 110/64 124/77  Pulse: 94 93 99 98  Temp: 98.4 F (36.9 C) 97.3 F (36.3 C) 98.1 F (36.7 C) 98 F (36.7 C)  TempSrc:  Oral Axillary Oral Oral  Resp: Height:      Weight:      SpO2: 98%      General: alert, cooperative and no distress Lochia: appropriate Uterine Fundus: firm Incision: N/A DVT Evaluation: No evidence of DVT seen on physical exam. Negative Homan's sign. Labs: Lab Results  Component Value Date   WBC 7.9 12/14/2015   HGB 10.5* 12/14/2015   HCT 31.8* 12/14/2015   MCV 78.7 12/14/2015   PLT 272 12/14/2015   CMP Latest Ref Rng 09/19/2015  Glucose 65 - 104 mg/dL 72  BUN 6 - 20 mg/dL -  Creatinine 7.82 - 9.56 mg/dL -  Sodium 213 - 086 mmol/L -  Potassium 3.5 - 5.1 mmol/L -  Chloride 101 - 111 mmol/L -  CO2 19 - 32 mEq/L -  Calcium 8.4 - 10.5 mg/dL -    Discharge instruction: per After Visit Summary and "Baby and Me Booklet".  After visit meds:    Medication List  TAKE these medications        Breast Pump Misc  Dispense one breast pump for patient     ibuprofen 600 MG tablet  Commonly known as:  ADVIL,MOTRIN  Take 1 tablet (600 mg total) by mouth every 6 (six) hours.        Diet: routine diet  Activity: Advance as tolerated. Pelvic rest for 6 weeks.   Outpatient follow up:6 weeks Follow up Appt:Future Appointments Date Time Provider Department Center  01/21/2016 10:15 AM Tereso NewcomerUgonna A Anyanwu, MD [facility]   Postpartum contraception: LARC- Nexplanon vs IUD  Newborn Data: Live born female  Birth Weight: 7 lb 9.5 oz (3445 g) APGAR: , 9  Baby Feeding: Breast Disposition:home with mother   12/17/2015 Federico FlakeKimberly Niles Leva Baine, MD

## 2015-12-18 ENCOUNTER — Ambulatory Visit: Payer: Self-pay

## 2015-12-18 NOTE — Lactation Note (Signed)
This note was copied from a baby's chart. Lactation Consultation Note  Patient Name: Boy March RummageCharlotte Springborn QIONG'EToday's Date: 12/18/2015   Baby 72 hrs old, phototherapy DC's at 12 mn.  7 am bilirubin stable at 9.0.  Mom continuing to offer formula supplement 10-25 ml by bottle after breast feeding.   Mom has not been double pumping as instructed to do 2 days ago, but [facility]iring about [facility].  Demonstrated how to use the manual pump. Talked about importance of increasing breast feeding frequency now.  Demonstrated manual breast expression, and transitional milk squirted from nipple. Mom describes latches are painful.  Baby just fed 25 ml of formula, following 10 mins of bfing.  Encouraged her to keep baby skin to skin, and to [facility] when baby begins to root and cue that he is hungry.  LC to assist and assess next feeding at the breast, prior to going home.    Judee ClaraSmith, Kieli Golladay E 12/18/2015, 8:37 AM

## 2015-12-18 NOTE — Lactation Note (Addendum)
This note was copied from a baby's chart. Lactation Consultation Note  Patient Name: Boy March RummageCharlotte Shannahan ZOXWR'UToday's Date: 12/18/2015 Reason for consult: Follow-up assessment;Breast/nipple pain  Mom called out for assistance with postioning and latch at the breast.  Mom seems very uncertain and hesitant when maneuvering baby into position.  Mom in chair, and offered to assist with football hold.  Teaching done while assisting and baby was able to latch onto breast deeply, and regular sucking audible with deep jaw extensions.  Mom has colostrum easily expressed from breast.  Mom complaining of a lot of pain during the feeding.  Took baby off, and re-latched with minimal help from [facility], but Mom wincing with pain.  Baby does clamp hard with jaws, noted when finger introduced to unlatch.  Initiated a nipple shield to see if this barrier would assist baby in relaxing his jaw and opening more widely on the breast.  Tried with 24 mm and 20 mm nipple shields.  Baby appeared to pinch harder with nipple shield, and pain was unbearable for Mom.  Talked about using jaw massage to help relax baby's jaw.  Assisted with use of cross cradle hold and baby appeared to latch widely, and swallowing heard, but Mom too uncomfortable to continue.  Dad to feed baby Alimentum, and LC assisted with double pumping.  Mom complained about pain at one point, with the pumping, suction strength decreased and it improved.  Talked about [facility], and how that would work.  Recommended an OP lactation appointment to follow up on baby's tight latch.  Mom not sure about this plan.  Paperwork left in room, for pump loaner.  [facility] hear from [facility].  She will call out if she chooses to continue.  Explained that she would need to pump >8 times in 24 hrs if baby wasn't breastfeeding, and we could help her as an OP.  Recommended she use expressed breast milk and coconut oil for her sore nipples.  Maternal Data     Feeding Feeding Type: Breast Fed Nipple Type: Slow - flow Length of feed: 10 min (2 different positions)  LATCH Score/Interventions Latch: Grasps breast easily, tongue down, lips flanged, rhythmical sucking. Intervention(s): Breast compression;Breast massage;Assist with latch;Adjust position  Audible Swallowing: Spontaneous and intermittent Intervention(s): Skin to skin;Hand expression Intervention(s): Skin to skin;Hand expression;Alternate breast massage  Type of Nipple: Everted at rest and after stimulation  Comfort (Breast/Nipple): Engorged, cracked, bleeding, large blisters, severe discomfort  Problem noted: Severe discomfort Interventions (Mild/moderate discomfort): Hand massage;Hand expression;Pre-pump if needed;Post-pump Interventions (Severe discomfort): Double electric pum;Observe pumping  Hold (Positioning): Assistance needed to correctly position infant at breast and maintain latch. Intervention(s): Breastfeeding basics reviewed;Support Pillows;Position options;Skin to skin  LATCH Score: 7  Lactation Tools Discussed/Used Tools: Nipple Shields Nipple shield size: 20 Breast pump type: Double-Electric Breast Pump WIC Program: Yes Pump Review: Setup, frequency, and cleaning;Milk Storage Initiated by:: Johny Blameraroline Zarra Geffert RN IBCLC Date initiated:: 12/17/15   Consult Status Consult Status: Follow-up Date: 12/18/15 Follow-up type: In-patient    Judee ClaraSmith, Zarina Pe E 12/18/2015, 11:33 AM

## 2015-12-19 ENCOUNTER — Ambulatory Visit: Payer: Self-pay

## 2015-12-19 NOTE — Lactation Note (Addendum)
This note was copied from a baby's chart. Lactation Consultation Note  Patient Name: Adrienne March RummageCharlotte Griffin ZOXWR'UToday's Date: 12/19/2015 Reason for consult: Follow-up assessment;Pump rental   Followed up with mom at her request. Infant was returning to room from circumcision.  Mom just finished pumping and received 50 cc EBM. Mom reports she left suction on low and pumping was tolerable. Had her change to maintenance setting and increase suction as able. [facility] before delivery and continue pumping. [place] [facility] [facility]  Reviewed pumping every 2-3 hours for about 15 minutes or 2 minutes past milk flow stopping. Reviewed engorgement prevention/treatment with mom. Mom declined to make an OP appointment today, she would like to get home and call back if she wants an appointment.   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 Infant with f/u Ped appt tomorrow.  Reviewed LC Brochure, parents aware of OP Services, BF Support Groups and LC phone #. Enc mom to call with any questions/concerns.    Maternal Data Does the patient have breastfeeding experience prior to this delivery?: No  Feeding    LATCH Score/Interventions                      Lactation Tools Discussed/Used WIC Program: Yes Adrienne Griffin([place]) Pump Review: Setup, frequency, and cleaning;Milk Storage   Consult Status Consult Status: Complete Follow-up type: Call as needed    Adrienne Griffin 12/19/2015, 12:27 PM

## 2015-12-19 NOTE — Lactation Note (Signed)
This note was copied from a baby's chart. Lactation Consultation Note  Patient Name: Boy March RummageCharlotte Claybrook JWJXB'JToday's Date: 12/19/2015 Reason for consult: Follow-up assessment;Pump rental   Mom reports she has an appointment with WIC at 3 pm to get a pump. Enc mom to call with questions/concerns prn.   Maternal Data Does the patient have breastfeeding experience prior to this delivery?: No  Feeding    LATCH Score/Interventions                      Lactation Tools Discussed/Used WIC Program: Yes [facility]([place]) Pump Review: Setup, frequency, and cleaning;Milk Storage   Consult Status Consult Status: Complete Follow-up type: Call as needed    Ed BlalockSharon S Jhordan Kinter 12/19/2015, 2:02 PM

## 2015-12-19 NOTE — Lactation Note (Addendum)
This note was copied from a baby's chart. Lactation Consultation Note  Patient Name: Boy March RummageCharlotte Croucher NWGNF'AToday's Date: 12/19/2015 Reason for consult: Follow-up assessment   Follow up with mom of 4 do infant. Infant with 2 BF for 10-15 minutes, bottle of formula x 5 of 14-35 cc, 4 voids and 2 stools in last 24 prior to this assessment. Infant weight 7 lb 4.4 oz with weight loss of 4% since birth.  Mom reports she is not BF infant due to the pain. She has not pumped since yesterday. She reports pumping was painful and that suction was turned up high, she has not pumped on lower suction as recommended. She is unsure if she wants to pump. She has Pump rental information but it was put in her car. Mom wants to try pumping before leaving hospital and then decide if she wants to pump. FOB told mom he wanted her to pump.  Mom has my number to call if pump rental needed prior to d/c.   Reviewed all BF information in Taking Care of Baby and Me Booklet. Reviewed Engorgement prevention and treatment. Reviewed supply and demand and importance of pumping if infant not BF to establish and maintain a milk supply.  Reviewed LC Brochure, mom aware of OP services, BF Support Groups and LC phone #.   Maternal Data Does the patient have breastfeeding experience prior to this delivery?: No  Feeding    LATCH Score/Interventions                      Lactation Tools Discussed/Used WIC Program: Yes Pump Review: Setup, frequency, and cleaning;Milk Storage   Consult Status Consult Status: PRN Follow-up type: Call as needed    Ed BlalockSharon S Geonna Lockyer 12/19/2015, 10:24 AM

## 2015-12-30 ENCOUNTER — Encounter (HOSPITAL_COMMUNITY): Payer: Self-pay

## 2016-01-21 ENCOUNTER — Ambulatory Visit (INDEPENDENT_AMBULATORY_CARE_PROVIDER_SITE_OTHER): Payer: Medicaid Other | Admitting: Obstetrics & Gynecology

## 2016-01-21 NOTE — Progress Notes (Signed)
Post Partum Exam  Adrienne CitrinCharlotte Konte Griffin is a 25 y.o. 71P1001 female who presents for a postpartum visit. She is 5 weeks postpartum following a vacuum assisted vaginal delivery. I have fully reviewed the prenatal and intrapartum course, had a second degree laceratrion. The delivery was at 5262w1d gestational weeks.  Anesthesia: epidural. Postpartum course has been unremarkable. Baby's course has been unremarkable. Baby is feeding by bottle - Similac Advance. Bleeding no bleeding. Bowel function is normal. Bladder function is abnormal: constipation. Patient is not sexually active. Contraception method is none. Postpartum depression screening: negative.  The following portions of the patient's history were reviewed and updated as appropriate: allergies, current medications, past family history, past medical history, past social history, past surgical history and problem listNormal pap, negative GC and Chlam on 04/24/15.   Review of Systems Pertinent items noted in HPI and remainder of comprehensive ROS otherwise negative.   Objective:    BP 116/78 mmHg  Pulse 78  Resp 16  Ht 5\' 5"  (1.651 m)  Wt 211 lb (95.709 kg)  BMI 35.11 kg/m2  Breastfeeding? Yes  General:  alert and no distress   Breasts:  deferred  Lungs: clear to auscultation bilaterally  Heart:  regular rate and rhythm  Abdomen: soft, non-tender; bowel sounds normal; no masses,  no organomegaly   Vulva:  normal and well-healed laceration        Assessment:    Normal postpartum exam. Pap smear not done at today's visit.   Plan:   1. Contraception: condoms 2. Follow up as needed.    Jaynie CollinsUGONNA  ANYANWU, MD, FACOG Attending Obstetrician & Gynecologist, [facility]

## 2017-01-04 ENCOUNTER — Emergency Department (HOSPITAL_COMMUNITY)
Admission: EM | Admit: 2017-01-04 | Discharge: 2017-01-04 | Disposition: A | Discharge status: Home / Self Care | Payer: BLUE CROSS/BLUE SHIELD | Attending: Emergency Medicine | Admitting: Emergency Medicine

## 2017-01-04 ENCOUNTER — Encounter (HOSPITAL_COMMUNITY): Payer: Self-pay

## 2017-01-04 ENCOUNTER — Ambulatory Visit: Payer: BLUE CROSS/BLUE SHIELD | Admitting: Physician Assistant

## 2017-01-04 DIAGNOSIS — N390 Urinary tract infection, site not specified: Secondary | ICD-10-CM | POA: Diagnosis not present

## 2017-01-04 DIAGNOSIS — Z79899 Other long term (current) drug therapy: Secondary | ICD-10-CM | POA: Insufficient documentation

## 2017-01-04 LAB — URINALYSIS, ROUTINE W REFLEX MICROSCOPIC
Bilirubin Urine: NEGATIVE
GLUCOSE, UA: NEGATIVE mg/dL
Ketones, ur: NEGATIVE mg/dL
NITRITE: POSITIVE — AB
PH: 6 (ref 5.0–8.0)
PROTEIN: NEGATIVE mg/dL
SPECIFIC GRAVITY, URINE: 1.02 (ref 1.005–1.030)

## 2017-01-04 MED ORDER — CEPHALEXIN 500 MG PO CAPS: 500.0000 mg | ORAL_CAPSULE | Freq: Two times a day (BID) | ORAL | 0 refills | Status: AC

## 2017-01-04 MED ORDER — CEPHALEXIN 250 MG PO CAPS
500.0000 mg | ORAL_CAPSULE | Freq: Once | ORAL | Status: AC
  Administered 2017-01-04: 500 mg via ORAL
  Filled 2017-01-04: qty 2

## 2017-01-04 NOTE — ED Provider Notes (Signed)
[facility]-EMERGENCY DEPT Provider Note   CSN: [id number] Arrival date & time: 01/04/17  1058  By signing my name below, I, Adrienne Griffin, attest that this documentation has been prepared under the direction and in the presence of Adrienne Piliyler Avyn Coate, PA-C. Electronically Signed: Thelma BargeNick Griffin, Scribe. 01/04/17. 12:17 PM.  History   Chief Complaint Chief Complaint  Patient presents with  . Urinary Tract Infection   The history is provided by the patient. No language interpreter was used.    HPI Comments: Adrienne Griffin is a 26 y.o. female who presents to the Emergency Department complaining of constant, gradually worsening abdominal pressure that began 1 week ago. She has associated increased frequency and malodorous urine. No dysuria. Pt suspects she has a UTI because her symptoms are similar to when she has had them in the past. She denies fever, hematuria , or color changes to her urine. No vaginal bleeding/discharge. Denies back pain. No other symptoms noted.   Past Medical History:  Diagnosis Date  . Medical history non-contributory     There are no active problems to display for this patient.   Past Surgical History:  Procedure Laterality Date  . wisdom tooth removal  2010    OB History    Gravida Para Term Preterm AB Living   1 1 1     1    SAB TAB Ectopic Multiple Live Births         0 1       Home Medications    Prior to Admission medications   Medication Sig Start Date End Date Taking? Authorizing Provider  ibuprofen (ADVIL,MOTRIN) 600 MG tablet Take 1 tablet (600 mg total) by mouth every 6 (six) hours. 12/17/15   Federico FlakeNewton, [name], MD  Misc. Devices (BREAST PUMP) MISC Dispense one breast pump for patient 10/15/15   Allie Bossierove, Myra C, MD    Family History Family History  Problem Relation Age of Onset  . Hypertension Mother   . Diabetes Mother     Social History Social History  Substance Use Topics  . Smoking status: Never Smoker  . Smokeless tobacco: Never Used   . Alcohol use Yes     Comment: occasional pre-pregnancy     Allergies   Patient has no known allergies.   Review of Systems Review of Systems  Constitutional: Negative for fever.  Gastrointestinal: Positive for abdominal pain.  Genitourinary: Positive for frequency. Negative for dysuria and hematuria.       Positive for: malodorous urine     Physical Exam Updated Vital Signs BP (!) 127/91 (BP Location: Right Arm)   Pulse 85   Temp 98.3 F (36.8 C) (Oral)   Resp 18   Ht 4\' 11"  (1.499 m)   Wt 160 lb (72.6 kg)   LMP 12/08/2016 (Within Days)   SpO2 100%   BMI 32.32 kg/m   Physical Exam  Constitutional: She is oriented to person, place, and time. Vital signs are normal. She appears well-developed and well-nourished.  HENT:  Head: Normocephalic and atraumatic.  Right Ear: Hearing normal.  Left Ear: Hearing normal.  Eyes: Conjunctivae and EOM are normal. Pupils are equal, round, and reactive to light.  Neck: Normal range of motion. Neck supple.  Cardiovascular: Normal rate and regular rhythm.   Pulmonary/Chest: Effort normal.  Abdominal: Normal appearance and bowel sounds are normal. There is no tenderness. There is no rigidity, no rebound, no guarding, no CVA tenderness, no tenderness at McBurney's point and negative Murphy's sign.  Normal active  bowel sounds in all 4 quadrants  Genitourinary:  Genitourinary Comments: No TTP, no tenderness to suprapubic area  Neurological: She is alert and oriented to person, place, and time.  Skin: Skin is warm and dry.  Psychiatric: She has a normal mood and affect. Her speech is normal and behavior is normal. Thought content normal.  Nursing note and vitals reviewed.    ED Treatments / Results  DIAGNOSTIC STUDIES: Oxygen Saturation is 100% on RA, normal by my interpretation.    COORDINATION OF CARE: 12:17 PM Discussed treatment plan with pt at bedside and pt agreed to plan.  Labs (all labs ordered are listed, but only  abnormal results are displayed) Labs Reviewed  URINALYSIS, ROUTINE W REFLEX MICROSCOPIC - Abnormal; Notable for the following:       Result Value   APPearance HAZY (*)    Hgb urine dipstick SMALL (*)    Nitrite POSITIVE (*)    Leukocytes, UA LARGE (*)    Bacteria, UA MANY (*)    Squamous Epithelial / LPF 0-5 (*)    All other components within normal limits  URINE CULTURE    EKG  EKG Interpretation None       Radiology No results found.  Procedures Procedures (including critical care time)  Medications Ordered in ED Medications  cephALEXin (KEFLEX) capsule 500 mg (not administered)     Initial Impression / Assessment and Plan / ED Course  I have reviewed the triage vital signs and the nursing notes.  Pertinent labs & imaging results that were available during my care of the patient were reviewed by me and considered in my medical decision making (see chart for details).  Final Clinical Impressions(s) / ED Diagnoses  {I have reviewed and evaluated the relevant laboratory values.   {I have reviewed the relevant previous healthcare records.  {I obtained HPI from historian.   ED Course:  Assessment: Pt is a 26yF malodous urine x 1 week. No dysuria. No hematuria. No flank pain. No vaginal bleeding/discharge. Pt has been diagnosed with a UTI based on UA. Culture sent. On exam, pt in NAD. VSS. Normotensive. Afebrile. Lungs CTA, Heart RRR, no CVA tenderness, and denies N/V. Pt to be dc home with antibiotics and instructions to follow up with PCP if symptoms persist  Disposition/Plan:  DC Home Additional Verbal discharge instructions given and discussed with patient.  Pt Instructed to f/u with PCP in the next week for evaluation and treatment of symptoms. Return precautions given Pt acknowledges and agrees with plan  Supervising Physician Jacalyn Lefevre, MD  Final diagnoses:  Urinary tract infection without hematuria, site unspecified    New Prescriptions New  Prescriptions   No medications on file   I personally performed the services described in this documentation, which was scribed in my presence. The recorded information has been reviewed and is accurate.    Adrienne Pili, PA-C 01/04/17 1238    Jacalyn Lefevre, MD 01/04/17 1328

## 2017-01-04 NOTE — Discharge Instructions (Signed)
Please read and follow all provided instructions.  Your diagnoses today include:  1. Urinary tract infection without hematuria, site unspecified     Tests performed today include: Urine test - suggests that you have an infection in your bladder Vital signs. See below for your results today.   Medications prescribed:  Take as prescribed  Home care instructions:  Follow any educational materials contained in this packet.  Follow-up instructions: Please follow-up with your primary care provider in 3 days if symptoms are not resolved for further evaluation of your symptoms.  Return instructions:  Please return to the Emergency Department if you experience worsening symptoms.  Return with fever, worsening pain, persistent vomiting, worsening pain in your back.  Please return if you have any other emergent concerns.  Additional Information:  Your vital signs today were: BP (!) 127/91 (BP Location: Right Arm)    Pulse 85    Temp 98.3 F (36.8 C) (Oral)    Resp 18    Ht 4\' 11"  (1.499 m)    Wt 72.6 kg (160 lb)    LMP 12/08/2016 (Within Days)    SpO2 100%    BMI 32.32 kg/m  If your blood pressure (BP) was elevated above 135/85 this visit, please have this repeated by your doctor within one month. --------------

## 2017-01-04 NOTE — ED Notes (Signed)
See providers assessment.  

## 2017-01-04 NOTE — ED Triage Notes (Signed)
Pt has been having pressure when urinating and thinks she has a UTI.

## 2017-01-08 LAB — URINE CULTURE

## 2017-01-09 ENCOUNTER — Telehealth: Payer: Self-pay

## 2017-01-09 NOTE — Telephone Encounter (Signed)
Post ED Visit - Positive Culture Follow-up  Culture report reviewed by antimicrobial stewardship pharmacist:  []  Enzo BiNathan Batchelder, Pharm.D. []  Celedonio MiyamotoJeremy Frens, Pharm.D., BCPS AQ-ID [x]  Garvin FilaMike Maccia, Pharm.D., BCPS []  Georgina PillionElizabeth Martin, Pharm.D., BCPS []  SanfordMinh Pham, VermontPharm.D., BCPS, AAHIVP []  Estella HuskMichelle Turner, Pharm.D., BCPS, AAHIVP []  Lysle Pearlachel Rumbarger, PharmD, BCPS []  Casilda Carlsaylor Stone, PharmD, BCPS []  Pollyann SamplesAndy Johnston, PharmD, BCPS  Positive urine culture Treated with Cephalexin, organism sensitive to the same and no further patient follow-up is required at this time.  Jerry CarasCullom, Markella Dao Burnett 01/09/2017, 11:42 AM

## 2017-03-18 ENCOUNTER — Encounter (HOSPITAL_COMMUNITY): Payer: Self-pay | Admitting: Emergency Medicine

## 2017-03-18 ENCOUNTER — Emergency Department (HOSPITAL_COMMUNITY)
Admission: EM | Admit: 2017-03-18 | Discharge: 2017-03-18 | Disposition: A | Discharge status: Home / Self Care | Payer: BLUE CROSS/BLUE SHIELD | Attending: Emergency Medicine | Admitting: Emergency Medicine

## 2017-03-18 DIAGNOSIS — J029 Acute pharyngitis, unspecified: Secondary | ICD-10-CM | POA: Insufficient documentation

## 2017-03-18 DIAGNOSIS — Z791 Long term (current) use of non-steroidal anti-inflammatories (NSAID): Secondary | ICD-10-CM | POA: Insufficient documentation

## 2017-03-18 LAB — RAPID STREP SCREEN (MED CTR MEBANE ONLY): Streptococcus, Group A Screen (Direct): NEGATIVE

## 2017-03-18 NOTE — ED Provider Notes (Signed)
[facility]-EMERGENCY DEPT Provider Note   CSN: [id number] Arrival date & time: 03/18/17  1758     History   Chief Complaint Chief Complaint  Patient presents with  . Sore Throat    HPI Adrienne Griffin is a 26 y.o. female.       The history is provided by the patient and medical records. No language interpreter was used.  Sore Throat  Pertinent negatives include no shortness of breath.   Adrienne Griffin is an otherwise healthy 26 y.o. female who presents to the Emergency Department complaining of persistent sore throat worse with swallowing x 2 days. No medications taken prior to arrival for symptoms. No fever, cough, congestion, difficulty breathing. Tolerating foods and fluids without difficulty. Denies sick contacts.  Past Medical History:  Diagnosis Date  . Medical history non-contributory     There are no active problems to display for this patient.   Past Surgical History:  Procedure Laterality Date  . wisdom tooth removal  2010    OB History    Gravida Para Term Preterm AB Living   1 1 1     1    SAB TAB Ectopic Multiple Live Births         0 1       Home Medications    Prior to Admission medications   Medication Sig Start Date End Date Taking? Authorizing Provider  ibuprofen (ADVIL,MOTRIN) 600 MG tablet Take 1 tablet (600 mg total) by mouth every 6 (six) hours. 12/17/15   Federico Flake, MD  Misc. Devices (BREAST PUMP) MISC Dispense one breast pump for patient 10/15/15   Allie Bossier, MD    Family History Family History  Problem Relation Age of Onset  . Hypertension Mother   . Diabetes Mother     Social History Social History  Substance Use Topics  . Smoking status: Never Smoker  . Smokeless tobacco: Never Used  . Alcohol use Yes     Comment: occasional pre-pregnancy     Allergies   Patient has no known allergies.   Review of Systems Review of Systems  Constitutional: Negative for fever.  HENT: Positive for sore throat.  Negative for congestion and voice change.   Respiratory: Negative for cough and shortness of breath.      Physical Exam Updated Vital Signs BP 117/64 (BP Location: Right Arm)   Pulse 90   Temp 98.2 F (36.8 C) (Oral)   Resp 16   Ht 4\' 11"  (1.499 m)   Wt 72.6 kg (160 lb)   SpO2 100%   BMI 32.32 kg/m   Physical Exam  Constitutional: She appears well-developed and well-nourished. No distress.  HENT:  Head: Normocephalic and atraumatic.  OP with erythema. No tonsillar hypertrophy or exudates.   Neck: Neck supple.  Cardiovascular: Normal rate, regular rhythm and normal heart sounds.   No murmur heard. Pulmonary/Chest: Effort normal and breath sounds normal. No respiratory distress. She has no wheezes. She has no rales.  Musculoskeletal: Normal range of motion.  Lymphadenopathy:    She has cervical adenopathy.  Neurological: She is alert.  Skin: Skin is warm and dry.  Nursing note and vitals reviewed.    ED Treatments / Results  Labs (all labs ordered are listed, but only abnormal results are displayed) Labs Reviewed  RAPID STREP SCREEN (NOT AT [facility])  CULTURE, GROUP A STREP [facility])    EKG  EKG Interpretation None       Radiology No results found.  Procedures  Procedures (including critical care time)  Medications Ordered in ED Medications - No data to display   Initial Impression / Assessment and Plan / ED Course  I have reviewed the triage vital signs and the nursing notes.  Pertinent labs & imaging results that were available during my care of the patient were reviewed by me and considered in my medical decision making (see chart for details).    Adrienne Griffin is a 26 y.o. female who presents to ED for sore throat x 2 days. Afebrile, hemodynamically stable with erythema to the OP but no exudates or tonsillar hypertrophy. Rapid strep negative. Tolerating secretions well. Symptomatic home care instructions discussed. PCP follow up if symptoms persist.  Reasons to return to ER discussed and all questions answered.    Final Clinical Impressions(s) / ED Diagnoses   Final diagnoses:  Viral pharyngitis    New Prescriptions Discharge Medication List as of 03/18/2017  7:00 PM       Moreen Piggott, Chase Picket, PA-C 03/18/17 1610    Arby Barrette, MD 03/24/17 [phone]

## 2017-03-18 NOTE — ED Triage Notes (Signed)
Onset 2 days ago sore throat and continued today states pain 2/10 sore when swallowing. Airway intact bilateral equal chest rise and fall.

## 2017-03-18 NOTE — Discharge Instructions (Signed)
Alternate between Tylenol and ibuprofen as needed for pain. Gargle warm salt water and spit it out. It is very important to stay hydrated!  Follow up with your primary care doctor in 5-7 days for recheck of ongoing symptoms and return to emergency department if any new or worsening of symptoms develop or you have any additional concerns.  °

## 2017-03-18 NOTE — ED Notes (Signed)
Declined W/C at D/C and was escorted to lobby by RN. 

## 2017-03-21 LAB — CULTURE, GROUP A STREP (THRC)

## 2017-04-23 ENCOUNTER — Encounter: Payer: Self-pay | Admitting: Family Medicine

## 2017-04-23 ENCOUNTER — Ambulatory Visit (INDEPENDENT_AMBULATORY_CARE_PROVIDER_SITE_OTHER): Payer: BLUE CROSS/BLUE SHIELD | Admitting: Family Medicine

## 2017-04-23 VITALS — BP 113/73 | HR 82 | Wt 181.0 lb

## 2017-04-23 DIAGNOSIS — Z3687 Encounter for antenatal screening for uncertain dates: Secondary | ICD-10-CM | POA: Diagnosis not present

## 2017-04-23 DIAGNOSIS — Z3480 Encounter for supervision of other normal pregnancy, unspecified trimester: Secondary | ICD-10-CM | POA: Insufficient documentation

## 2017-04-23 DIAGNOSIS — Z113 Encounter for screening for infections with a predominantly sexual mode of transmission: Secondary | ICD-10-CM

## 2017-04-23 DIAGNOSIS — Z3481 Encounter for supervision of other normal pregnancy, first trimester: Secondary | ICD-10-CM

## 2017-04-23 NOTE — Progress Notes (Signed)
   Subjective:    Adrienne Griffin is a G2P1001 [redacted]w[redacted]d being seen today for her first obstetrical visit.  Her obstetrical history is not significant. Pregnancy history fully reviewed.  Patient reports fatigue and nausea.  There were no vitals filed for this visit.  HISTORY: OB History  Gravida Para Term Preterm AB Living  SAB TAB Ectopic Multiple Live Births        0 1    # Outcome Date GA Lbr Len/2nd Weight Sex Delivery Anes PTL Lv  2 Current           1 Term 12/15/15 [redacted]w[redacted]d 18:45 / 06:49 7 lb 9.5 oz (3.445 kg) M Vag-Vacuum EPI  LIV     Past Medical History:  Diagnosis Date  . Medical history non-contributory    Past Surgical History:  Procedure Laterality Date  . wisdom tooth removal  2010   Family History  Problem Relation Age of Onset  . Hypertension Mother   . Diabetes Mother      Exam    Uterus:   8 wk size  Pelvic Exam:    Perineum: Normal Perineum   Vulva: Bartholin's, Urethra, Skene's normal   Vagina:  normal mucosa, normal discharge   Cervix: multiparous appearance   Adnexa: normal adnexa   Bony Pelvis: average  System: Breast:  normal appearance, no masses or tenderness   Skin: normal coloration and turgor, no rashes    Neurologic: normal mood   Extremities: normal strength, tone, and muscle mass, no deformities   HEENT extra ocular movement intact and sclera clear, anicteric   Mouth/Teeth mucous membranes moist, pharynx normal without lesions and dental hygiene good   Neck supple   Cardiovascular: regular rate and rhythm, no murmurs or gallops   Respiratory:  appears well, vitals normal, no respiratory distress, acyanotic, normal RR, ear and throat exam is normal, neck free of mass or lymphadenopathy, chest clear, no wheezing, crepitations, rhonchi, normal symmetric air entry   Abdomen: soft, non-tender; bowel sounds normal; no masses,  no organomegaly      Assessment/Plan:    Pregnancy: G2P1001 1. Supervision of other normal  pregnancy, antepartum [employer] patient New OB labs and first screen - Obstetric Panel, Including HIV - Cystic Fibrosis Mutation 97 - GC/Chlamydia probe amp ([place])not at [facility] - Urine Culture - [place] bedside; Future - SMN1 Copy Number Analysis - Flu Vaccine QUAD 36+ mos IM (Fluarix, Quad PF) - [place] MFM Fetal Nuchal Translucency; Future - Enroll Patient in [employer] - Babyscripts Schedule Optimization  Return in 4 weeks (on 05/21/2017).  Reva Bores 04/23/2017

## 2017-04-23 NOTE — Patient Instructions (Signed)
 First Trimester of Pregnancy The first trimester of pregnancy is from week 1 until the end of week 13 (months 1 through 3). A week after a sperm fertilizes an egg, the egg will implant on the wall of the uterus. This embryo will begin to develop into a baby. Genes from you and your partner will form the baby. The female genes will determine whether the baby will be a boy or a girl. At 6-8 weeks, the eyes and face will be formed, and the heartbeat can be seen on ultrasound. At the end of 12 weeks, all the baby's organs will be formed. Now that you are pregnant, you will want to do everything you can to have a healthy baby. Two of the most important things are to get good prenatal care and to follow your health care provider's instructions. Prenatal care is all the medical care you receive before the baby's birth. This care will help prevent, find, and treat any problems during the pregnancy and childbirth. Body changes during your first trimester Your body goes through many changes during pregnancy. The changes vary from woman to woman.  You may gain or lose a couple of pounds at first.  You may feel sick to your stomach (nauseous) and you may throw up (vomit). If the vomiting is uncontrollable, call your health care provider.  You may tire easily.  You may develop headaches that can be relieved by medicines. All medicines should be approved by your health care provider.  You may urinate more often. Painful urination may mean you have a bladder infection.  You may develop heartburn as a result of your pregnancy.  You may develop constipation because certain hormones are causing the muscles that push stool through your intestines to slow down.  You may develop hemorrhoids or swollen veins (varicose veins).  Your breasts may begin to grow larger and become tender. Your nipples may stick out more, and the tissue that surrounds them (areola) may become darker.  Your gums may bleed and may be  sensitive to brushing and flossing.  Dark spots or blotches (chloasma, mask of pregnancy) may develop on your face. This will likely fade after the baby is born.  Your menstrual periods will stop.  You may have a loss of appetite.  You may develop cravings for certain kinds of food.  You may have changes in your emotions from day to day, such as being excited to be pregnant or being concerned that something may go wrong with the pregnancy and baby.  You may have more vivid and strange dreams.  You may have changes in your hair. These can include thickening of your hair, rapid growth, and changes in texture. Some women also have hair loss during or after pregnancy, or hair that feels dry or thin. Your hair will most likely return to normal after your baby is born.  What to expect at prenatal visits During a routine prenatal visit:  You will be weighed to make sure you and the baby are growing normally.  Your blood pressure will be taken.  Your abdomen will be measured to track your baby's growth.  The fetal heartbeat will be listened to between weeks 10 and 14 of your pregnancy.  Test results from any previous visits will be discussed.  Your health care provider may ask you:  How you are feeling.  If you are feeling the baby move.  If you have had any abnormal symptoms, such as leaking fluid, bleeding, severe   headaches, or abdominal cramping.  If you are using any tobacco products, including cigarettes, chewing tobacco, and electronic cigarettes.  If you have any questions.  Other tests that may be performed during your first trimester include:  Blood tests to find your blood type and to check for the presence of any previous infections. The tests will also be used to check for low iron levels (anemia) and protein on red blood cells (Rh antibodies). Depending on your risk factors, or if you previously had diabetes during pregnancy, you may have tests to check for high blood  sugar that affects pregnant women (gestational diabetes).  Urine tests to check for infections, diabetes, or protein in the urine.  An ultrasound to confirm the proper growth and development of the baby.  Fetal screens for spinal cord problems (spina bifida) and Down syndrome.  HIV (human immunodeficiency virus) testing. Routine prenatal testing includes screening for HIV, unless you choose not to have this test.  You may need other tests to make sure you and the baby are doing well.  Follow these instructions at home: Medicines  Follow your health care provider's instructions regarding medicine use. Specific medicines may be either safe or unsafe to take during pregnancy.  Take a prenatal vitamin that contains at least 600 micrograms (mcg) of folic acid.  If you develop constipation, try taking a stool softener if your health care provider approves. Eating and drinking  Eat a balanced diet that includes fresh fruits and vegetables, whole grains, good sources of protein such as meat, eggs, or tofu, and low-fat dairy. Your health care provider will help you determine the amount of weight gain that is right for you.  Avoid raw meat and uncooked cheese. These carry germs that can cause birth defects in the baby.  Eating four or five small meals rather than three large meals a day may help relieve nausea and vomiting. If you start to feel nauseous, eating a few soda crackers can be helpful. Drinking liquids between meals, instead of during meals, also seems to help ease nausea and vomiting.  Limit foods that are high in fat and processed sugars, such as fried and sweet foods.  To prevent constipation: ? Eat foods that are high in fiber, such as fresh fruits and vegetables, whole grains, and beans. ? Drink enough fluid to keep your urine clear or pale yellow. Activity  Exercise only as directed by your health care provider. Most women can continue their usual exercise routine during  pregnancy. Try to exercise for 30 minutes at least 5 days a week. Exercising will help you: ? Control your weight. ? Stay in shape. ? Be prepared for labor and delivery.  Experiencing pain or cramping in the lower abdomen or lower back is a good sign that you should stop exercising. Check with your health care provider before continuing with normal exercises.  Try to avoid standing for long periods of time. Move your legs often if you must stand in one place for a long time.  Avoid heavy lifting.  Wear low-heeled shoes and practice good posture.  You may continue to have sex unless your health care provider tells you not to. Relieving pain and discomfort  Wear a good support bra to relieve breast tenderness.  Take warm sitz baths to soothe any pain or discomfort caused by hemorrhoids. Use hemorrhoid cream if your health care provider approves.  Rest with your legs elevated if you have leg cramps or low back pain.  If you   develop varicose veins in your legs, wear support hose. Elevate your feet for 15 minutes, 3-4 times a day. Limit salt in your diet. Prenatal care  Schedule your prenatal visits by the twelfth week of pregnancy. They are usually scheduled monthly at first, then more often in the last 2 months before delivery.  Write down your questions. Take them to your prenatal visits.  Keep all your prenatal visits as told by your health care provider. This is important. Safety  Wear your seat belt at all times when driving.  Make a list of emergency phone numbers, including numbers for family, friends, the hospital, and police and fire departments. General instructions  Ask your health care provider for a referral to a local prenatal education class. Begin classes no later than the beginning of month 6 of your pregnancy.  Ask for help if you have counseling or nutritional needs during pregnancy. Your health care provider can offer advice or refer you to specialists for help  with various needs.  Do not use hot tubs, steam rooms, or saunas.  Do not douche or use tampons or scented sanitary pads.  Do not cross your legs for long periods of time.  Avoid cat litter boxes and soil used by cats. These carry germs that can cause birth defects in the baby and possibly loss of the fetus by miscarriage or stillbirth.  Avoid all smoking, herbs, alcohol, and medicines not prescribed by your health care provider. Chemicals in these products affect the formation and growth of the baby.  Do not use any products that contain nicotine or tobacco, such as cigarettes and e-cigarettes. If you need help quitting, ask your health care provider. You may receive counseling support and other resources to help you quit.  Schedule a dentist appointment. At home, brush your teeth with a soft toothbrush and be gentle when you floss. Contact a health care provider if:  You have dizziness.  You have mild pelvic cramps, pelvic pressure, or nagging pain in the abdominal area.  You have persistent nausea, vomiting, or diarrhea.  You have a bad smelling vaginal discharge.  You have pain when you urinate.  You notice increased swelling in your face, hands, legs, or ankles.  You are exposed to fifth disease or chickenpox.  You are exposed to German measles (rubella) and have never had it. Get help right away if:  You have a fever.  You are leaking fluid from your vagina.  You have spotting or bleeding from your vagina.  You have severe abdominal cramping or pain.  You have rapid weight gain or loss.  You vomit blood or material that looks like coffee grounds.  You develop a severe headache.  You have shortness of breath.  You have any kind of trauma, such as from a fall or a car accident. Summary  The first trimester of pregnancy is from week 1 until the end of week 13 (months 1 through 3).  Your body goes through many changes during pregnancy. The changes vary from  woman to woman.  You will have routine prenatal visits. During those visits, your health care provider will examine you, discuss any test results you may have, and talk with you about how you are feeling. This information is not intended to replace advice given to you by your health care provider. Make sure you discuss any questions you have with your health care provider. Document Released: 07/07/2001 Document Revised: 06/24/2016 Document Reviewed: 06/24/2016 [employer] Interactive Patient Education  2017   [employer]   Breastfeeding Deciding to breastfeed is one of the best choices you can make for you and your baby. A change in hormones during pregnancy causes your breast tissue to grow and increases the number and size of your milk ducts. These hormones also allow proteins, sugars, and fats from your blood supply to make breast milk in your milk-producing glands. Hormones prevent breast milk from being released before your baby is born as well as prompt milk flow after birth. Once breastfeeding has begun, thoughts of your baby, as well as his or her sucking or crying, can stimulate the release of milk from your milk-producing glands. Benefits of breastfeeding For Your Baby  Your first milk (colostrum) helps your baby's digestive system function better.  There are antibodies in your milk that help your baby fight off infections.  Your baby has a lower incidence of asthma, allergies, and sudden infant death syndrome.  The nutrients in breast milk are better for your baby than infant formulas and are designed uniquely for your baby's needs.  Breast milk improves your baby's brain development.  Your baby is less likely to develop other conditions, such as childhood obesity, asthma, or type 2 diabetes mellitus.  For You  Breastfeeding helps to create a very special bond between you and your baby.  Breastfeeding is convenient. Breast milk is always available at the correct temperature and  costs nothing.  Breastfeeding helps to burn calories and helps you lose the weight gained during pregnancy.  Breastfeeding makes your uterus contract to its prepregnancy size faster and slows bleeding (lochia) after you give birth.  Breastfeeding helps to lower your risk of developing type 2 diabetes mellitus, osteoporosis, and breast or ovarian cancer later in life.  Signs that your baby is hungry Early Signs of Hunger  Increased alertness or activity.  Stretching.  Movement of the head from side to side.  Movement of the head and opening of the mouth when the corner of the mouth or cheek is stroked (rooting).  Increased sucking sounds, smacking lips, cooing, sighing, or squeaking.  Hand-to-mouth movements.  Increased sucking of fingers or hands.  Late Signs of Hunger  Fussing.  Intermittent crying.  Extreme Signs of Hunger Signs of extreme hunger will require calming and consoling before your baby will be able to breastfeed successfully. Do not wait for the following signs of extreme hunger to occur before you initiate breastfeeding:  Restlessness.  A loud, strong cry.  Screaming.  Breastfeeding basics Breastfeeding Initiation  Find a comfortable place to sit or lie down, with your neck and back well supported.  Place a pillow or rolled up blanket under your baby to bring him or her to the level of your breast (if you are seated). Nursing pillows are specially designed to help support your arms and your baby while you breastfeed.  Make sure that your baby's abdomen is facing your abdomen.  Gently massage your breast. With your fingertips, massage from your chest wall toward your nipple in a circular motion. This encourages milk flow. You may need to continue this action during the feeding if your milk flows slowly.  Support your breast with 4 fingers underneath and your thumb above your nipple. Make sure your fingers are well away from your nipple and your baby's  mouth.  Stroke your baby's lips gently with your finger or nipple.  When your baby's mouth is open wide enough, quickly bring your baby to your breast, placing your entire nipple and as   much of the colored area around your nipple (areola) as possible into your baby's mouth. ? More areola should be visible above your baby's upper lip than below the lower lip. ? Your baby's tongue should be between his or her lower gum and your breast.  Ensure that your baby's mouth is correctly positioned around your nipple (latched). Your baby's lips should create a seal on your breast and be turned out (everted).  It is common for your baby to suck about 2-3 minutes in order to start the flow of breast milk.  Latching Teaching your baby how to latch on to your breast properly is very important. An improper latch can cause nipple pain and decreased milk supply for you and poor weight gain in your baby. Also, if your baby is not latched onto your nipple properly, he or she may swallow some air during feeding. This can make your baby fussy. Burping your baby when you switch breasts during the feeding can help to get rid of the air. However, teaching your baby to latch on properly is still the best way to prevent fussiness from swallowing air while breastfeeding. Signs that your baby has successfully latched on to your nipple:  Silent tugging or silent sucking, without causing you pain.  Swallowing heard between every 3-4 sucks.  Muscle movement above and in front of his or her ears while sucking.  Signs that your baby has not successfully latched on to nipple:  Sucking sounds or smacking sounds from your baby while breastfeeding.  Nipple pain.  If you think your baby has not latched on correctly, slip your finger into the corner of your baby's mouth to break the suction and place it between your baby's gums. Attempt breastfeeding initiation again. Signs of Successful Breastfeeding Signs from your  baby:  A gradual decrease in the number of sucks or complete cessation of sucking.  Falling asleep.  Relaxation of his or her body.  Retention of a small amount of milk in his or her mouth.  Letting go of your breast by himself or herself.  Signs from you:  Breasts that have increased in firmness, weight, and size 1-3 hours after feeding.  Breasts that are softer immediately after breastfeeding.  Increased milk volume, as well as a change in milk consistency and color by the fifth day of breastfeeding.  Nipples that are not sore, cracked, or bleeding.  Signs That Your Baby is Getting Enough Milk  Wetting at least 1-2 diapers during the first 24 hours after birth.  Wetting at least 5-6 diapers every 24 hours for the first week after birth. The urine should be clear or pale yellow by 5 days after birth.  Wetting 6-8 diapers every 24 hours as your baby continues to grow and develop.  At least 3 stools in a 24-hour period by age 5 days. The stool should be soft and yellow.  At least 3 stools in a 24-hour period by age 7 days. The stool should be seedy and yellow.  No loss of weight greater than 10% of birth weight during the first 3 days of age.  Average weight gain of 4-7 ounces (113-198 g) per week after age 4 days.  Consistent daily weight gain by age 5 days, without weight loss after the age of 2 weeks.  After a feeding, your baby may spit up a small amount. This is common. Breastfeeding frequency and duration Frequent feeding will help you make more milk and can prevent sore nipples and   breast engorgement. Breastfeed when you feel the need to reduce the fullness of your breasts or when your baby shows signs of hunger. This is called "breastfeeding on demand." Avoid introducing a pacifier to your baby while you are working to establish breastfeeding (the first 4-6 weeks after your baby is born). After this time you may choose to use a pacifier. Research has shown that  pacifier use during the first year of a baby's life decreases the risk of sudden infant death syndrome (SIDS). Allow your baby to feed on each breast as long as he or she wants. Breastfeed until your baby is finished feeding. When your baby unlatches or falls asleep while feeding from the first breast, offer the second breast. Because newborns are often sleepy in the first few weeks of life, you may need to awaken your baby to get him or her to feed. Breastfeeding times will vary from baby to baby. However, the following rules can serve as a guide to help you ensure that your baby is properly fed:  Newborns (babies 4 weeks of age or younger) may breastfeed every 1-3 hours.  Newborns should not go longer than 3 hours during the day or 5 hours during the night without breastfeeding.  You should breastfeed your baby a minimum of 8 times in a 24-hour period until you begin to introduce solid foods to your baby at around 6 months of age.  Breast milk pumping Pumping and storing breast milk allows you to ensure that your baby is exclusively fed your breast milk, even at times when you are unable to breastfeed. This is especially important if you are going back to work while you are still breastfeeding or when you are not able to be present during feedings. Your lactation consultant can give you guidelines on how long it is safe to store breast milk. A breast pump is a machine that allows you to pump milk from your breast into a sterile bottle. The pumped breast milk can then be stored in a refrigerator or freezer. Some breast pumps are operated by hand, while others use electricity. Ask your lactation consultant which type will work best for you. Breast pumps can be purchased, but some hospitals and breastfeeding support groups lease breast pumps on a monthly basis. A lactation consultant can teach you how to hand express breast milk, if you prefer not to use a pump. Caring for your breasts while you  breastfeed Nipples can become dry, cracked, and sore while breastfeeding. The following recommendations can help keep your breasts moisturized and healthy:  Avoid using soap on your nipples.  Wear a supportive bra. Although not required, special nursing bras and tank tops are designed to allow access to your breasts for breastfeeding without taking off your entire bra or top. Avoid wearing underwire-style bras or extremely tight bras.  Air dry your nipples for 3-4minutes after each feeding.  Use only cotton bra pads to absorb leaked breast milk. Leaking of breast milk between feedings is normal.  Use lanolin on your nipples after breastfeeding. Lanolin helps to maintain your skin's normal moisture barrier. If you use pure lanolin, you do not need to wash it off before feeding your baby again. Pure lanolin is not toxic to your baby. You may also hand express a few drops of breast milk and gently massage that milk into your nipples and allow the milk to air dry.  In the first few weeks after giving birth, some women experience extremely full breasts (engorgement).   Engorgement can make your breasts feel heavy, warm, and tender to the touch. Engorgement peaks within 3-5 days after you give birth. The following recommendations can help ease engorgement:  Completely empty your breasts while breastfeeding or pumping. You may want to start by applying warm, moist heat (in the shower or with warm water-soaked hand towels) just before feeding or pumping. This increases circulation and helps the milk flow. If your baby does not completely empty your breasts while breastfeeding, pump any extra milk after he or she is finished.  Wear a snug bra (nursing or regular) or tank top for 1-2 days to signal your body to slightly decrease milk production.  Apply ice packs to your breasts, unless this is too uncomfortable for you.  Make sure that your baby is latched on and positioned properly while  breastfeeding.  If engorgement persists after 48 hours of following these recommendations, contact your health care provider or a lactation consultant. Overall health care recommendations while breastfeeding  Eat healthy foods. Alternate between meals and snacks, eating 3 of each per day. Because what you eat affects your breast milk, some of the foods may make your baby more irritable than usual. Avoid eating these foods if you are sure that they are negatively affecting your baby.  Drink milk, fruit juice, and water to satisfy your thirst (about 10 glasses a day).  Rest often, relax, and continue to take your prenatal vitamins to prevent fatigue, stress, and anemia.  Continue breast self-awareness checks.  Avoid chewing and smoking tobacco. Chemicals from cigarettes that pass into breast milk and exposure to secondhand smoke may harm your baby.  Avoid alcohol and drug use, including marijuana. Some medicines that may be harmful to your baby can pass through breast milk. It is important to ask your health care provider before taking any medicine, including all over-the-counter and prescription medicine as well as vitamin and herbal supplements. It is possible to become pregnant while breastfeeding. If birth control is desired, ask your health care provider about options that will be safe for your baby. Contact a health care provider if:  You feel like you want to stop breastfeeding or have become frustrated with breastfeeding.  You have painful breasts or nipples.  Your nipples are cracked or bleeding.  Your breasts are red, tender, or warm.  You have a swollen area on either breast.  You have a fever or chills.  You have nausea or vomiting.  You have drainage other than breast milk from your nipples.  Your breasts do not become full before feedings by the fifth day after you give birth.  You feel sad and depressed.  Your baby is too sleepy to eat well.  Your baby is having  trouble sleeping.  Your baby is wetting less than 3 diapers in a 24-hour period.  Your baby has less than 3 stools in a 24-hour period.  Your baby's skin or the white part of his or her eyes becomes yellow.  Your baby is not gaining weight by 5 days of age. Get help right away if:  Your baby is overly tired (lethargic) and does not want to wake up and feed.  Your baby develops an unexplained fever. This information is not intended to replace advice given to you by your health care provider. Make sure you discuss any questions you have with your health care provider. Document Released: 07/13/2005 Document Revised: 12/25/2015 Document Reviewed: 01/04/2013 [employer] Interactive Patient Education  2017 [employer]  

## 2017-04-23 NOTE — Progress Notes (Signed)
DATING AND VIABILITY SONOGRAM   Adrienne Griffin is a 26 y.o. year old G2P1001 with LMP Patient's last menstrual period was 03/01/2017. which would correlate to  [redacted]w[redacted]d weeks gestation.  She has regular menstrual cycles.   She is here today for a confirmatory initial sonogram.    GESTATION: [place]     FETAL ACTIVITY:          Heart rate         170bpm          The fetus is active.   GESTATIONAL AGE AND  BIOMETRICS:  Gestational criteria: Estimated Date of Delivery: 12/06/17 by LMP now at [redacted]w[redacted]d  Previous Scans:0  CROWN RUMP LENGTH           1.18cm         [redacted]w[redacted]d weeks    AVERAGE EGA(BY THIS SCAN):  7wd2 weeks  WORKING EDD( LMP ):  5/13/219     TECHNICIAN COMMENTS:  SLIUP measuring [redacted]w[redacted]d with FHR 170bpm   A copy of this report including all images has been saved and backed up to a second source for retrieval if needed. All measures and details of the anatomical scan, placentation, fluid volume and pelvic anatomy are contained in that report.  Emmary Culbreath 04/23/2017 10:16 AM

## 2017-04-26 DIAGNOSIS — Z23 Encounter for immunization: Secondary | ICD-10-CM

## 2017-04-27 LAB — GC/CHLAMYDIA PROBE AMP (~~LOC~~) NOT AT ARMC
CHLAMYDIA, DNA PROBE: NEGATIVE
Neisseria Gonorrhea: NEGATIVE

## 2017-04-29 LAB — OBSTETRIC PANEL, INCLUDING HIV
ANTIBODY SCREEN: NEGATIVE
BASOS: 0 %
Basophils Absolute: 0 10*3/uL (ref 0.0–0.2)
EOS (ABSOLUTE): 0 10*3/uL (ref 0.0–0.4)
Eos: 1 %
HEMATOCRIT: 34.5 % (ref 34.0–46.6)
HIV SCREEN 4TH GENERATION: NONREACTIVE
Hemoglobin: 11.5 g/dL (ref 11.1–15.9)
Hepatitis B Surface Ag: NEGATIVE
Immature Grans (Abs): 0 10*3/uL (ref 0.0–0.1)
Immature Granulocytes: 0 %
Lymphocytes Absolute: 2.1 10*3/uL (ref 0.7–3.1)
Lymphs: 39 %
MCH: 28.9 pg (ref 26.6–33.0)
MCHC: 33.3 g/dL (ref 31.5–35.7)
MCV: 87 fL (ref 79–97)
MONOCYTES: 6 %
MONOS ABS: 0.3 10*3/uL (ref 0.1–0.9)
NEUTROS ABS: 2.9 10*3/uL (ref 1.4–7.0)
Neutrophils: 54 %
Platelets: 320 10*3/uL (ref 150–379)
RBC: 3.98 x10E6/uL (ref 3.77–5.28)
RDW: 13.1 % (ref 12.3–15.4)
RPR Ser Ql: NONREACTIVE
RUBELLA: 1.43 {index} (ref >0.99)
Rh Factor: POSITIVE
WBC: 5.4 10*3/uL (ref 3.4–10.8)

## 2017-04-29 LAB — SMN1 COPY NUMBER ANALYSIS (SMA CARRIER SCREENING)

## 2017-04-29 LAB — CYSTIC FIBROSIS MUTATION 97: GENE DIS ANAL CARRIER INTERP BLD/T-IMP: NOT DETECTED

## 2017-05-21 ENCOUNTER — Ambulatory Visit (INDEPENDENT_AMBULATORY_CARE_PROVIDER_SITE_OTHER): Payer: BLUE CROSS/BLUE SHIELD | Admitting: Obstetrics & Gynecology

## 2017-05-21 VITALS — BP 107/76 | HR 93 | Wt 180.2 lb

## 2017-05-21 DIAGNOSIS — Z3480 Encounter for supervision of other normal pregnancy, unspecified trimester: Secondary | ICD-10-CM

## 2017-05-21 DIAGNOSIS — O99210 Obesity complicating pregnancy, unspecified trimester: Secondary | ICD-10-CM

## 2017-05-21 DIAGNOSIS — E669 Obesity, unspecified: Secondary | ICD-10-CM

## 2017-05-21 DIAGNOSIS — Z3481 Encounter for supervision of other normal pregnancy, first trimester: Secondary | ICD-10-CM

## 2017-05-21 DIAGNOSIS — O99211 Obesity complicating pregnancy, first trimester: Secondary | ICD-10-CM

## 2017-05-21 NOTE — Progress Notes (Signed)
   PRENATAL VISIT NOTE  Subjective:  Adrienne Griffin is a 26 y.o. G2P1001 at 5374w4d being seen today for ongoing prenatal care.  She is currently monitored for the following issues for this low-risk pregnancy and has Supervision of other normal pregnancy, antepartum on her problem list.  Patient reports no complaints.  Contractions: Not present. Vag. Bleeding: None.  Movement: Absent. Denies leaking of fluid.   The following portions of the patient's history were reviewed and updated as appropriate: allergies, current medications, past family history, past medical history, past social history, past surgical history and problem list. Problem list updated.  Objective:   Vitals:   05/21/17 1050  BP: 107/76  Pulse: 93  Weight: 180 lb 3.2 oz (81.7 kg)    Fetal Status:     Movement: Absent     General:  Alert, oriented and cooperative. Patient is in no acute distress.  Skin: Skin is warm and dry. No rash noted.   Cardiovascular: Normal heart rate noted  Respiratory: Normal respiratory effort, no problems with respiration noted  Abdomen: Soft, gravid, appropriate for gestational age.  Pain/Pressure: Absent     Pelvic: Cervical exam deferred        Extremities: Normal range of motion.  Edema: None  Mental Status:  Normal mood and affect. Normal behavior. Normal judgment and thought content.   Assessment and Plan:  Pregnancy: G2P1001 at 1274w4d  1. Supervision of other normal pregnancy, antepartum  - Flu Vaccine QUAD 36+ mos IM (Fluarix, Quad PF) - US MFM OB COMP + 14 WK; Future  Preterm labor symptoms and general obstetric precautions including but not limited to vaginal bleeding, contractions, leaking of fluid and fetal movement were reviewed in detail with the patient. Please refer to After Visit Summary for other counseling recommendations.  No Follow-up on file.   Allie BossierMyra C Lisa Milian, MD

## 2017-05-22 LAB — HEMOGLOBIN A1C
ESTIMATED AVERAGE GLUCOSE: 103 mg/dL
HEMOGLOBIN A1C: 5.2 % (ref 4.8–5.6)

## 2017-05-25 ENCOUNTER — Encounter (HOSPITAL_COMMUNITY): Payer: Self-pay | Admitting: Family Medicine

## 2017-06-03 ENCOUNTER — Encounter (HOSPITAL_COMMUNITY): Payer: Self-pay

## 2017-06-03 ENCOUNTER — Ambulatory Visit (HOSPITAL_COMMUNITY)
Admission: RE | Admit: 2017-06-03 | Discharge: 2017-06-03 | Disposition: A | Discharge status: Home / Self Care | Payer: BLUE CROSS/BLUE SHIELD | Source: Ambulatory Visit | Attending: Family Medicine | Admitting: Family Medicine

## 2017-06-03 DIAGNOSIS — Z3480 Encounter for supervision of other normal pregnancy, unspecified trimester: Secondary | ICD-10-CM | POA: Diagnosis present

## 2017-06-03 DIAGNOSIS — Z3A13 13 weeks gestation of pregnancy: Secondary | ICD-10-CM | POA: Diagnosis not present

## 2017-06-15 ENCOUNTER — Other Ambulatory Visit (HOSPITAL_COMMUNITY): Payer: Self-pay

## 2017-07-12 ENCOUNTER — Ambulatory Visit (HOSPITAL_COMMUNITY)
Admission: RE | Admit: 2017-07-12 | Discharge: 2017-07-12 | Disposition: A | Discharge status: Home / Self Care | Payer: BLUE CROSS/BLUE SHIELD | Source: Ambulatory Visit | Attending: Obstetrics & Gynecology | Admitting: Obstetrics & Gynecology

## 2017-07-12 ENCOUNTER — Other Ambulatory Visit: Payer: Self-pay | Admitting: Obstetrics & Gynecology

## 2017-07-12 DIAGNOSIS — O99212 Obesity complicating pregnancy, second trimester: Secondary | ICD-10-CM | POA: Diagnosis not present

## 2017-07-12 DIAGNOSIS — Z3480 Encounter for supervision of other normal pregnancy, unspecified trimester: Secondary | ICD-10-CM

## 2017-07-12 DIAGNOSIS — Z363 Encounter for antenatal screening for malformations: Secondary | ICD-10-CM | POA: Diagnosis present

## 2017-07-12 DIAGNOSIS — Z3A19 19 weeks gestation of pregnancy: Secondary | ICD-10-CM | POA: Insufficient documentation

## 2017-07-16 ENCOUNTER — Ambulatory Visit (INDEPENDENT_AMBULATORY_CARE_PROVIDER_SITE_OTHER): Payer: BLUE CROSS/BLUE SHIELD | Admitting: Obstetrics & Gynecology

## 2017-07-16 VITALS — BP 108/72 | HR 72 | Wt 183.1 lb

## 2017-07-16 DIAGNOSIS — Z3480 Encounter for supervision of other normal pregnancy, unspecified trimester: Secondary | ICD-10-CM

## 2017-07-16 NOTE — Progress Notes (Signed)
   PRENATAL VISIT NOTE  Subjective:  Adrienne Griffin is a 26 y.o. G2P1001 at 5818w4d being seen today for ongoing prenatal care.  She is currently monitored for the following issues for this low-risk pregnancy and has Supervision of other normal pregnancy, antepartum on their problem list.  Patient reports no complaints.   .  .   . Denies leaking of fluid.   The following portions of the patient's history were reviewed and updated as appropriate: allergies, current medications, past family history, past medical history, past social history, past surgical history and problem list. Problem list updated.  Objective:  There were no vitals filed for this visit.  Fetal Status:           General:  Alert, oriented and cooperative. Patient is in no acute distress.  Skin: Skin is warm and dry. No rash noted.   Cardiovascular: Normal heart rate noted  Respiratory: Normal respiratory effort, no problems with respiration noted  Abdomen: Soft, gravid, appropriate for gestational age.        Pelvic: Cervical exam deferred        Extremities: Normal range of motion.     Mental Status:  Normal mood and affect. Normal behavior. Normal judgment and thought content.   Assessment and Plan:  Pregnancy: G2P1001 at 6818w4d  1. Supervision of other normal pregnancy, antepartum  - US MFM OB LIMITED; Future  Preterm labor symptoms and general obstetric precautions including but not limited to vaginal bleeding, contractions, leaking of fluid and fetal movement were reviewed in detail with the patient. Please refer to After Visit Summary for other counseling recommendations.  No Follow-up on file.   Allie BossierMyra C Bronson Bressman, MD

## 2017-07-16 NOTE — Progress Notes (Signed)
   PRENATAL VISIT NOTE  Subjective:  Adrienne Griffin is a 26 y.o. G2P1001 at 2962w4d being seen today for ongoing prenatal care.  She is currently monitored for the following issues for this low-risk pregnancy and has Supervision of other normal pregnancy, antepartum on their problem list.  Patient reports no complaints.   .  .  Movement: Present. Denies leaking of fluid.   The following portions of the patient's history were reviewed and updated as appropriate: allergies, current medications, past family history, past medical history, past social history, past surgical history and problem list. Problem list updated.  Objective:   Vitals:   07/16/17 0945  BP: 108/72  Pulse: 72  Weight: 183 lb 1.6 oz (83.1 kg)    Fetal Status: Fetal Heart Rate (bpm): 150   Movement: Present     General:  Alert, oriented and cooperative. Patient is in no acute distress.  Skin: Skin is warm and dry. No rash noted.   Cardiovascular: Normal heart rate noted  Respiratory: Normal respiratory effort, no problems with respiration noted  Abdomen: Soft, gravid, appropriate for gestational age.  Pain/Pressure: Absent     Pelvic: Cervical exam deferred        Extremities: Normal range of motion.  Edema: None  Mental Status:  Normal mood and affect. Normal behavior. Normal judgment and thought content.   Assessment and Plan:  Pregnancy: G2P1001 at 7862w4d  1. Supervision of other normal pregnancy, antepartum  - US MFM OB LIMITED; Future  Preterm labor symptoms and general obstetric precautions including but not limited to vaginal bleeding, contractions, leaking of fluid and fetal movement were reviewed in detail with the patient. Please refer to After Visit Summary for other counseling recommendations.  No Follow-up on file.   Allie BossierMyra C Ednah Hammock, MD

## 2017-07-23 ENCOUNTER — Inpatient Hospital Stay (HOSPITAL_COMMUNITY)
Admission: AD | Admit: 2017-07-23 | Discharge: 2017-07-23 | Disposition: A | Discharge status: Home / Self Care | Payer: No Typology Code available for payment source | Source: Ambulatory Visit | Attending: Obstetrics & Gynecology | Admitting: Obstetrics & Gynecology

## 2017-07-23 ENCOUNTER — Encounter (HOSPITAL_COMMUNITY): Payer: Self-pay | Admitting: *Deleted

## 2017-07-23 ENCOUNTER — Other Ambulatory Visit: Payer: Self-pay

## 2017-07-23 ENCOUNTER — Inpatient Hospital Stay (HOSPITAL_COMMUNITY)
Admission: AD | Admit: 2017-07-23 | Discharge: 2017-07-23 | Disposition: A | Discharge status: Home / Self Care | Payer: BLUE CROSS/BLUE SHIELD | Source: Ambulatory Visit | Attending: Obstetrics & Gynecology | Admitting: Obstetrics & Gynecology

## 2017-07-23 ENCOUNTER — Emergency Department (HOSPITAL_COMMUNITY)
Admission: EM | Admit: 2017-07-23 | Discharge: 2017-07-23 | Discharge status: Against Medical Advice | Payer: BLUE CROSS/BLUE SHIELD

## 2017-07-23 DIAGNOSIS — T1490XA Injury, unspecified, initial encounter: Secondary | ICD-10-CM

## 2017-07-23 DIAGNOSIS — Z3492 Encounter for supervision of normal pregnancy, unspecified, second trimester: Secondary | ICD-10-CM | POA: Diagnosis not present

## 2017-07-23 DIAGNOSIS — Z833 Family history of diabetes mellitus: Secondary | ICD-10-CM | POA: Diagnosis not present

## 2017-07-23 DIAGNOSIS — O9A212 Injury, poisoning and certain other consequences of external causes complicating pregnancy, second trimester: Secondary | ICD-10-CM

## 2017-07-23 DIAGNOSIS — Z3A20 20 weeks gestation of pregnancy: Secondary | ICD-10-CM

## 2017-07-23 DIAGNOSIS — Z79899 Other long term (current) drug therapy: Secondary | ICD-10-CM | POA: Insufficient documentation

## 2017-07-23 DIAGNOSIS — Z3480 Encounter for supervision of other normal pregnancy, unspecified trimester: Secondary | ICD-10-CM

## 2017-07-23 DIAGNOSIS — Z8249 Family history of ischemic heart disease and other diseases of the circulatory system: Secondary | ICD-10-CM | POA: Diagnosis not present

## 2017-07-23 NOTE — MAU Note (Signed)
Patient informed staff that she desired to leave. States she can no longer wait. Carloyn Jaeger. Dawson, CNM informed and RN obtained AMA form. Patient left prior to triage and being evaluated.

## 2017-07-23 NOTE — MAU Note (Signed)
Urine sent to lab 
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
## 2017-07-23 NOTE — ED Notes (Signed)
Pt in Peds waiting area with child
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
## 2017-07-23 NOTE — ED Notes (Signed)
Pt states that she is going to [facility]

## 2017-07-26 ENCOUNTER — Ambulatory Visit (HOSPITAL_COMMUNITY): Payer: BLUE CROSS/BLUE SHIELD

## 2017-07-27 NOTE — L&D Delivery Note (Signed)
Delivery Note At 8:31 PM a viable and healthy female was delivered via Vaginal, Spontaneous (Presentation: ROA  ).  APGAR: 8, 9; weight pending .  Placenta status:spontaneous ,intact .  Cord: 3 vessel with the following complications: none.    Anesthesia:  epidural Episiotomy: None Lacerations: None Suture Repair: n/a Est. Blood Loss (mL): 100  Mom to postpartum.  Baby to Couplet care / Skin to Skin.  Adrienne Griffin is a 27 y.o. female G2P1001 with IUP at [redacted]w[redacted]d admitted for active labor at term .  She progressed without augmentation to complete and pushed less than 1 hour to deliver.  Cord clamping delayed by 1-3 minutes then clamped by CNM and cut by FOB.  Placenta intact and spontaneous, bleeding minimal.  Intact perineum.  Mom and baby stable prior to transfer to postpartum. She plans on breastfeeding.    Sharen Counter 12/03/2017, 8:42 PM

## 2017-08-13 ENCOUNTER — Ambulatory Visit (INDEPENDENT_AMBULATORY_CARE_PROVIDER_SITE_OTHER): Payer: BLUE CROSS/BLUE SHIELD | Admitting: Obstetrics & Gynecology

## 2017-08-13 VITALS — BP 104/73 | HR 93 | Wt 186.4 lb

## 2017-08-13 DIAGNOSIS — IMO0002 Reserved for concepts with insufficient information to code with codable children: Secondary | ICD-10-CM

## 2017-08-13 DIAGNOSIS — Z0489 Encounter for examination and observation for other specified reasons: Secondary | ICD-10-CM

## 2017-08-13 DIAGNOSIS — Z3480 Encounter for supervision of other normal pregnancy, unspecified trimester: Secondary | ICD-10-CM

## 2017-08-13 DIAGNOSIS — R12 Heartburn: Secondary | ICD-10-CM

## 2017-08-13 DIAGNOSIS — O26892 Other specified pregnancy related conditions, second trimester: Secondary | ICD-10-CM

## 2017-08-13 NOTE — Progress Notes (Signed)
   PRENATAL VISIT NOTE  Subjective:  Adrienne Griffin is a 27 y.o. G2P1001 at 7410w4d being seen today for ongoing prenatal care.  She is currently monitored for the following issues for this low-risk pregnancy and has Supervision of other normal pregnancy, antepartum on their problem list.  Patient reports no complaints.   .  .  Movement: Present. Denies leaking of fluid.   The following portions of the patient's history were reviewed and updated as appropriate: allergies, current medications, past family history, past medical history, past social history, past surgical history and problem list. Problem list updated.  Objective:   Vitals:   08/13/17 1046  BP: 104/73  Pulse: 93  Weight: 186 lb 6.4 oz (84.6 kg)    Fetal Status: Fetal Heart Rate (bpm): 145 Fundal Height: 23 cm Movement: Present     General:  Alert, oriented and cooperative. Patient is in no acute distress.  Skin: Skin is warm and dry. No rash noted.   Cardiovascular: Normal heart rate noted  Respiratory: Normal respiratory effort, no problems with respiration noted  Abdomen: Soft, gravid, appropriate for gestational age.  Pain/Pressure: Absent     Pelvic: Cervical exam deferred        Extremities: Normal range of motion.  Edema: None  Mental Status:  Normal mood and affect. Normal behavior. Normal judgment and thought content.    Assessment and Plan:  Pregnancy: G2P1001 at 7310w4d  1. Heartburn during pregnancy in second trimester Pepcid vs Zantac recommended  2. Evaluate anatomy not seen on prior sonogram Inadequate anatomy scan.  - US MFM OB FOLLOW UP; Future  3. Supervision of other normal pregnancy, antepartum Preterm labor symptoms and general obstetric precautions including but not limited to vaginal bleeding, contractions, leaking of fluid and fetal movement were reviewed in detail with the patient. Please refer to After Visit Summary for other counseling recommendations.  Return in about 4 weeks  (around 09/10/2017) for 2 hr GTT, 3rd trimester labs, TDap, OB Visit.   Jaynie CollinsUgonna Aden Sek, MD

## 2017-08-13 NOTE — Patient Instructions (Addendum)
Return to clinic for any scheduled appointments or obstetric concerns, or go to [facility] for evaluation  Tdap Vaccine (Tetanus, Diphtheria and Pertussis): What You Need to Know 1. Why get vaccinated? Tetanus, diphtheria and pertussis are very serious diseases. Tdap vaccine can protect us from these diseases. And, Tdap vaccine given to pregnant women can protect newborn babies against pertussis. TETANUS (Lockjaw) is rare in the [place] today. It causes painful muscle tightening and stiffness, usually all over the body.  It can lead to tightening of muscles in the head and neck so you can't open your mouth, swallow, or sometimes even breathe. Tetanus kills about 1 out of 10 people who are infected even after receiving the best medical care. DIPHTHERIA is also rare in the [place] today. It can cause a thick coating to form in the back of the throat.  It can lead to breathing problems, heart failure, paralysis, and death. PERTUSSIS (Whooping Cough) causes severe coughing spells, which can cause difficulty breathing, vomiting and disturbed sleep.  It can also lead to weight loss, incontinence, and rib fractures. Up to 2 in 100 adolescents and 5 in 100 adults with pertussis are hospitalized or have complications, which could include pneumonia or death. These diseases are caused by bacteria. Diphtheria and pertussis are spread from person to person through secretions from coughing or sneezing. Tetanus enters the body through cuts, scratches, or wounds. Before vaccines, as many as 200,000 cases of diphtheria, 200,000 cases of pertussis, and hundreds of cases of tetanus, were reported in the [place] each year. Since vaccination began, reports of cases for tetanus and diphtheria have dropped by about 99% and for pertussis by about 80%. 2. Tdap vaccine Tdap vaccine can protect adolescents and adults from tetanus, diphtheria, and pertussis. One dose of Tdap is routinely given at age 11 or 12.  People who did not get Tdap at that age should get it as soon as possible. Tdap is especially important for healthcare professionals and anyone having close contact with a baby younger than 12 months. Pregnant women should get a dose of Tdap during every pregnancy, to protect the newborn from pertussis. Infants are most at risk for severe, life-threatening complications from pertussis. Another vaccine, called Td, protects against tetanus and diphtheria, but not pertussis. A Td booster should be given every 10 years. Tdap may be given as one of these boosters if you have never gotten Tdap before. Tdap may also be given after a severe cut or burn to prevent tetanus infection. Your doctor or the person giving you the vaccine can give you more information. Tdap may safely be given at the same time as other vaccines. 3. Some people should not get this vaccine  A person who has ever had a life-threatening allergic reaction after a previous dose of any diphtheria, tetanus or pertussis containing vaccine, OR has a severe allergy to any part of this vaccine, should not get Tdap vaccine. Tell the person giving the vaccine about any severe allergies.  Anyone who had coma or long repeated seizures within 7 days after a childhood dose of DTP or DTaP, or a previous dose of Tdap, should not get Tdap, unless a cause other than the vaccine was found. They can still get Td.  Talk to your doctor if you:  have seizures or another nervous system problem,  had severe pain or swelling after any vaccine containing diphtheria, tetanus or pertussis,  ever had a condition called Guillain-Barr Syndrome (GBS),  aren't feeling   well on the day the shot is scheduled. 4. Risks With any medicine, including vaccines, there is a chance of side effects. These are usually mild and go away on their own. Serious reactions are also possible but are rare. Most people who get Tdap vaccine do not have any problems with it. Mild  problems following Tdap:  (Did not interfere with activities)  Pain where the shot was given (about 3 in 4 adolescents or 2 in 3 adults)  Redness or swelling where the shot was given (about 1 person in 5)  Mild fever of at least 100.4F (up to about 1 in 25 adolescents or 1 in 100 adults)  Headache (about 3 or 4 people in 10)  Tiredness (about 1 person in 3 or 4)  Nausea, vomiting, diarrhea, stomach ache (up to 1 in 4 adolescents or 1 in 10 adults)  Chills, sore joints (about 1 person in 10)  Body aches (about 1 person in 3 or 4)  Rash, swollen glands (uncommon) Moderate problems following Tdap:  (Interfered with activities, but did not require medical attention)  Pain where the shot was given (up to 1 in 5 or 6)  Redness or swelling where the shot was given (up to about 1 in 16 adolescents or 1 in 12 adults)  Fever over 102F (about 1 in 100 adolescents or 1 in 250 adults)  Headache (about 1 in 7 adolescents or 1 in 10 adults)  Nausea, vomiting, diarrhea, stomach ache (up to 1 or 3 people in 100)  Swelling of the entire arm where the shot was given (up to about 1 in 500). Severe problems following Tdap:  (Unable to perform usual activities; required medical attention)  Swelling, severe pain, bleeding and redness in the arm where the shot was given (rare). Problems that could happen after any vaccine:   People sometimes faint after a medical procedure, including vaccination. Sitting or lying down for about 15 minutes can help prevent fainting, and injuries caused by a fall. Tell your doctor if you feel dizzy, or have vision changes or ringing in the ears.  Some people get severe pain in the shoulder and have difficulty moving the arm where a shot was given. This happens very rarely.  Any medication can cause a severe allergic reaction. Such reactions from a vaccine are very rare, estimated at fewer than 1 in a million doses, and would happen within a few minutes to a few  hours after the vaccination. As with any medicine, there is a very remote chance of a vaccine causing a serious injury or death. The safety of vaccines is always being monitored. For more information, visit: www.cdc.gov/vaccinesafety/ 5. What if there is a serious problem? What should I look for?  Look for anything that concerns you, such as signs of a severe allergic reaction, very high fever, or unusual behavior. Signs of a severe allergic reaction can include hives, swelling of the face and throat, difficulty breathing, a fast heartbeat, dizziness, and weakness. These would usually start a few minutes to a few hours after the vaccination. What should I do?   If you think it is a severe allergic reaction or other emergency that can't wait, call 9-1-1 or get the person to the nearest hospital. Otherwise, call your doctor.  Afterward, the reaction should be reported to the Vaccine Adverse Event Reporting System (VAERS). Your doctor might file this report, or you can do it yourself through the VAERS web site at www.vaers.hhs.gov, or by calling   1-800-822-7967.  VAERS does not give medical advice. 6. The National Vaccine Injury Compensation Program The National Vaccine Injury Compensation Program (VICP) is a federal program that was created to compensate people who may have been injured by certain vaccines. Persons who believe they may have been injured by a vaccine can learn about the program and about filing a claim by calling 1-800-338-2382 or visiting the VICP website at www.hrsa.gov/vaccinecompensation. There is a time limit to file a claim for compensation. 7. How can I learn more?  Ask your doctor. He or she can give you the vaccine package insert or suggest other sources of information.  Call your local or state health department.  Contact the Centers for Disease Control and Prevention (CDC):  Call 1-800-232-4636 (1-800-CDC-INFO) or  Visit CDC's website at www.cdc.gov/vaccines CDC  Tdap Vaccine VIS (09/19/13) This information is not intended to replace advice given to you by your health care provider. Make sure you discuss any questions you have with your health care provider. Document Released: 01/12/2012 Document Revised: 04/02/2016 Document Reviewed: 04/02/2016 Elsevier Interactive Patient Education  2017 Elsevier Inc.  

## 2017-08-20 ENCOUNTER — Ambulatory Visit (HOSPITAL_COMMUNITY)
Admission: RE | Admit: 2017-08-20 | Discharge: 2017-08-20 | Disposition: A | Discharge status: Home / Self Care | Payer: BLUE CROSS/BLUE SHIELD | Source: Ambulatory Visit | Attending: Obstetrics & Gynecology | Admitting: Obstetrics & Gynecology

## 2017-08-20 ENCOUNTER — Other Ambulatory Visit: Payer: Self-pay | Admitting: Obstetrics & Gynecology

## 2017-08-20 DIAGNOSIS — Z0489 Encounter for examination and observation for other specified reasons: Secondary | ICD-10-CM

## 2017-08-20 DIAGNOSIS — O99212 Obesity complicating pregnancy, second trimester: Secondary | ICD-10-CM | POA: Diagnosis present

## 2017-08-20 DIAGNOSIS — IMO0002 Reserved for concepts with insufficient information to code with codable children: Secondary | ICD-10-CM

## 2017-08-20 DIAGNOSIS — Z3A24 24 weeks gestation of pregnancy: Secondary | ICD-10-CM | POA: Insufficient documentation

## 2017-08-20 DIAGNOSIS — Z362 Encounter for other antenatal screening follow-up: Secondary | ICD-10-CM | POA: Insufficient documentation

## 2017-09-10 ENCOUNTER — Encounter: Payer: Self-pay | Admitting: Obstetrics & Gynecology

## 2017-09-10 ENCOUNTER — Ambulatory Visit (INDEPENDENT_AMBULATORY_CARE_PROVIDER_SITE_OTHER): Payer: BLUE CROSS/BLUE SHIELD | Admitting: Obstetrics & Gynecology

## 2017-09-10 VITALS — BP 111/69 | HR 91 | Wt 191.0 lb

## 2017-09-10 DIAGNOSIS — Z3480 Encounter for supervision of other normal pregnancy, unspecified trimester: Secondary | ICD-10-CM

## 2017-09-10 DIAGNOSIS — Z23 Encounter for immunization: Secondary | ICD-10-CM | POA: Diagnosis not present

## 2017-09-10 DIAGNOSIS — Z3482 Encounter for supervision of other normal pregnancy, second trimester: Secondary | ICD-10-CM

## 2017-09-10 NOTE — Progress Notes (Signed)
   PRENATAL VISIT NOTE  Subjective:  Adrienne Griffin is a 27 y.o. G2P1001 at 2386w4d being seen today for ongoing prenatal care.  She is currently monitored for the following issues for this low-risk pregnancy and has Supervision of other normal pregnancy, antepartum on their problem list.  Patient reports no complaints.  Contractions: Not present. Vag. Bleeding: None.  Movement: Present. Denies leaking of fluid.   The following portions of the patient's history were reviewed and updated as appropriate: allergies, current medications, past family history, past medical history, past social history, past surgical history and problem list. Problem list updated.  Objective:   Vitals:   09/10/17 0827  BP: 111/69  Pulse: 91  Weight: 191 lb (86.6 kg)    Fetal Status: Fetal Heart Rate (bpm): 140 Fundal Height: 28 cm Movement: Present     General:  Alert, oriented and cooperative. Patient is in no acute distress.  Skin: Skin is warm and dry. No rash noted.   Cardiovascular: Normal heart rate noted  Respiratory: Normal respiratory effort, no problems with respiration noted  Abdomen: Soft, gravid, appropriate for gestational age.  Pain/Pressure: Absent     Pelvic: Cervical exam deferred        Extremities: Normal range of motion.  Edema: None  Mental Status:  Normal mood and affect. Normal behavior. Normal judgment and thought content.   Assessment and Plan:  Pregnancy: G2P1001 at 2786w4d  1. Supervision of other normal pregnancy, antepartum Tdap, Flu and third trimester labs done today - Tdap and Flu vaccines greater than or equal to 7yo IM - CBC - Glucose Tolerance, 2 Hours w/1 Hour - HIV antibody - RPR Preterm labor symptoms and general obstetric precautions including but not limited to vaginal bleeding, contractions, leaking of fluid and fetal movement were reviewed in detail with the patient. Please refer to After Visit Summary for other counseling recommendations.  Return in  about 2 weeks (around 09/24/2017) for OB Visit.   Jaynie CollinsUgonna Denishia Citro, MD

## 2017-09-10 NOTE — Patient Instructions (Signed)
Return to clinic for any scheduled appointments or obstetric concerns, or go to [facility] for evaluation   Third Trimester of Pregnancy The third trimester is from week 28 through week 40 (months 7 through 9). The third trimester is a time when the unborn baby (fetus) is growing rapidly. At the end of the ninth month, the fetus is about 20 inches in length and weighs 6-10 pounds. Body changes during your third trimester Your body will continue to go through many changes during pregnancy. The changes vary from woman to woman. During the third trimester:  Your weight will continue to increase. You can expect to gain 25-35 pounds (11-16 kg) by the end of the pregnancy.  You may begin to get stretch marks on your hips, abdomen, and breasts.  You may urinate more often because the fetus is moving lower into your pelvis and pressing on your bladder.  You may develop or continue to have heartburn. This is caused by increased hormones that slow down muscles in the digestive tract.  You may develop or continue to have constipation because increased hormones slow digestion and cause the muscles that push waste through your intestines to relax.  You may develop hemorrhoids. These are swollen veins (varicose veins) in the rectum that can itch or be painful.  You may develop swollen, bulging veins (varicose veins) in your legs.  You may have increased body aches in the pelvis, back, or thighs. This is due to weight gain and increased hormones that are relaxing your joints.  You may have changes in your hair. These can include thickening of your hair, rapid growth, and changes in texture. Some women also have hair loss during or after pregnancy, or hair that feels dry or thin. Your hair will most likely return to normal after your baby is born.  Your breasts will continue to grow and they will continue to become tender. A yellow fluid (colostrum) may leak from your breasts. This is the first milk you are  producing for your baby.  Your belly button may stick out.  You may notice more swelling in your hands, face, or ankles.  You may have increased tingling or numbness in your hands, arms, and legs. The skin on your belly may also feel numb.  You may feel short of breath because of your expanding uterus.  You may have more problems sleeping. This can be caused by the size of your belly, increased need to urinate, and an increase in your body's metabolism.  You may notice the fetus "dropping," or moving lower in your abdomen (lightening).  You may have increased vaginal discharge.  You may notice your joints feel loose and you may have pain around your pelvic bone.  What to expect at prenatal visits You will have prenatal exams every 2 weeks until week 36. Then you will have weekly prenatal exams. During a routine prenatal visit:  You will be weighed to make sure you and the baby are growing normally.  Your blood pressure will be taken.  Your abdomen will be measured to track your baby's growth.  The fetal heartbeat will be listened to.  Any test results from the previous visit will be discussed.  You may have a cervical check near your due date to see if your cervix has softened or thinned (effaced).  You will be tested for Group B streptococcus. This happens between 35 and 37 weeks.  Your health care provider may ask you:  What your birth plan is.    How you are feeling.  If you are feeling the baby move.  If you have had any abnormal symptoms, such as leaking fluid, bleeding, severe headaches, or abdominal cramping.  If you are using any tobacco products, including cigarettes, chewing tobacco, and electronic cigarettes.  If you have any questions.  Other tests or screenings that may be performed during your third trimester include:  Blood tests that check for low iron levels (anemia).  Fetal testing to check the health, activity level, and growth of the fetus.  Testing is done if you have certain medical conditions or if there are problems during the pregnancy.  Nonstress test (NST). This test checks the health of your baby to make sure there are no signs of problems, such as the baby not getting enough oxygen. During this test, a belt is placed around your belly. The baby is made to move, and its heart rate is monitored during movement.  What is false labor? False labor is a condition in which you feel small, irregular tightenings of the muscles in the womb (contractions) that usually go away with rest, changing position, or drinking water. These are called Braxton Hicks contractions. Contractions may last for hours, days, or even weeks before true labor sets in. If contractions come at regular intervals, become more frequent, increase in intensity, or become painful, you should see your health care provider. What are the signs of labor?  Abdominal cramps.  Regular contractions that start at 10 minutes apart and become stronger and more frequent with time.  Contractions that start on the top of the uterus and spread down to the lower abdomen and back.  Increased pelvic pressure and dull back pain.  A watery or bloody mucus discharge that comes from the vagina.  Leaking of amniotic fluid. This is also known as your "water breaking." It could be a slow trickle or a gush. Let your health care provider know if it has a color or strange odor. If you have any of these signs, call your health care provider right away, even if it is before your due date. Follow these instructions at home: Medicines  Follow your health care provider's instructions regarding medicine use. Specific medicines may be either safe or unsafe to take during pregnancy.  Take a prenatal vitamin that contains at least 600 micrograms (mcg) of folic acid.  If you develop constipation, try taking a stool softener if your health care provider approves. Eating and drinking  Eat a  balanced diet that includes fresh fruits and vegetables, whole grains, good sources of protein such as meat, eggs, or tofu, and low-fat dairy. Your health care provider will help you determine the amount of weight gain that is right for you.  Avoid raw meat and uncooked cheese. These carry germs that can cause birth defects in the baby.  If you have low calcium intake from food, talk to your health care provider about whether you should take a daily calcium supplement.  Eat four or five small meals rather than three large meals a day.  Limit foods that are high in fat and processed sugars, such as fried and sweet foods.  To prevent constipation: ? Drink enough fluid to keep your urine clear or pale yellow. ? Eat foods that are high in fiber, such as fresh fruits and vegetables, whole grains, and beans. Activity  Exercise only as directed by your health care provider. Most women can continue their usual exercise routine during pregnancy. Try to exercise for 30   minutes at least 5 days a week. Stop exercising if you experience uterine contractions.  Avoid heavy lifting.  Do not exercise in extreme heat or humidity, or at high altitudes.  Wear low-heel, comfortable shoes.  Practice good posture.  You may continue to have sex unless your health care provider tells you otherwise. Relieving pain and discomfort  Take frequent breaks and rest with your legs elevated if you have leg cramps or low back pain.  Take warm sitz baths to soothe any pain or discomfort caused by hemorrhoids. Use hemorrhoid cream if your health care provider approves.  Wear a good support bra to prevent discomfort from breast tenderness.  If you develop varicose veins: ? Wear support pantyhose or compression stockings as told by your healthcare provider. ? Elevate your feet for 15 minutes, 3-4 times a day. Prenatal care  Write down your questions. Take them to your prenatal visits.  Keep all your prenatal  visits as told by your health care provider. This is important. Safety  Wear your seat belt at all times when driving.  Make a list of emergency phone numbers, including numbers for family, friends, the hospital, and police and fire departments. General instructions  Avoid cat litter boxes and soil used by cats. These carry germs that can cause birth defects in the baby. If you have a cat, ask someone to clean the litter box for you.  Do not travel far distances unless it is absolutely necessary and only with the approval of your health care provider.  Do not use hot tubs, steam rooms, or saunas.  Do not drink alcohol.  Do not use any products that contain nicotine or tobacco, such as cigarettes and e-cigarettes. If you need help quitting, ask your health care provider.  Do not use any medicinal herbs or unprescribed drugs. These chemicals affect the formation and growth of the baby.  Do not douche or use tampons or scented sanitary pads.  Do not cross your legs for long periods of time.  To prepare for the arrival of your baby: ? Take prenatal classes to understand, practice, and ask questions about labor and delivery. ? Make a trial run to the hospital. ? Visit the hospital and tour the maternity area. ? Arrange for maternity or paternity leave through employers. ? Arrange for family and friends to take care of pets while you are in the hospital. ? Purchase a rear-facing car seat and make sure you know how to install it in your car. ? Pack your hospital bag. ? Prepare the baby's nursery. Make sure to remove all pillows and stuffed animals from the baby's crib to prevent suffocation.  Visit your dentist if you have not gone during your pregnancy. Use a soft toothbrush to brush your teeth and be gentle when you floss. Contact a health care provider if:  You are unsure if you are in labor or if your water has broken.  You become dizzy.  You have mild pelvic cramps, pelvic  pressure, or nagging pain in your abdominal area.  You have lower back pain.  You have persistent nausea, vomiting, or diarrhea.  You have an unusual or bad smelling vaginal discharge.  You have pain when you urinate. Get help right away if:  Your water breaks before 37 weeks.  You have regular contractions less than 5 minutes apart before 37 weeks.  You have a fever.  You are leaking fluid from your vagina.  You have spotting or bleeding from your vagina.    You have severe abdominal pain or cramping.  You have rapid weight loss or weight gain.  You have shortness of breath with chest pain.  You notice sudden or extreme swelling of your face, hands, ankles, feet, or legs.  Your baby makes fewer than 10 movements in 2 hours.  You have severe headaches that do not go away when you take medicine.  You have vision changes. Summary  The third trimester is from week 28 through week 40, months 7 through 9. The third trimester is a time when the unborn baby (fetus) is growing rapidly.  During the third trimester, your discomfort may increase as you and your baby continue to gain weight. You may have abdominal, leg, and back pain, sleeping problems, and an increased need to urinate.  During the third trimester your breasts will keep growing and they will continue to become tender. A yellow fluid (colostrum) may leak from your breasts. This is the first milk you are producing for your baby.  False labor is a condition in which you feel small, irregular tightenings of the muscles in the womb (contractions) that eventually go away. These are called Braxton Hicks contractions. Contractions may last for hours, days, or even weeks before true labor sets in.  Signs of labor can include: abdominal cramps; regular contractions that start at 10 minutes apart and become stronger and more frequent with time; watery or bloody mucus discharge that comes from the vagina; increased pelvic pressure  and dull back pain; and leaking of amniotic fluid. This information is not intended to replace advice given to you by your health care provider. Make sure you discuss any questions you have with your health care provider. Document Released: 07/07/2001 Document Revised: 12/19/2015 Document Reviewed: 09/13/2012 Elsevier Interactive Patient Education  2017 [employer]  

## 2017-09-13 LAB — CBC
HEMOGLOBIN: 10.4 g/dL — AB (ref 11.1–15.9)
Hematocrit: 30.3 % — ABNORMAL LOW (ref 34.0–46.6)
MCH: 27.7 pg (ref 26.6–33.0)
MCHC: 34.3 g/dL (ref 31.5–35.7)
MCV: 81 fL (ref 79–97)
Platelets: 242 10*3/uL (ref 150–379)
RBC: 3.76 x10E6/uL — AB (ref 3.77–5.28)
RDW: 14.4 % (ref 12.3–15.4)
WBC: 7.3 10*3/uL (ref 3.4–10.8)

## 2017-09-13 LAB — GLUCOSE TOLERANCE, 2 HOURS W/ 1HR
Glucose, 1 hour: 163 mg/dL (ref 65–179)
Glucose, 2 hour: 108 mg/dL (ref 65–152)
Glucose, Fasting: 80 mg/dL (ref 65–91)

## 2017-09-13 LAB — HIV ANTIBODY (ROUTINE TESTING W REFLEX): HIV SCREEN 4TH GENERATION: NONREACTIVE

## 2017-09-13 LAB — RPR: RPR: NONREACTIVE

## 2017-09-15 ENCOUNTER — Ambulatory Visit: Payer: BLUE CROSS/BLUE SHIELD

## 2017-09-15 VITALS — BP 115/75 | HR 82 | Wt 191.4 lb

## 2017-09-15 DIAGNOSIS — Z0130 Encounter for examination of blood pressure without abnormal findings: Secondary | ICD-10-CM

## 2017-09-15 NOTE — Progress Notes (Signed)
Received call from patient today stating she was having right eye pain that started today. Patient reports some vision loss for a second or so. Patient reports having a bagel for breakfast this am. She was ask to come into the office for a blood pressure check to make sure her pressure was stable. Patient blood pressure was 115/72. She reports feeling better now. 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 Patient appears to be fine and this time. I have advised her to take frequent breaks if her employer will allow time away from the computer screen. Patient was advised to call us back with any concerns or problems that may come up.

## 2017-09-24 ENCOUNTER — Ambulatory Visit (INDEPENDENT_AMBULATORY_CARE_PROVIDER_SITE_OTHER): Payer: BLUE CROSS/BLUE SHIELD | Admitting: Obstetrics & Gynecology

## 2017-09-24 DIAGNOSIS — Z3483 Encounter for supervision of other normal pregnancy, third trimester: Secondary | ICD-10-CM

## 2017-09-24 DIAGNOSIS — Z3480 Encounter for supervision of other normal pregnancy, unspecified trimester: Secondary | ICD-10-CM

## 2017-09-24 NOTE — Patient Instructions (Signed)
Return to clinic for any scheduled appointments or obstetric concerns, or go to [facility] for evaluation    Contraception Choices Contraception, also called birth control, refers to methods or devices that prevent pregnancy. Hormonal methods Contraceptive implant A contraceptive implant is a thin, plastic tube that contains a hormone. It is inserted into the upper part of the arm. It can remain in place for up to 3 years. Progestin-only injections Progestin-only injections are injections of progestin, a synthetic form of the hormone progesterone. They are given every 3 months by a health care provider. Birth control pills Birth control pills are pills that contain hormones that prevent pregnancy. They must be taken once a day, preferably at the same time each day. Birth control patch The birth control patch contains hormones that prevent pregnancy. It is placed on the skin and must be changed once a week for three weeks and removed on the fourth week. A prescription is needed to use this method of contraception. Vaginal ring A vaginal ring contains hormones that prevent pregnancy. It is placed in the vagina for three weeks and removed on the fourth week. After that, the process is repeated with a new ring. A prescription is needed to use this method of contraception. Emergency contraceptive Emergency contraceptives prevent pregnancy after unprotected sex. They come in pill form and can be taken up to 5 days after sex. They work best the sooner they are taken after having sex. Most emergency contraceptives are available without a prescription. This method should not be used as your only form of birth control. Barrier methods Female condom A female condom is a thin sheath that is worn over the penis during sex. Condoms keep sperm from going inside a woman's body. They can be used with a spermicide to increase their effectiveness. They should be disposed after a single use. Female condom A female condom is  a soft, loose-fitting sheath that is put into the vagina before sex. The condom keeps sperm from going inside a woman's body. They should be disposed after a single use. Diaphragm A diaphragm is a soft, dome-shaped barrier. It is inserted into the vagina before sex, along with a spermicide. The diaphragm blocks sperm from entering the uterus, and the spermicide kills sperm. A diaphragm should be left in the vagina for 6-8 hours after sex and removed within 24 hours. A diaphragm is prescribed and fitted by a health care provider. A diaphragm should be replaced every 1-2 years, after giving birth, after gaining more than 15 lb (6.8 kg), and after pelvic surgery. Cervical cap A cervical cap is a round, soft latex or plastic cup that fits over the cervix. It is inserted into the vagina before sex, along with spermicide. It blocks sperm from entering the uterus. The cap should be left in place for 6-8 hours after sex and removed within 48 hours. A cervical cap must be prescribed and fitted by a health care provider. It should be replaced every 2 years. Sponge A sponge is a soft, circular piece of polyurethane foam with spermicide on it. The sponge helps block sperm from entering the uterus, and the spermicide kills sperm. To use it, you make it wet and then insert it into the vagina. It should be inserted before sex, left in for at least 6 hours after sex, and removed and thrown away within 30 hours. Spermicides Spermicides are chemicals that kill or block sperm from entering the cervix and uterus. They can come as a cream, jelly, suppository,  foam, or tablet. A spermicide should be inserted into the vagina with an applicator at least 10-15 minutes before sex to allow time for it to work. The process must be repeated every time you have sex. Spermicides do not require a prescription. Intrauterine contraception Intrauterine device (IUD) An IUD is a T-shaped device that is put in a woman's uterus. There are two  types:  Hormone IUD.This type contains progestin, a synthetic form of the hormone progesterone. This type can stay in place for 3-5 years.  Copper IUD.This type is wrapped in copper wire. It can stay in place for 10 years.  Permanent methods of contraception Female tubal ligation In this method, a woman's fallopian tubes are sealed, tied, or blocked during surgery to prevent eggs from traveling to the uterus. Hysteroscopic sterilization In this method, a small, flexible insert is placed into each fallopian tube. The inserts cause scar tissue to form in the fallopian tubes and block them, so sperm cannot reach an egg. The procedure takes about 3 months to be effective. Another form of birth control must be used during those 3 months. Female sterilization This is a procedure to tie off the tubes that carry sperm (vasectomy). After the procedure, the man can still ejaculate fluid (semen). Natural planning methods Natural family planning In this method, a couple does not have sex on days when the woman could become pregnant. Calendar method This means keeping track of the length of each menstrual cycle, identifying the days when pregnancy can happen, and not having sex on those days. Ovulation method In this method, a couple avoids sex during ovulation. Symptothermal method This method involves not having sex during ovulation. The woman typically checks for ovulation by watching changes in her temperature and in the consistency of cervical mucus. Post-ovulation method In this method, a couple waits to have sex until after ovulation. Summary  Contraception, also called birth control, means methods or devices that prevent pregnancy.  Hormonal methods of contraception include implants, injections, pills, patches, vaginal rings, and emergency contraceptives.  Barrier methods of contraception can include female condoms, female condoms, diaphragms, cervical caps, sponges, and spermicides.  There  are two types of IUDs (intrauterine devices). An IUD can be put in a woman's uterus to prevent pregnancy for 3-5 years.  Permanent sterilization can be done through a procedure for males, females, or both.  Natural family planning methods involve not having sex on days when the woman could become pregnant. This information is not intended to replace advice given to you by your health care provider. Make sure you discuss any questions you have with your health care provider. Document Released: 07/13/2005 Document Revised: 08/15/2016 Document Reviewed: 08/15/2016 Elsevier Interactive Patient Education  2018 [employer]    

## 2017-09-24 NOTE — Progress Notes (Signed)
   PRENATAL VISIT NOTE  Subjective:  Adrienne CitrinCharlotte Konte Griffin is a 27 y.o. G2P1001 at 179w4d being seen today for ongoing prenatal care.  She is currently monitored for the following issues for this low-risk pregnancy and has Supervision of other normal pregnancy, antepartum on their problem list.  Patient reports no complaints.  Contractions: Not present. Vag. Bleeding: None.  Movement: Present. Denies leaking of fluid.   The following portions of the patient's history were reviewed and updated as appropriate: allergies, current medications, past family history, past medical history, past social history, past surgical history and problem list. Problem list updated.  Objective:   Vitals:   09/24/17 1107  BP: 109/68  Pulse: 87  Weight: 190 lb (86.2 kg)    Fetal Status: Fetal Heart Rate (bpm): 141 Fundal Height: 30 cm Movement: Present     General:  Alert, oriented and cooperative. Patient is in no acute distress.  Skin: Skin is warm and dry. No rash noted.   Cardiovascular: Normal heart rate noted  Respiratory: Normal respiratory effort, no problems with respiration noted  Abdomen: Soft, gravid, appropriate for gestational age.  Pain/Pressure: Absent     Pelvic: Cervical exam deferred        Extremities: Normal range of motion.  Edema: None  Mental Status:  Normal mood and affect. Normal behavior. Normal judgment and thought content.   Assessment and Plan:  Pregnancy: G2P1001 at 257w4d  1. Supervision of other normal pregnancy, antepartum Normal third trimester labs, discussed with patient. Preterm labor symptoms and general obstetric precautions including but not limited to vaginal bleeding, contractions, leaking of fluid and fetal movement were reviewed in detail with the patient. Please refer to After Visit Summary for other counseling recommendations.  Return in about 2 weeks (around 10/08/2017) for OB Visit.   Jaynie CollinsUgonna Cheikh Bramble, MD

## 2017-10-08 ENCOUNTER — Encounter: Payer: Self-pay | Admitting: Obstetrics and Gynecology

## 2017-10-08 ENCOUNTER — Ambulatory Visit (INDEPENDENT_AMBULATORY_CARE_PROVIDER_SITE_OTHER): Payer: BLUE CROSS/BLUE SHIELD | Admitting: Obstetrics and Gynecology

## 2017-10-08 VITALS — BP 105/73 | HR 103 | Wt 194.0 lb

## 2017-10-08 DIAGNOSIS — Z3480 Encounter for supervision of other normal pregnancy, unspecified trimester: Secondary | ICD-10-CM

## 2017-10-08 NOTE — Progress Notes (Signed)
   PRENATAL VISIT NOTE  Subjective:  Adrienne Griffin is a 27 y.o. G2P1001 at 3668w4d being seen today for ongoing prenatal care.  She is currently monitored for the following issues for this low-risk pregnancy and has Supervision of other normal pregnancy, antepartum on their problem list.  Patient reports no complaints.  Contractions: Not present. Vag. Bleeding: None.  Movement: Present. Denies leaking of fluid.   The following portions of the patient's history were reviewed and updated as appropriate: allergies, current medications, past family history, past medical history, past social history, past surgical history and problem list. Problem list updated.  Objective:   Vitals:   10/08/17 1105  BP: 105/73  Pulse: (!) 103  Weight: 194 lb (88 kg)    Fetal Status: Fetal Heart Rate (bpm): 142 Fundal Height: 32 cm Movement: Present     General:  Alert, oriented and cooperative. Patient is in no acute distress.  Skin: Skin is warm and dry. No rash noted.   Cardiovascular: Normal heart rate noted  Respiratory: Normal respiratory effort, no problems with respiration noted  Abdomen: Soft, gravid, appropriate for gestational age.  Pain/Pressure: Present     Pelvic: Cervical exam deferred        Extremities: Normal range of motion.  Edema: None  Mental Status:  Normal mood and affect. Normal behavior. Normal judgment and thought content.   Assessment and Plan:  Pregnancy: G2P1001 at 5968w4d  1. Supervision of other normal pregnancy, antepartum Patient is doing well without complaints   Preterm labor symptoms and general obstetric precautions including but not limited to vaginal bleeding, contractions, leaking of fluid and fetal movement were reviewed in detail with the patient. Please refer to After Visit Summary for other counseling recommendations.  Return in about 2 weeks (around 10/22/2017) for ROB.   Catalina AntiguaPeggy Cooper Stamp, MD

## 2017-10-20 ENCOUNTER — Telehealth: Payer: Self-pay | Admitting: *Deleted

## 2017-10-20 ENCOUNTER — Encounter: Payer: Self-pay | Admitting: Radiology

## 2017-10-20 ENCOUNTER — Ambulatory Visit (INDEPENDENT_AMBULATORY_CARE_PROVIDER_SITE_OTHER): Payer: BLUE CROSS/BLUE SHIELD | Admitting: Family Medicine

## 2017-10-20 ENCOUNTER — Encounter: Payer: Self-pay | Admitting: Family Medicine

## 2017-10-20 VITALS — BP 115/71 | HR 94 | Wt 194.0 lb

## 2017-10-20 DIAGNOSIS — Z3480 Encounter for supervision of other normal pregnancy, unspecified trimester: Secondary | ICD-10-CM

## 2017-10-20 DIAGNOSIS — Z3483 Encounter for supervision of other normal pregnancy, third trimester: Secondary | ICD-10-CM

## 2017-10-20 DIAGNOSIS — O4700 False labor before 37 completed weeks of gestation, unspecified trimester: Secondary | ICD-10-CM

## 2017-10-20 MED ORDER — NIFEDIPINE ER OSMOTIC RELEASE 30 MG PO TB24: 30.0000 mg | ORAL_TABLET | Freq: Every day | ORAL | 1 refills | Status: DC

## 2017-10-20 MED ORDER — BETAMETHASONE SOD PHOS & ACET 6 (3-3) MG/ML IJ SUSP
12.0000 mg | Freq: Once | INTRAMUSCULAR | Status: AC
  Administered 2017-10-20: 12 mg via INTRAMUSCULAR

## 2017-10-20 NOTE — Progress Notes (Signed)
Pt wants cervical check d/t increased vaginal pressure and mucus vaginal discharge.

## 2017-10-20 NOTE — Telephone Encounter (Signed)
Pt states she went to bathroom and starting aching in pelvic area. She states she has a lot of mucus vaginal discharge. She is very uncomfortable. Spoke to Dr Shawnie PonsPratt and she approved work in today. Pt adv she will be here shortly.

## 2017-10-20 NOTE — Progress Notes (Signed)
   PRENATAL VISIT NOTE  Subjective:  Adrienne Griffin is a 27 y.o. G2P1001 at 8180w2d being seen today for ongoing prenatal care.  She is currently monitored for the following issues for this low-risk pregnancy and has Supervision of other normal pregnancy, antepartum on their problem list.  Patient reports contractions since yesterday, lost mucous plug.  Contractions: Irregular. Vag. Bleeding: None.  Movement: Present. Denies leaking of fluid.   The following portions of the patient's history were reviewed and updated as appropriate: allergies, current medications, past family history, past medical history, past social history, past surgical history and problem list. Problem list updated.  Objective:   Vitals:   10/20/17 0954  BP: 115/71  Pulse: 94  Weight: 194 lb (88 kg)    Fetal Status: Fetal Heart Rate (bpm): 150   Movement: Present     General:  Alert, oriented and cooperative. Patient is in no acute distress.  Skin: Skin is warm and dry. No rash noted.   Cardiovascular: Normal heart rate noted  Respiratory: Normal respiratory effort, no problems with respiration noted  Abdomen: Soft, gravid, appropriate for gestational age.  Pain/Pressure: Present     Pelvic: Cervical exam performed Dilation: Closed Effacement (%): 70 Station: -2 external os is 1 cm, could not get all the way through, cervix is soft and posterior  Extremities: Normal range of motion.  Edema: None  Mental Status:  Normal mood and affect. Normal behavior. Normal judgment and thought content.   Assessment and Plan:  Pregnancy: G2P1001 at 6180w2d  1. Supervision of other normal pregnancy, antepartum   2. Threatened preterm labor, antepartum BMZ x 1 today and repeat in 24 hour, with cervical re-check-Add procardia. - NIFEdipine (PROCARDIA XL) 30 MG 24 hr tablet; Take 1 tablet (30 mg total) by mouth daily.  Dispense: 60 tablet; Refill: 1 - betamethasone acetate-betamethasone sodium phosphate (CELESTONE)  injection 12 mg  Preterm labor symptoms and general obstetric precautions including but not limited to vaginal bleeding, contractions, leaking of fluid and fetal movement were reviewed in detail with the patient. Please refer to After Visit Summary for other counseling recommendations.  Return in 2 weeks (on 11/03/2017).   Reva Boresanya S Pratt, MD

## 2017-10-20 NOTE — Patient Instructions (Signed)

## 2017-10-21 ENCOUNTER — Encounter: Payer: BLUE CROSS/BLUE SHIELD | Admitting: Family Medicine

## 2017-10-21 ENCOUNTER — Ambulatory Visit (INDEPENDENT_AMBULATORY_CARE_PROVIDER_SITE_OTHER): Payer: BLUE CROSS/BLUE SHIELD

## 2017-10-21 VITALS — BP 116/72 | HR 76

## 2017-10-21 DIAGNOSIS — Z3480 Encounter for supervision of other normal pregnancy, unspecified trimester: Secondary | ICD-10-CM

## 2017-10-21 MED ORDER — BETAMETHASONE SOD PHOS & ACET 6 (3-3) MG/ML IJ SUSP
12.0000 mg | Freq: Once | INTRAMUSCULAR | Status: AC
  Administered 2017-10-21: 12 mg via INTRAMUSCULAR

## 2017-10-21 NOTE — Progress Notes (Signed)
Patient presented to the office today for her 2nd betamethasone  Injection received in her left ventrogluteal. Patient tolerated well and will follow up at her next office visit.

## 2017-11-03 ENCOUNTER — Ambulatory Visit (INDEPENDENT_AMBULATORY_CARE_PROVIDER_SITE_OTHER): Payer: BLUE CROSS/BLUE SHIELD | Admitting: Obstetrics and Gynecology

## 2017-11-03 VITALS — BP 98/64 | HR 103 | Wt 194.8 lb

## 2017-11-03 DIAGNOSIS — Z3483 Encounter for supervision of other normal pregnancy, third trimester: Secondary | ICD-10-CM

## 2017-11-03 DIAGNOSIS — Z3480 Encounter for supervision of other normal pregnancy, unspecified trimester: Secondary | ICD-10-CM

## 2017-11-03 NOTE — Progress Notes (Addendum)
error 

## 2017-11-03 NOTE — Progress Notes (Signed)
Prenatal Visit Note Date: 11/03/2017 Clinic: [facility]  Subjective:  Adrienne Griffin is a 27 y.o. G2P1001 at 5563w2d being seen today for ongoing prenatal care.  She is currently monitored for the following issues for this low-risk pregnancy and has Supervision of other normal pregnancy, antepartum on their problem list.  Patient reports no complaints.   Contractions: Not present. Vag. Bleeding: None.  Movement: Present. Denies leaking of fluid.   The following portions of the patient's history were reviewed and updated as appropriate: allergies, current medications, past family history, past medical history, past social history, past surgical history and problem list. Problem list updated.  Objective:   Vitals:   11/03/17 1036  BP: 98/64  Pulse: (!) 103  Weight: 194 lb 12.8 oz (88.4 kg)    Fetal Status: Fetal Heart Rate (bpm): 145 Fundal Height: 35 cm Movement: Present  Presentation: Vertex  General:  Alert, oriented and cooperative. Patient is in no acute distress.  Skin: Skin is warm and dry. No rash noted.   Cardiovascular: Normal heart rate noted  Respiratory: Normal respiratory effort, no problems with respiration noted  Abdomen: Soft, gravid, appropriate for gestational age. Pain/Pressure: Present     Pelvic:  Cervical exam deferred        Extremities: Normal range of motion.  Edema: None  Mental Status: Normal mood and affect. Normal behavior. Normal judgment and thought content.   Urinalysis:      Assessment and Plan:  Pregnancy: G2P1001 at 363w2d  1. Supervision of other normal pregnancy, antepartum Routine care. GBS nv. Pt feels well and stopped procardia.   Preterm labor symptoms and general obstetric precautions including but not limited to vaginal bleeding, contractions, leaking of fluid and fetal movement were reviewed in detail with the patient. Please refer to After Visit Summary for other counseling recommendations.  Return in about  1 week (around 11/10/2017) for 7-10d rob and gbs.   [place] BingPickens, Alayzia Pavlock, MD

## 2017-11-11 ENCOUNTER — Other Ambulatory Visit (HOSPITAL_COMMUNITY)
Admission: RE | Admit: 2017-11-11 | Discharge: 2017-11-11 | Disposition: A | Discharge status: Home / Self Care | Payer: BLUE CROSS/BLUE SHIELD | Source: Ambulatory Visit | Attending: Family Medicine | Admitting: Family Medicine

## 2017-11-11 ENCOUNTER — Ambulatory Visit (INDEPENDENT_AMBULATORY_CARE_PROVIDER_SITE_OTHER): Payer: BLUE CROSS/BLUE SHIELD | Admitting: Family Medicine

## 2017-11-11 ENCOUNTER — Encounter: Payer: Self-pay | Admitting: Family Medicine

## 2017-11-11 VITALS — BP 100/69 | HR 92 | Wt 193.0 lb

## 2017-11-11 DIAGNOSIS — Z3480 Encounter for supervision of other normal pregnancy, unspecified trimester: Secondary | ICD-10-CM

## 2017-11-11 DIAGNOSIS — Z3483 Encounter for supervision of other normal pregnancy, third trimester: Secondary | ICD-10-CM

## 2017-11-11 LAB — OB RESULTS CONSOLE GBS: STREP GROUP B AG: NEGATIVE

## 2017-11-11 LAB — OB RESULTS CONSOLE GC/CHLAMYDIA: GC PROBE AMP, GENITAL: NEGATIVE

## 2017-11-11 NOTE — Patient Instructions (Signed)

## 2017-11-11 NOTE — Progress Notes (Signed)
   PRENATAL VISIT NOTE  Subjective:  Adrienne Griffin is a 27 y.o. G2P1001 at 32100w3d being seen today for ongoing prenatal care.  She is currently monitored for the following issues for this low-risk pregnancy and has Supervision of other normal pregnancy, antepartum on their problem list.  Patient reports no complaints.  Contractions: Irritability. Vag. Bleeding: None.  Movement: Present. Denies leaking of fluid.   The following portions of the patient's history were reviewed and updated as appropriate: allergies, current medications, past family history, past medical history, past social history, past surgical history and problem list. Problem list updated.  Objective:   Vitals:   11/11/17 0950  BP: 100/69  Pulse: 92  Weight: 193 lb (87.5 kg)    Fetal Status: Fetal Heart Rate (bpm): 150 Fundal Height: 34 cm Movement: Present  Presentation: Vertex  General:  Alert, oriented and cooperative. Patient is in no acute distress.  Skin: Skin is warm and dry. No rash noted.   Cardiovascular: Normal heart rate noted  Respiratory: Normal respiratory effort, no problems with respiration noted  Abdomen: Soft, gravid, appropriate for gestational age.  Pain/Pressure: Present     Pelvic: Cervical exam performed Dilation: Closed Effacement (%): 70 Station: -2  Extremities: Normal range of motion.  Edema: None  Mental Status: Normal mood and affect. Normal behavior. Normal judgment and thought content.   Assessment and Plan:  Pregnancy: G2P1001 at 68100w3d  1. Supervision of other normal pregnancy, antepartum Cultures today - Strep Gp B NAA - Cervicovaginal ancillary only  Preterm labor symptoms and general obstetric precautions including but not limited to vaginal bleeding, contractions, leaking of fluid and fetal movement were reviewed in detail with the patient. Please refer to After Visit Summary for other counseling recommendations.  Return in 1 week (on 11/18/2017).  Future  Appointments  Date Time Provider Department Center  11/18/2017  9:15 AM Allie Bossierove, Myra C, MD [facility]  11/25/2017 10:00 AM Anyanwu, Jethro BastosUgonna A, MD [facility]  12/02/2017 10:00 AM [place] BingPickens, Charlie, MD [facility]    Reva Boresanya S Promiss Labarbera, MD

## 2017-11-12 LAB — CERVICOVAGINAL ANCILLARY ONLY
Chlamydia: NEGATIVE
Neisseria Gonorrhea: NEGATIVE

## 2017-11-13 LAB — STREP GP B NAA: STREP GROUP B AG: NEGATIVE

## 2017-11-18 ENCOUNTER — Encounter: Payer: Self-pay | Admitting: Obstetrics & Gynecology

## 2017-11-18 ENCOUNTER — Ambulatory Visit (INDEPENDENT_AMBULATORY_CARE_PROVIDER_SITE_OTHER): Payer: BLUE CROSS/BLUE SHIELD | Admitting: Obstetrics & Gynecology

## 2017-11-18 VITALS — BP 96/67 | HR 78 | Wt 194.2 lb

## 2017-11-18 DIAGNOSIS — Z3480 Encounter for supervision of other normal pregnancy, unspecified trimester: Secondary | ICD-10-CM

## 2017-11-25 ENCOUNTER — Ambulatory Visit (INDEPENDENT_AMBULATORY_CARE_PROVIDER_SITE_OTHER): Payer: BLUE CROSS/BLUE SHIELD | Admitting: Obstetrics & Gynecology

## 2017-11-25 VITALS — BP 107/79 | HR 72 | Wt 195.0 lb

## 2017-11-25 DIAGNOSIS — Z3480 Encounter for supervision of other normal pregnancy, unspecified trimester: Secondary | ICD-10-CM

## 2017-11-25 DIAGNOSIS — Z3483 Encounter for supervision of other normal pregnancy, third trimester: Secondary | ICD-10-CM

## 2017-11-25 NOTE — Progress Notes (Signed)
   PRENATAL VISIT NOTE  Subjective:  Adrienne Griffin is a 27 y.o. G2P1001 at [redacted]w[redacted]d being seen today for ongoing prenatal care.  She is currently monitored for the following issues for this low-risk pregnancy and has Supervision of other normal pregnancy, antepartum on their problem list.  Patient reports occasional contractions.  Contractions: Irritability. Vag. Bleeding: None.  Movement: Present. Denies leaking of fluid.   The following portions of the patient's history were reviewed and updated as appropriate: allergies, current medications, past family history, past medical history, past social history, past surgical history and problem list. Problem list updated.  Objective:   Vitals:   11/25/17 1008  BP: 107/79  Pulse: 72  Weight: 195 lb (88.5 kg)    Fetal Status: Fetal Heart Rate (bpm): 151lbs   Movement: Present  Presentation: Vertex  General:  Alert, oriented and cooperative. Patient is in no acute distress.  Skin: Skin is warm and dry. No rash noted.   Cardiovascular: Normal heart rate noted  Respiratory: Normal respiratory effort, no problems with respiration noted  Abdomen: Soft, gravid, appropriate for gestational age.  Pain/Pressure: Present     Pelvic: Cervical exam performed Dilation: 1.5 Effacement (%): 70 Station: -2  Extremities: Normal range of motion.  Edema: None  Mental Status: Normal mood and affect. Normal behavior. Normal judgment and thought content.   Assessment and Plan:  Pregnancy: G2P1001 at [redacted]w[redacted]d  1. Supervision of other normal pregnancy, antepartum Term labor symptoms and general obstetric precautions including but not limited to vaginal bleeding, contractions, leaking of fluid and fetal movement were reviewed in detail with the patient. Please refer to After Visit Summary for other counseling recommendations.  Return in about 1 week (around 12/02/2017) for OB Visit.  Future Appointments  Date Time Provider Department Center  12/02/2017 10:00  AM [place] Bing, MD [facility]    Jaynie Collins, MD

## 2017-11-25 NOTE — Patient Instructions (Signed)
Return to clinic for any scheduled appointments or obstetric concerns, or go to [facility] for evaluation  

## 2017-12-02 ENCOUNTER — Ambulatory Visit (INDEPENDENT_AMBULATORY_CARE_PROVIDER_SITE_OTHER): Payer: BLUE CROSS/BLUE SHIELD | Admitting: Obstetrics and Gynecology

## 2017-12-02 VITALS — BP 109/69 | HR 72 | Wt 207.1 lb

## 2017-12-02 DIAGNOSIS — Z3483 Encounter for supervision of other normal pregnancy, third trimester: Secondary | ICD-10-CM

## 2017-12-02 DIAGNOSIS — Z3480 Encounter for supervision of other normal pregnancy, unspecified trimester: Secondary | ICD-10-CM

## 2017-12-02 NOTE — Progress Notes (Signed)
Prenatal Visit Note Date: 12/02/2017 Clinic: [facility]  Subjective:  Adrienne Griffin is a 27 y.o. G2P1001 at [redacted]w[redacted]d being seen today for ongoing prenatal care.  She is currently monitored for the following issues for this low-risk pregnancy and has Supervision of other normal pregnancy, antepartum on their problem list.  Patient reports no complaints.   Contractions: Irritability. Vag. Bleeding: None.  Movement: Present. Denies leaking of fluid.   The following portions of the patient's history were reviewed and updated as appropriate: allergies, current medications, past family history, past medical history, past social history, past surgical history and problem list. Problem list updated.  Objective:   Vitals:   12/02/17 0919  BP: 109/69  Pulse: 72  Weight: 207 lb 1.6 oz (93.9 kg)    Fetal Status: Fetal Heart Rate (bpm): 148 Fundal Height: 39 cm Movement: Present  Presentation: Vertex  General:  Alert, oriented and cooperative. Patient is in no acute distress.  Skin: Skin is warm and dry. No rash noted.   Cardiovascular: Normal heart rate noted  Respiratory: Normal respiratory effort, no problems with respiration noted  Abdomen: Soft, gravid, appropriate for gestational age. Pain/Pressure: Present     Pelvic:  Cervical exam performed Dilation: 2 Effacement (%): 50 Station: -2  Extremities: Normal range of motion.  Edema: None  Mental Status: Normal mood and affect. Normal behavior. Normal judgment and thought content.   Urinalysis:      Assessment and Plan:  Pregnancy: G2P1001 at [redacted]w[redacted]d  1. Supervision of other normal pregnancy, antepartum Routine care. Set up iol nv  Term labor symptoms and general obstetric precautions including but not limited to vaginal bleeding, contractions, leaking of fluid and fetal movement were reviewed in detail with the patient. Please refer to After Visit Summary for other counseling recommendations.  Return in about 1  week (around 12/09/2017) for nst/afi/rob.   [place] Bing, MD

## 2017-12-03 ENCOUNTER — Inpatient Hospital Stay (HOSPITAL_COMMUNITY)
Admission: AD | Admit: 2017-12-03 | Discharge: 2017-12-03 | Disposition: A | Discharge status: Home / Self Care | Payer: BLUE CROSS/BLUE SHIELD | Source: Ambulatory Visit | Attending: Obstetrics & Gynecology | Admitting: Obstetrics & Gynecology

## 2017-12-03 ENCOUNTER — Encounter (HOSPITAL_COMMUNITY): Payer: Self-pay | Admitting: General Practice

## 2017-12-03 ENCOUNTER — Inpatient Hospital Stay (HOSPITAL_COMMUNITY)
Admission: AD | Admit: 2017-12-03 | Discharge: 2017-12-05 | DRG: 807 | Disposition: A | Discharge status: Home / Self Care | Payer: BLUE CROSS/BLUE SHIELD | Source: Ambulatory Visit | Attending: Obstetrics and Gynecology | Admitting: Obstetrics and Gynecology

## 2017-12-03 ENCOUNTER — Encounter (HOSPITAL_COMMUNITY): Payer: Self-pay | Admitting: *Deleted

## 2017-12-03 ENCOUNTER — Other Ambulatory Visit: Payer: Self-pay

## 2017-12-03 ENCOUNTER — Inpatient Hospital Stay (HOSPITAL_COMMUNITY): Payer: BLUE CROSS/BLUE SHIELD | Admitting: Anesthesiology

## 2017-12-03 DIAGNOSIS — Z3A39 39 weeks gestation of pregnancy: Secondary | ICD-10-CM

## 2017-12-03 DIAGNOSIS — O479 False labor, unspecified: Secondary | ICD-10-CM

## 2017-12-03 DIAGNOSIS — Z3480 Encounter for supervision of other normal pregnancy, unspecified trimester: Secondary | ICD-10-CM

## 2017-12-03 DIAGNOSIS — Z3483 Encounter for supervision of other normal pregnancy, third trimester: Secondary | ICD-10-CM | POA: Diagnosis present

## 2017-12-03 LAB — CBC
HCT: 33.6 % — ABNORMAL LOW (ref 36.0–46.0)
Hemoglobin: 10.9 g/dL — ABNORMAL LOW (ref 12.0–15.0)
MCH: 26.8 pg (ref 26.0–34.0)
MCHC: 32.4 g/dL (ref 30.0–36.0)
MCV: 82.6 fL (ref 78.0–100.0)
PLATELETS: 264 10*3/uL (ref 150–400)
RBC: 4.07 MIL/uL (ref 3.87–5.11)
RDW: 15.2 % (ref 11.5–15.5)
WBC: 9.4 10*3/uL (ref 4.0–10.5)

## 2017-12-03 LAB — TYPE AND SCREEN
ABO/RH(D): O POS
Antibody Screen: NEGATIVE

## 2017-12-03 MED ORDER — ZOLPIDEM TARTRATE 5 MG PO TABS: 5.0000 mg | ORAL_TABLET | Freq: Every evening | ORAL | Status: DC | PRN

## 2017-12-03 MED ORDER — ACETAMINOPHEN 325 MG PO TABS: 650.0000 mg | ORAL_TABLET | ORAL | Status: DC | PRN

## 2017-12-03 MED ORDER — LIDOCAINE HCL (PF) 1 % IJ SOLN
INTRAMUSCULAR | Status: DC | PRN
  Administered 2017-12-03 (×2): 5 mL via EPIDURAL

## 2017-12-03 MED ORDER — ONDANSETRON HCL 4 MG/2ML IJ SOLN: 4.0000 mg | INTRAMUSCULAR | Status: DC | PRN

## 2017-12-03 MED ORDER — LACTATED RINGERS IV SOLN: 500.0000 mL | INTRAVENOUS | Status: DC | PRN

## 2017-12-03 MED ORDER — OXYCODONE HCL 5 MG PO TABS: 5.0000 mg | ORAL_TABLET | ORAL | Status: DC | PRN

## 2017-12-03 MED ORDER — SIMETHICONE 80 MG PO CHEW: 80.0000 mg | CHEWABLE_TABLET | ORAL | Status: DC | PRN

## 2017-12-03 MED ORDER — PHENYLEPHRINE 40 MCG/ML (10ML) SYRINGE FOR IV PUSH (FOR BLOOD PRESSURE SUPPORT)
80.0000 ug | PREFILLED_SYRINGE | INTRAVENOUS | Status: DC | PRN
  Filled 2017-12-03: qty 5

## 2017-12-03 MED ORDER — OXYTOCIN 40 UNITS IN LACTATED RINGERS INFUSION - SIMPLE MED
2.5000 [IU]/h | INTRAVENOUS | Status: DC
  Filled 2017-12-03: qty 1000

## 2017-12-03 MED ORDER — OXYCODONE-ACETAMINOPHEN 5-325 MG PO TABS: 2.0000 | ORAL_TABLET | ORAL | Status: DC | PRN

## 2017-12-03 MED ORDER — ONDANSETRON HCL 4 MG/2ML IJ SOLN: 4.0000 mg | Freq: Four times a day (QID) | INTRAMUSCULAR | Status: DC | PRN

## 2017-12-03 MED ORDER — SENNOSIDES-DOCUSATE SODIUM 8.6-50 MG PO TABS
2.0000 | ORAL_TABLET | ORAL | Status: DC
  Administered 2017-12-04: 2 via ORAL
  Filled 2017-12-03 (×2): qty 2

## 2017-12-03 MED ORDER — OXYCODONE-ACETAMINOPHEN 5-325 MG PO TABS: 1.0000 | ORAL_TABLET | ORAL | Status: DC | PRN

## 2017-12-03 MED ORDER — DIPHENHYDRAMINE HCL 50 MG/ML IJ SOLN: 12.5000 mg | INTRAMUSCULAR | Status: DC | PRN

## 2017-12-03 MED ORDER — COCONUT OIL OIL: 1.0000 "application " | TOPICAL_OIL | Status: DC | PRN

## 2017-12-03 MED ORDER — ONDANSETRON HCL 4 MG PO TABS
4.0000 mg | ORAL_TABLET | ORAL | Status: DC | PRN
Start: 2017-12-03 — End: 2017-12-05

## 2017-12-03 MED ORDER — DIPHENHYDRAMINE HCL 25 MG PO CAPS: 25.0000 mg | ORAL_CAPSULE | Freq: Four times a day (QID) | ORAL | Status: DC | PRN

## 2017-12-03 MED ORDER — SOD CITRATE-CITRIC ACID 500-334 MG/5ML PO SOLN: 30.0000 mL | ORAL | Status: DC | PRN

## 2017-12-03 MED ORDER — IBUPROFEN 600 MG PO TABS
600.0000 mg | ORAL_TABLET | Freq: Four times a day (QID) | ORAL | Status: DC
  Administered 2017-12-04 (×4): 600 mg via ORAL
  Filled 2017-12-03 (×6): qty 1

## 2017-12-03 MED ORDER — FENTANYL 2.5 MCG/ML BUPIVACAINE 1/10 % EPIDURAL INFUSION (WH - ANES)
14.0000 mL/h | INTRAMUSCULAR | Status: DC | PRN
  Administered 2017-12-03: 14 mL/h via EPIDURAL
  Filled 2017-12-03: qty 100

## 2017-12-03 MED ORDER — WITCH HAZEL-GLYCERIN EX PADS: 1.0000 "application " | MEDICATED_PAD | CUTANEOUS | Status: DC | PRN

## 2017-12-03 MED ORDER — LIDOCAINE HCL (PF) 1 % IJ SOLN
30.0000 mL | INTRAMUSCULAR | Status: DC | PRN
  Filled 2017-12-03: qty 30

## 2017-12-03 MED ORDER — EPHEDRINE 5 MG/ML INJ
10.0000 mg | INTRAVENOUS | Status: DC | PRN
  Filled 2017-12-03: qty 2

## 2017-12-03 MED ORDER — BENZOCAINE-MENTHOL 20-0.5 % EX AERO: 1.0000 "application " | INHALATION_SPRAY | CUTANEOUS | Status: DC | PRN

## 2017-12-03 MED ORDER — LACTATED RINGERS IV SOLN
500.0000 mL | Freq: Once | INTRAVENOUS | Status: AC
  Administered 2017-12-03: 500 mL via INTRAVENOUS

## 2017-12-03 MED ORDER — TETANUS-DIPHTH-ACELL PERTUSSIS 5-2.5-18.5 LF-MCG/0.5 IM SUSP: 0.5000 mL | Freq: Once | INTRAMUSCULAR | Status: DC

## 2017-12-03 MED ORDER — PHENYLEPHRINE 40 MCG/ML (10ML) SYRINGE FOR IV PUSH (FOR BLOOD PRESSURE SUPPORT)
80.0000 ug | PREFILLED_SYRINGE | INTRAVENOUS | Status: DC | PRN
  Filled 2017-12-03: qty 5
  Filled 2017-12-03: qty 10

## 2017-12-03 MED ORDER — LACTATED RINGERS IV SOLN
INTRAVENOUS | Status: DC
  Administered 2017-12-03: 17:00:00 via INTRAVENOUS

## 2017-12-03 MED ORDER — DIBUCAINE 1 % RE OINT: 1.0000 "application " | TOPICAL_OINTMENT | RECTAL | Status: DC | PRN

## 2017-12-03 MED ORDER — PRENATAL MULTIVITAMIN CH
1.0000 | ORAL_TABLET | Freq: Every day | ORAL | Status: DC
  Administered 2017-12-04: 1 via ORAL
  Filled 2017-12-03: qty 1

## 2017-12-03 MED ORDER — OXYTOCIN BOLUS FROM INFUSION
500.0000 mL | Freq: Once | INTRAVENOUS | Status: AC
  Administered 2017-12-03: 500 mL via INTRAVENOUS

## 2017-12-03 NOTE — Anesthesia Procedure Notes (Signed)
Epidural Patient location during procedure: OB  Staffing Anesthesiologist: Layah Skousen, MD Performed: anesthesiologist   Preanesthetic Checklist Completed: patient identified, site marked, surgical consent, pre-op evaluation, timeout performed, IV checked, risks and benefits discussed and monitors and equipment checked  Epidural Patient position: sitting Prep: DuraPrep Patient monitoring: heart rate, continuous pulse ox and blood pressure Approach: right paramedian Location: L3-L4 Injection technique: LOR saline  Needle:  Needle type: Tuohy  Needle gauge: 17 G Needle length: 9 cm and 9 Needle insertion depth: 7 cm Catheter type: closed end flexible Catheter size: 20 Guage Catheter at skin depth: 11 cm Test dose: negative  Assessment Events: blood not aspirated, injection not painful, no injection resistance, negative IV test and no paresthesia  Additional Notes Patient identified. Risks/Benefits/Options discussed with patient including but not limited to bleeding, infection, nerve damage, paralysis, failed block, incomplete pain control, headache, blood pressure changes, nausea, vomiting, reactions to medication both or allergic, itching and postpartum back pain. Confirmed with bedside nurse the patient's most recent platelet count. Confirmed with patient that they are not currently taking any anticoagulation, have any bleeding history or any family history of bleeding disorders. Patient expressed understanding and wished to proceed. All questions were answered. Sterile technique was used throughout the entire procedure. Please see nursing notes for vital signs. Test dose was given through epidural needle and negative prior to continuing to dose epidural or start infusion. Warning signs of high block given to the patient including shortness of breath, tingling/numbness in hands, complete motor block, or any concerning symptoms with instructions to call for help. Patient was given  instructions on fall risk and not to get out of bed. All questions and concerns addressed with instructions to call with any issues.     

## 2017-12-03 NOTE — Discharge Instructions (Signed)
Braxton Hicks Contractions °Contractions of the uterus can occur throughout pregnancy, but they are not always a sign that you are in labor. You may have practice contractions called Braxton Hicks contractions. These false labor contractions are sometimes confused with true labor. °What are Braxton Hicks contractions? °Braxton Hicks contractions are tightening movements that occur in the muscles of the uterus before labor. Unlike true labor contractions, these contractions do not result in opening (dilation) and thinning of the cervix. Toward the end of pregnancy (32-34 weeks), Braxton Hicks contractions can happen more often and may become stronger. These contractions are sometimes difficult to tell apart from true labor because they can be very uncomfortable. You should not feel embarrassed if you go to the hospital with false labor. °Sometimes, the only way to tell if you are in true labor is for your health care provider to look for changes in the cervix. The health care provider will do a physical exam and may monitor your contractions. If you are not in true labor, the exam should show that your cervix is not dilating and your water has not broken. °If there are other health problems associated with your pregnancy, it is completely safe for you to be sent home with false labor. You may continue to have Braxton Hicks contractions until you go into true labor. °How to tell the difference between true labor and false labor °True labor °· Contractions last 30-70 seconds. °· Contractions become very regular. °· Discomfort is usually felt in the top of the uterus, and it spreads to the lower abdomen and low back. °· Contractions do not go away with walking. °· Contractions usually become more intense and increase in frequency. °· The cervix dilates and gets thinner. °False labor °· Contractions are usually shorter and not as strong as true labor contractions. °· Contractions are usually irregular. °· Contractions  are often felt in the front of the lower abdomen and in the groin. °· Contractions may go away when you walk around or change positions while lying down. °· Contractions get weaker and are shorter-lasting as time goes on. °· The cervix usually does not dilate or become thin. °Follow these instructions at home: °· Take over-the-counter and prescription medicines only as told by your health care provider. °· Keep up with your usual exercises and follow other instructions from your health care provider. °· Eat and drink lightly if you think you are going into labor. °· If Braxton Hicks contractions are making you uncomfortable: °? Change your position from lying down or resting to walking, or change from walking to resting. °? Sit and rest in a tub of warm water. °? Drink enough fluid to keep your urine pale yellow. Dehydration may cause these contractions. °? Do slow and deep breathing several times an hour. °· Keep all follow-up prenatal visits as told by your health care provider. This is important. °Contact a health care provider if: °· You have a fever. °· You have continuous pain in your abdomen. °Get help right away if: °· Your contractions become stronger, more regular, and closer together. °· You have fluid leaking or gushing from your vagina. °· You pass blood-tinged mucus (bloody show). °· You have bleeding from your vagina. °· You have low back pain that you never had before. °· You feel your baby’s head pushing down and causing pelvic pressure. °· Your baby is not moving inside you as much as it used to. °Summary °· Contractions that occur before labor are called Braxton   Hicks contractions, false labor, or practice contractions. °· Braxton Hicks contractions are usually shorter, weaker, farther apart, and less regular than true labor contractions. True labor contractions usually become progressively stronger and regular and they become more frequent. °· Manage discomfort from Braxton Hicks contractions by  changing position, resting in a warm bath, drinking plenty of water, or practicing deep breathing. °This information is not intended to replace advice given to you by your health care provider. Make sure you discuss any questions you have with your health care provider. °Document Released: 11/26/2016 Document Revised: 11/26/2016 Document Reviewed: 11/26/2016 °Elsevier Interactive Patient Education © 2018 [employer] ° °

## 2017-12-03 NOTE — H&P (Addendum)
OBSTETRIC ADMISSION HISTORY AND PHYSICAL  Adrienne Griffin is a 27 y.o. female G2P1001 with IUP at [redacted]w[redacted]d by LMP presenting for SOL. She reports +FMs, No LOF, no VB, no blurry vision, headaches or peripheral edema, and RUQ pain.  She plans on breast and bottle feeding. She is undecided about birth control. She received her prenatal care at [facility]   Dating: By lmp --->  Estimated Date of Delivery: 12/06/17  Sono:    , CWD, normal anatomy, cephalic presentation, anterior placenta, 837g, 74% EFW   Prenatal History/Complications:  Past Medical History: Past Medical History:  Diagnosis Date  . Medical history non-contributory   . Supervision of other normal pregnancy, antepartum 04/23/2017    Clinic [facility] Prenatal Labs Dating LMP = 7 wk u/s Blood type: O/Positive/-- (09/28 1219)  Genetic Screen Quad: neg      Antibody:Negative (09/28 1219) Anatomic [place] Normal Rubella:  Immune GTT Third trimester: Normal 2 hr GTT - 80/163/108 RPR: Non Reactive (09/28 1219)  Flu vaccine 09/10/17 HBsAg: Negative (09/28 1219)  TDaP vaccine 09/10/2017 HIV:   Non-reactive Baby Food  bottle & breast               Past Surgical History: Past Surgical History:  Procedure Laterality Date  . NO PAST SURGERIES    . wisdom tooth removal  2010    Obstetrical History: OB History    Gravida  2   Para  1   Term  1   Preterm      AB      Living  1     SAB      TAB      Ectopic      Multiple  0   Live Births  1           Social History: Social History   Socioeconomic History  . Marital status: Married    Spouse name: Not on file  . Number of children: Not on file  . Years of education: Not on file  . Highest education level: Not on file  Occupational History  . Not on file  Social Needs  . Financial resource strain: Not on file  . Food insecurity:    Worry: Not on file    Inability: Not on file  . Transportation needs:    Medical: Not on file    Non-medical: Not on file   Tobacco Use  . Smoking status: Never Smoker  . Smokeless tobacco: Never Used  Substance and Sexual Activity  . Alcohol use: Yes    Comment: occasional pre-pregnancy  . Drug use: No  . Sexual activity: Yes    Partners: Male    Birth control/protection: None  Lifestyle  . Physical activity:    Days per week: Not on file    Minutes per session: Not on file  . Stress: Not on file  Relationships  . Social connections:    Talks on phone: Not on file    Gets together: Not on file    Attends religious service: Not on file    Active member of club or organization: Not on file    Attends meetings of clubs or organizations: Not on file    Relationship status: Not on file  Other Topics Concern  . Not on file  Social History Narrative  . Not on file    Family History: Family History  Problem Relation Age of Onset  . Hypertension Mother   . Diabetes Mother  Allergies: No Known Allergies  Medications Prior to Admission  Medication Sig Dispense Refill Last Dose  . Prenatal Vit-Fe Fumarate-FA (MULTIVITAMIN-PRENATAL) 27-0.8 MG TABS tablet Take 1 tablet by mouth daily at 12 noon.   12/02/2017 at Unknown time     Review of Systems   All systems reviewed and negative except as stated in HPI  Temperature 99.1 F (37.3 C), last menstrual period 03/01/2017, unknown if currently breastfeeding. General appearance: alert and cooperative Lungs: clear to auscultation bilaterally Heart: regular rate and rhythm Abdomen: soft, non-tender; bowel sounds normal Extremities: Homans sign is negative, no sign of DVT Presentation: cephalic Fetal monitoringBaseline: 150 bpm, Variability: Good {> 6 bpm), Accelerations: Reactive and Decelerations: Absent Uterine activityFrequency: Every 4-5 minutes Dilation: 5 Effacement (%): 80 Station: -2 Exam by:: Polos RN   Prenatal labs: ABO, Rh: O/Positive/-- (09/28 1219) Antibody: Negative (09/28 1219) Rubella: 1.43 (09/28 1219) RPR: Non Reactive  (02/15 0820)  HBsAg: Negative (09/28 1219)  HIV: Non Reactive (02/15 0820)  GBS: Negative (04/18 0945)  Glucose: 163/80/108 Genetic screening  normal Anatomy US normal  Prenatal Transfer Tool  Maternal Diabetes: No Genetic Screening: Normal Maternal Ultrasounds/Referrals: Normal Fetal Ultrasounds or other Referrals:  None Maternal Substance Abuse:  No Significant Maternal Medications:  None Significant Maternal Lab Results: None  No results found for this or any previous visit (from the past 24 hour(s)).  Patient Active Problem List   Diagnosis Date Noted  . Normal labor 12/03/2017  . Supervision of other normal pregnancy, antepartum 04/23/2017    Assessment/Plan:  Adrienne Griffin is a 27 y.o. G2P1001 at [redacted]w[redacted]d here for SOL. Has had a prenatal course without any abnormalities per chart. GBS negative. Progressed from 1.5 to 5 from early morning to admission. Will proceed with expectant management for now, likely augment with pitocin and possible AROM if appropriate. Epidural for pain control.  #Labor:expectant, likely augment with pitocin/AROM if change slows down #Pain: epidural #FWB: Cat 1 #ID:  N/A #MOF: breast and bottle #MOC: undecided #Circ:  Yes  Myrene Buddy, MD  12/03/2017, 4:42 PM   Midwife attestation: I have seen and examined this patient; I agree with above documentation in the resident's note.   Adrienne Griffin is a 27 y.o. G2P1001 here for active labor.  PE: BP 125/81   Pulse 82   Temp 98.2 F (36.8 C) (Axillary)   Resp 18   Ht  (1.499 m)   Wt 196 lb (88.9 kg)   LMP 03/01/2017   SpO2 98%   BMI 39.59 kg/m  Gen: calm comfortable, NAD Resp: normal effort, no distress Abd: gravid  ROS, labs, PMH reviewed  Plan: Admit to LD Labor: expectant management FWB: FHR baseline 135, moderate variability, positive accels,  No decels ID: GBS negative  Sharen Counter, CNM  12/03/2017, 8:49 PM

## 2017-12-03 NOTE — MAU Note (Signed)
G2P1 at 39.4 week.  Sent home this am for early labor, pt now 5cm.  No LOF or VB.  Pt reports good FM.  BMZ at 36 weeks.  No other concerns.

## 2017-12-03 NOTE — Progress Notes (Signed)
   12/03/17 0654 12/03/17 0725  Fetal Heart Rate A  Mode External External  Baseline Rate (A) 150 bpm 150 bpm  Variability 6-25 BPM 6-25 BPM  Accelerations 15 x 15 15 x 15  Decelerations Variable (PT WAS TRYING TO SIT UP - TURNED MORE TO LEFT SIDE - ) None  Multiple birth? N  --   Uterine Activity  Mode  --  Toco  Contraction Frequency (min)  --  5-8  Contraction Duration (sec)  --  60-110  Contraction Quality  --  Mild  Resting Tone Palpated  --  Relaxed  Resting Time  --  Adequate  Cervical Exam  Dilation  --  3  Effacement (%)  --  60  Cervical Position  --  Posterior  Cervical Consistency  --  Soft  Vag. Bleeding  --  None  Station  --  -3  Presentation  --  Vertex  Exam by:  --  Claudette Laws  Report given to Dr Doroteo Glassman re: above assessment to include reactive FHR & variable with mod variability between. MD to view strip & d/c home with labor precautions.

## 2017-12-03 NOTE — Progress Notes (Signed)
Subjective: Doing well, feeling urge to push. Pushed well with couple of test pushes.   Objective: BP 125/81   Pulse 82   Temp 98.2 F (36.8 C) (Axillary)   Resp 18   Ht  (1.499 m)   Wt 88.9 kg (196 lb)   LMP 03/01/2017   SpO2 98%   BMI 39.59 kg/m  No intake/output data recorded. No intake/output data recorded.  FHT:  FHR: 140 bpm, variability: moderate,  accelerations:  Present,  decelerations:  decels with pushes UC:   regular, every 2-3 minutes SVE:   Dilation: 10 Effacement (%): 100 Station: -1 Exam by:: Dr Primitivo Gauze  Labs: Lab Results  Component Value Date   WBC 9.4 12/03/2017   HGB 10.9 (L) 12/03/2017   HCT 33.6 (L) 12/03/2017   MCV 82.6 12/03/2017   PLT 264 12/03/2017    Assessment / Plan: Spontaneous labor, progressing normally. Now complete. AROM @ 1940. Delivery imminent  Labor: Progressing normally, delivery imminent Preeclampsia:  N/A Fetal Wellbeing:  Category I Pain Control:  Epidural I/D:  n/a Anticipated MOD:  NSVD  Myrene Buddy 12/03/2017, 7:46 PM

## 2017-12-03 NOTE — MAU Note (Signed)
SAYS UC STRONG  SINCE 0400-   PNC WITH [facility]- OFFICE .   DENIES HSV AND MRSA.Marland Kitchen   GBS- NEG

## 2017-12-03 NOTE — Progress Notes (Signed)
I have communicated with Dr Macon Large   Vaginal exam:  Dilation: 3 Effacement (%): 60 Cervical Position: Posterior Station: -3 Presentation: Vertex Exam by:: Claudette Laws,   Also reviewed contraction pattern and that non-stress test is reactive.  It has been documented that patient has reactive FHR with cervical change over 1 hours not indicating active labor.  Patient denies any other complaints.  Based on this report provider has given order for discharge & reviewing strip.  A discharge order and diagnosis entered by a provider.   Labor discharge instructions reviewed with patient.

## 2017-12-03 NOTE — Anesthesia Preprocedure Evaluation (Signed)

## 2017-12-03 NOTE — MAU Note (Signed)
Pt reports contractions every 5-10 mins since 2:15am. Pt denies LOF or vaginal bleeding. Reports good fetal movement. Cervix was 1.5cm yesterday

## 2017-12-04 LAB — RPR: RPR: NONREACTIVE

## 2017-12-04 NOTE — Anesthesia Postprocedure Evaluation (Signed)
Anesthesia Post Note  Patient: [name]  Procedure(s) Performed: AN AD HOC LABOR EPIDURAL     Patient location during evaluation: Mother Baby Anesthesia Type: Epidural Level of consciousness: awake and alert and oriented Pain management: satisfactory to patient Vital Signs Assessment: post-procedure vital signs reviewed and stable Respiratory status: spontaneous breathing and nonlabored ventilation Cardiovascular status: stable Postop Assessment: no headache, no backache, no signs of nausea or vomiting, adequate PO intake and patient able to bend at knees (patient up walking) Anesthetic complications: no    Last Vitals:  Vitals:   12/03/17 2310 12/04/17 0302  BP: 113/70 102/64  Pulse: 82 67  Resp: 16 16  Temp: 37.1 C 36.9 C  SpO2:      Last Pain:  Vitals:   12/04/17 0547  TempSrc:   PainSc: 0-No pain   Pain Goal:                 Madison Hickman

## 2017-12-04 NOTE — Progress Notes (Signed)
Post Partum Day #1 Subjective: no complaints, up ad lib and tolerating PO; breast and bottle feeding; unsure re contraception  Objective: Blood pressure 102/64, pulse 67, temperature 98.4 F (36.9 C), temperature source Oral, resp. rate 16, height  (1.499 m), weight 88.9 kg (196 lb), last menstrual period 03/01/2017, SpO2 98 %, unknown if currently breastfeeding.  Physical Exam:  General: alert and cooperative Lochia: appropriate Uterine Fundus: firm DVT Evaluation: No evidence of DVT seen on physical exam.  Recent Labs    12/03/17 1646  HGB 10.9*  HCT 33.6*    Assessment/Plan: Plan for discharge tomorrow and Circumcision prior to discharge   LOS: 1 day   Cam Hai CNM 12/04/2017, 9:27 AM

## 2017-12-04 NOTE — Lactation Note (Signed)
This note was copied from a baby's chart. Lactation Consultation Note  Patient Name: Boy Hermina Barnard [id number] Date: 12/04/2017 Reason for consult: Initial assessment;Term;1st time breastfeeding  4 hours old FT female who is being exclusively BF by his mother, she's a P2 but doesn't have any experience BF, she didn't BF her first child. RN called for Lactation assistance because mother had insecurity issues about her milk supply. Baby was asleep and swaddled in mom's arms when entering the room, and the room was full of visitors at midnight. Asked mom if she wished to have her visitors wait outside while doing [facility] consult, she agreed.  Offered assistance with latch but mom declined saying that baby already fed but she wanted to know if she already had "milk" in her breasts. Showed mom how to hand express and she was able to get multiple droplets of colostrum coming out, that gave her a great amount of reassurance since she wasn't able to express any on her own, taught the technique of "press, compress and relax" from Laverle Hobby.   Per mom feedings at the breast are comfortable and both of her nipples looked intact upon examination with no signs of trauma. Asked mom to call for latch assistance the next time baby is ready to feed. She doesn't have a pump at home, offered a hand pump from hospital but she declined. She voiced she plans to supplement with formula at some point, that was her feeding choice upon admission.   Encouraged mom to feed baby STS 8-12 times/24 hours or sooner if feeding cues are present. Discussed introducing formula once BF is well establish at 3-4 weeks. Reviewed BF brochure, BF resources and feeding diary, mom is aware of LC services and will call PRN.  Maternal Data Formula Feeding for Exclusion: Yes Reason for exclusion: Mother's choice to formula and breast feed on admission Has patient been taught Hand Expression?: Yes Does the patient have breastfeeding experience prior  to this delivery?: No  Feeding Feeding Type: Breast Fed Length of feed: 10 min  LATCH Score Latch: Grasps breast easily, tongue down, lips flanged, rhythmical sucking.  Audible Swallowing: None  Type of Nipple: Everted at rest and after stimulation  Comfort (Breast/Nipple): Soft / non-tender  Hold (Positioning): Assistance needed to correctly position infant at breast and maintain latch.  LATCH Score: 7  Interventions Interventions: Breast feeding basics reviewed;Hand express;Breast compression;Breast massage;Expressed milk  Lactation Tools Discussed/Used WIC Program: No   Consult Status Consult Status: Follow-up Date: 12/04/17 Follow-up type: In-patient    Francess Mullen Venetia Constable 12/04/2017, 12:59 AM

## 2017-12-05 MED ORDER — IBUPROFEN 600 MG PO TABS: 600.0000 mg | ORAL_TABLET | Freq: Four times a day (QID) | ORAL | 0 refills | Status: DC

## 2017-12-05 NOTE — Discharge Instructions (Signed)
Postpartum Care After Vaginal Delivery °The period of time right after you deliver your newborn is called the postpartum period. °What kind of medical care will I receive? °· You may continue to receive fluids and medicines through an IV tube inserted into one of your veins. °· If an incision was made near your vagina (episiotomy) or if you had some vaginal tearing during delivery, cold compresses may be placed on your episiotomy or your tear. This helps to reduce pain and swelling. °· You may be given a squirt bottle to use when you go to the bathroom. You may use this until you are comfortable wiping as usual. To use the squirt bottle, follow these steps: °? Before you urinate, fill the squirt bottle with warm water. Do not use hot water. °? After you urinate, while you are sitting on the toilet, use the squirt bottle to rinse the area around your urethra and vaginal opening. This rinses away any urine and blood. °? You may do this instead of wiping. As you start healing, you may use the squirt bottle before wiping yourself. Make sure to wipe gently. °? Fill the squirt bottle with clean water every time you use the bathroom. °· You will be given sanitary pads to wear. °How can I expect to feel? °· You may not feel the need to urinate for several hours after delivery. °· You will have some soreness and pain in your abdomen and vagina. °· If you are breastfeeding, you may have uterine contractions every time you breastfeed for up to several weeks postpartum. Uterine contractions help your uterus return to its normal size. °· It is normal to have vaginal bleeding (lochia) after delivery. The amount and appearance of lochia is often similar to a menstrual period in the first week after delivery. It will gradually decrease over the next few weeks to a dry, yellow-brown discharge. For most women, lochia stops completely by 6-8 weeks after delivery. Vaginal bleeding can vary from woman to woman. °· Within the first few  days after delivery, you may have breast engorgement. This is when your breasts feel heavy, full, and uncomfortable. Your breasts may also throb and feel hard, tightly stretched, warm, and tender. After this occurs, you may have milk leaking from your breasts. Your health care provider can help you relieve discomfort due to breast engorgement. Breast engorgement should go away within a few days. °· You may feel more sad or worried than normal due to hormonal changes after delivery. These feelings should not last more than a few days. If these feelings do not go away after several days, speak with your health care provider. °How should I care for myself? °· Tell your health care provider if you have pain or discomfort. °· Drink enough water to keep your urine clear or pale yellow. °· Wash your hands thoroughly with soap and water for at least 20 seconds after changing your sanitary pads, after using the toilet, and before holding or feeding your baby. °· If you are not breastfeeding, avoid touching your breasts a lot. Doing this can make your breasts produce more milk. °· If you become weak or lightheaded, or you feel like you might faint, ask for help before: °? Getting out of bed. °? Showering. °· Change your sanitary pads frequently. Watch for any changes in your flow, such as a sudden increase in volume, a change in color, the passing of large blood clots. If you pass a blood clot from your vagina, save it   to show to your health care provider. Do not flush blood clots down the toilet without having your health care provider look at them. °· Make sure that all your vaccinations are up to date. This can help protect you and your baby from getting certain diseases. You may need to have immunizations done before you leave the hospital. °· If desired, talk with your health care provider about methods of family planning or birth control (contraception). °How can I start bonding with my baby? °Spending as much time as  possible with your baby is very important. During this time, you and your baby can get to know each other and develop a bond. Having your baby stay with you in your room (rooming in) can give you time to get to know your baby. Rooming in can also help you become comfortable caring for your baby. Breastfeeding can also help you bond with your baby. °How can I plan for returning home with my baby? °· Make sure that you have a car seat installed in your vehicle. °? Your car seat should be checked by a certified car seat installer to make sure that it is installed safely. °? Make sure that your baby fits into the car seat safely. °· Ask your health care provider any questions you have about caring for yourself or your baby. Make sure that you are able to contact your health care provider with any questions after leaving the hospital. °This information is not intended to replace advice given to you by your health care provider. Make sure you discuss any questions you have with your health care provider. °Document Released: 05/10/2007 Document Revised: 12/16/2015 Document Reviewed: 06/17/2015 °Elsevier Interactive Patient Education © 2018 Elsevier Inc. ° ° °Contraception Choices °Contraception, also called birth control, means things to use or ways to try not to get pregnant. °Hormonal birth control °This kind of birth control uses hormones. Here are some types of hormonal birth control: °· A tube that is put under skin of the arm (implant). The tube can stay in for as long as 3 years. °· Shots to get every 3 months (injections). °· Pills to take every day (birth control pills). °· A patch to change 1 time each week for 3 weeks (birth control patch). After that, the patch is taken off for 1 week. °· A ring to put in the vagina. The ring is left in for 3 weeks. Then it is taken out of the vagina for 1 week. Then a new ring is put in. °· Pills to take after unprotected sex (emergency birth control pills). ° °Barrier birth  control °Here are some types of barrier birth control: °· A thin covering that is put on the penis before sex (female condom). The covering is thrown away after sex. °· A soft, loose covering that is put in the vagina before sex (female condom). The covering is thrown away after sex. °· A rubber bowl that sits over the cervix (diaphragm). The bowl must be made for you. The bowl is put into the vagina before sex. The bowl is left in for 6-8 hours after sex. It is taken out within 24 hours. °· A small, soft cup that fits over the cervix (cervical cap). The cup must be made for you. The cup can be left in for 6-8 hours after sex. It is taken out within 48 hours. °· A sponge that is put into the vagina before sex. It must be left in for at least 6   hours after sex. It must be taken out within 30 hours. Then it is thrown away.  A chemical that kills or stops sperm from getting into the uterus (spermicide). It may be a pill, cream, jelly, or foam to put in the vagina. The chemical should be used at least 10-15 minutes before sex.  IUD (intrauterine) birth control An IUD is a small, T-shaped piece of plastic. It is put inside the uterus. There are two kinds:  Hormone IUD. This kind can stay in for 3-5 years.  Copper IUD. This kind can stay in for 10 years.  Permanent birth control Here are some types of permanent birth control:  Surgery to block the fallopian tubes.  Having an insert put into each fallopian tube.  Surgery to tie off the tubes that carry sperm (vasectomy).  Natural planning birth control Here are some types of natural planning birth control:  Not having sex on the days the woman could get pregnant.  Using a calendar: ? To keep track of the length of each period. ? To find out what days pregnancy can happen. ? To plan to not have sex on days when pregnancy can happen.  Watching for symptoms of ovulation and not having sex during ovulation. One way the woman can check for ovulation  is to check her temperature.  Waiting to have sex until after ovulation.  Summary  Contraception, also called birth control, means things to use or ways to try not to get pregnant.  Hormonal methods of birth control include implants, injections, pills, patches, vaginal rings, and emergency birth control pills.  Barrier methods of birth control can include female condoms, female condoms, diaphragms, cervical caps, sponges, and spermicides.  There are two types of IUD (intrauterine device) birth control. An IUD can be put in a woman's uterus to prevent pregnancy for 3-5 years.  Permanent sterilization can be done through a procedure for males, females, or both.  Natural planning methods involve not having sex on the days when the woman could get pregnant. This information is not intended to replace advice given to you by your health care provider. Make sure you discuss any questions you have with your health care provider. Document Released: 05/10/2009 Document Revised: 07/23/2016 Document Reviewed: 07/23/2016 Elsevier Interactive Patient Education  2017 [employer]

## 2017-12-05 NOTE — Lactation Note (Signed)
This note was copied from a baby's chart. Lactation Consultation Note;  Mother request assistance with latching infant. Infant latched on well with good depht. Mother reports that latch doesn't hurt. Infant sustained latch for 20 mins. Staff nurse to give mother a hand pump with instructions.  Discussed cluster feeding and cue base feeding. Mother to continue to breastfeed infant 8-12 times in 24 hours and to pump for 15 mins on each breast and offer infant her breastmilk instead of formula. Discussed treatment and prevention of engorgement.   Mother is aware of aviailable LC services.   Patient Name: Adrienne Griffin HQION'G Date: 12/05/2017 Reason for consult: Follow-up assessment   Maternal Data    Feeding Feeding Type: Breast Fed Length of feed: 20 min  LATCH Score Latch: Grasps breast easily, tongue down, lips flanged, rhythmical sucking.  Audible Swallowing: Spontaneous and intermittent  Type of Nipple: Everted at rest and after stimulation  Comfort (Breast/Nipple): Soft / non-tender  Hold (Positioning): Assistance needed to correctly position infant at breast and maintain latch.  LATCH Score: 9  Interventions    Lactation Tools Discussed/Used     Consult Status Consult Status: Complete    Michel Bickers 12/05/2017, 12:59 PM

## 2017-12-05 NOTE — Discharge Summary (Signed)
OB Discharge Summary     Patient Name: Adrienne Griffin DOB: 1991/01/16 MRN: [id number]  Date of admission: 12/03/2017 Delivering MD: Sharen Counter A   Date of discharge: 12/05/2017  Admitting diagnosis: 39 WKS CTX Intrauterine pregnancy: [redacted]w[redacted]d     Secondary diagnosis:  Principal Problem:   SVD (spontaneous vaginal delivery) Active Problems:   Supervision of other normal pregnancy, antepartum   Normal labor     Discharge diagnosis: Term Pregnancy Delivered                                                                                                Post partum procedures:none  Augmentation: AROM  Complications: None  Hospital course:  Onset of Labor With Vaginal Delivery     27 y.o. yo [id number] at [redacted]w[redacted]d was admitted in Active Labor on 12/03/2017. Patient had an uncomplicated labor course as follows:  Membrane Rupture Time/Date: 7:36 PM ,12/03/2017   Intrapartum Procedures: Episiotomy: None [1]                                         Lacerations:  None [1]  Patient had a delivery of a Viable infant. 12/03/2017  Information for the patient's newborn:  Rosamaria, Donn [[id number]]  Delivery Method: Vaginal, Spontaneous(Filed from Delivery Summary)    Pateint had an uncomplicated postpartum course.  She is ambulating, tolerating a regular diet, passing flatus, and urinating well. Patient is discharged home in stable condition on 12/05/17.   Physical exam  Vitals:   12/04/17 0302 12/04/17 1000 12/04/17 1724 12/05/17 0541  BP: 102/64 101/69 107/73 94/74  Pulse: 67 79 84 88  Resp: Temp: 98.4 F (36.9 C) 98.3 F (36.8 C) 99.1 F (37.3 C) 97.8 F (36.6 C)  TempSrc: Oral Oral Oral Oral  SpO2:   100%   Weight:      Height:       General: alert, cooperative and no distress Lochia: appropriate Uterine Fundus: firm Incision: N/A DVT Evaluation: No evidence of DVT seen on physical exam. No significant calf/ankle edema. Labs: Lab Results   Component Value Date   WBC 9.4 12/03/2017   HGB 10.9 (L) 12/03/2017   HCT 33.6 (L) 12/03/2017   MCV 82.6 12/03/2017   PLT 264 12/03/2017   CMP Latest Ref Rng & Units 09/19/2015  Glucose 65 - 104 mg/dL 72  BUN 6 - 20 mg/dL -  Creatinine 1.30 - 8.65 mg/dL -  Sodium 784 - 696 mmol/L -  Potassium 3.5 - 5.1 mmol/L -  Chloride 101 - 111 mmol/L -  CO2 19 - 32 mEq/L -  Calcium 8.4 - 10.5 mg/dL -    Discharge instruction: per After Visit Summary and "Baby and Me Booklet".  After visit meds:  Allergies as of 12/05/2017   No Known Allergies     Medication List    TAKE these medications   ibuprofen 600 MG tablet Commonly known as:  ADVIL,MOTRIN Take 1 tablet (600 mg total)  by mouth every 6 (six) hours.   multivitamin-prenatal 27-0.8 MG Tabs tablet Take 1 tablet by mouth daily at 12 noon.       Diet: routine diet  Activity: Advance as tolerated. Pelvic rest for 6 weeks.   Outpatient follow up: 4 weeks  Postpartum contraception: Undecided; discussed options and handout given  Newborn Data: Live born female  Birth Weight: 7 lb 11.8 oz (3510 g) APGAR: 8, 9  Newborn Delivery   Birth date/time:  12/03/2017 20:31:00 Delivery type:  Vaginal, Spontaneous     Baby Feeding: Breast Disposition:home with mother   12/05/2017 Frederik Pear, MD

## 2017-12-06 ENCOUNTER — Telehealth: Payer: Self-pay | Admitting: Radiology

## 2017-12-06 NOTE — Telephone Encounter (Signed)
Left message explaining postpartum appointment and time 12/31/17 @ 9:30 with Dr Shawnie Pons. All OB visit had been cancelled.

## 2017-12-09 ENCOUNTER — Encounter: Payer: BLUE CROSS/BLUE SHIELD | Admitting: Obstetrics and Gynecology

## 2017-12-27 NOTE — Progress Notes (Signed)
Post Partum Exam  Adrienne Griffin is a 27 y.o. [id number] female who presents for a postpartum visit. She is 4 weeks postpartum following a spontaneous vaginal delivery. I have fully reviewed the prenatal and intrapartum course. The delivery was at 39.4 gestational weeks.  Anesthesia: epidural. Postpartum course has been unremarkable. Baby's course has been unremarkable. Baby is feeding by bottle - Gerber Gentle. Bleeding no bleeding. Bowel function is normal. Bladder function is normal. Patient is sexually active. Contraception method is none. Postpartum depression screening: Score=0  The following portions of the patient's history were reviewed and updated as appropriate: allergies, current medications, past family history, past medical history, past social history, past surgical history and problem list. Last pap smear done 04/22/2015 and was Normal  Review of Systems Pertinent items noted in HPI and remainder of comprehensive ROS otherwise negative.    Objective:  unknown if currently breastfeeding.  General:  alert, cooperative and appears stated age  Lungs: normal effort  Heart:  regular rate and rhythm  Abdomen: soft, non-tender        Assessment:    Normal postpartum exam. Pap smear not done at today's visit.   Plan:   1. Contraception: coitus interruptus 2. Pap due 9/19 3. Follow up in: 3 months or as needed.

## 2017-12-31 ENCOUNTER — Ambulatory Visit (INDEPENDENT_AMBULATORY_CARE_PROVIDER_SITE_OTHER): Payer: BLUE CROSS/BLUE SHIELD | Admitting: Family Medicine

## 2017-12-31 ENCOUNTER — Encounter: Payer: Self-pay | Admitting: Family Medicine

## 2017-12-31 NOTE — Patient Instructions (Signed)
Natural Family Planning Introduction Natural Family Planning (NFP) is a type of birth control without using any form of contraception. Women who use NFP should not have sexual intercourse when the ovary produces an egg (ovulation) during the menstrual cycle. The NFP method is safe and can prevent pregnancy. It is 75% effective when practiced right. The man needs to also understand this method of birth control and the woman needs to be aware of how her body functions during her menstrual cycle. NFP can also be used as a method of getting pregnant. HOW THE NFP METHOD WORKS  A woman's menstrual period usually happens every 28-30 days (it can vary from 23-35 days).  Ovulation happens 12-14 days before the start of the next menstrual period (the fertile period). The egg is fertile for 24 hours and the sperm can live for 3 days or more. If there is sexual intercourse at this time, pregnancy can occur. THERE ARE MANY TYPES OF NFP METHODS USED TO PREVENT PREGNANCY  The basal body temperature method. Often times, there is a slight increase of body temperature when a woman ovulates. Take your temperature every morning before getting out of bed. Write the temperature on a chart. An increase in the temperature shows ovulation has happened. Do not have sexual intercourse from the menstrual period up to three days after the increase in the temperature. Note that the body temperature may increase as a result of fever, restless sleep, and working schedules.  The ovulation cervical mucus method. During the menstrual cycle, the cervical mucus changes from dry and sticky to wet and slippery. Check the mucus of the vagina every day to look for these changes. Just before ovulation, the mucus becomes wet and slippery. On the last day of wetness, ovulation happens. To avoid getting pregnant, sexual intercourse is safe for about 10 days after the menstrual period and on the dry mucus days. Do not have sexual intercourse when  the mucus starts to show up and not until 4 days after the wet and slippery mucus goes away. Sexual intercourse after the 4 days have passed until the menstrual period starts is a safe time. Note that the mucus from the vagina can increase because of a vaginal or cervical infection, lubricants, some medicines, and sexual excitement.  The symptothermal method. This method uses both the temperature and the ovulation methods. Combine the two methods above to prevent pregnancy.  The calendar method. Record your menstrual periods and length of the cycles for 6 months. This is helpful when the menstrual cycle varies in the length of the cycle. The length of a menstrual cycle is from day 1 of the present menstrual period to day 1 of the next menstrual period. Then, find your fertile days of the month and do not have sexual intercourse during that time. You may need help from your health care provider to find out your fertile days. There are some signs of ovulation that may be helpful when trying to find the time of ovulation. This includes vaginal spotting or abdominal cramps during the middle of your menstrual cycle. Not all women have these symptoms. YOU SHOULD NOT USE NFP IF:  You have very irregular menstrual periods and may skip months.  You have abnormal bleeding.  You have a vaginal or cervical infection.  You are on medicines that can affect the vaginal mucus or body temperature. These medicines include antibiotics, thyroid medicines, and antihistamines (cold and allergy medicine). This information is not intended to replace advice given   to you by your health care provider. Make sure you discuss any questions you have with your health care provider. Document Released: 12/30/2007 Document Revised: 12/19/2015 Document Reviewed: 01/13/2013 Elsevier Interactive Patient Education  2017 [employer]vier Inc.  

## 2018-06-29 ENCOUNTER — Other Ambulatory Visit: Payer: BLUE CROSS/BLUE SHIELD

## 2018-07-10 ENCOUNTER — Encounter (HOSPITAL_COMMUNITY): Payer: Self-pay

## 2018-07-10 ENCOUNTER — Inpatient Hospital Stay (HOSPITAL_COMMUNITY)
Admission: AD | Admit: 2018-07-10 | Discharge: 2018-07-10 | Disposition: A | Discharge status: Home / Self Care | Payer: Medicaid Other | Attending: Obstetrics & Gynecology | Admitting: Obstetrics & Gynecology

## 2018-07-10 DIAGNOSIS — O09891 Supervision of other high risk pregnancies, first trimester: Secondary | ICD-10-CM | POA: Insufficient documentation

## 2018-07-10 DIAGNOSIS — Z3A09 9 weeks gestation of pregnancy: Secondary | ICD-10-CM | POA: Insufficient documentation

## 2018-07-10 DIAGNOSIS — O26811 Pregnancy related exhaustion and fatigue, first trimester: Secondary | ICD-10-CM

## 2018-07-10 LAB — URINALYSIS, ROUTINE W REFLEX MICROSCOPIC
BILIRUBIN URINE: NEGATIVE
Glucose, UA: NEGATIVE mg/dL
HGB URINE DIPSTICK: NEGATIVE
KETONES UR: NEGATIVE mg/dL
Nitrite: NEGATIVE
PH: 7 (ref 5.0–8.0)
Protein, ur: NEGATIVE mg/dL
SPECIFIC GRAVITY, URINE: 1.015 (ref 1.005–1.030)

## 2018-07-10 LAB — URINALYSIS, MICROSCOPIC (REFLEX): RBC / HPF: NONE SEEN RBC/hpf (ref 0–5)

## 2018-07-10 LAB — POCT PREGNANCY, URINE: Preg Test, Ur: POSITIVE — AB

## 2018-07-10 MED ORDER — VITAMIN B-6 100 MG PO TABS: 100.0000 mg | ORAL_TABLET | Freq: Two times a day (BID) | ORAL | 2 refills | Status: DC | PRN

## 2018-07-10 MED ORDER — CONCEPT OB 130-92.4-1 MG PO CAPS: 1.0000 | ORAL_CAPSULE | Freq: Every day | ORAL | 12 refills | Status: AC

## 2018-07-10 NOTE — MAU Provider Note (Signed)
Chief Complaint: Possible Pregnancy; Dizziness; Fatigue; and Anorexia   First Provider Initiated Contact with Patient 07/10/18 1148     SUBJECTIVE HPI: Adrienne Griffin is a 27 y.o. G3P2002 at [redacted]w[redacted]d who presents to Maternity Admissions reporting feeling tired, having decreased appetite and being late for her period. Faint pos home UPT tow weeks ago. Very nervous because she a a 64 month old baby.   Associated signs and symptoms: Neg for abd pain, VB, vaginal discharge. Mild nausea.   Past Medical History:  Diagnosis Date  . Medical history non-contributory    OB History  Gravida Para Term Preterm AB Living  3 2 2     2   SAB TAB Ectopic Multiple Live Births        0 2    # Outcome Date GA Lbr Len/2nd Weight Sex Delivery Anes PTL Lv  3 Current           2 Term 12/03/17 [redacted]w[redacted]d 16:22 / 01:39 3510 g M Vag-Spont EPI  LIV  1 Term 12/15/15 [redacted]w[redacted]d 18:45 / 06:49 3445 g M Vag-Vacuum EPI  LIV   Past Surgical History:  Procedure Laterality Date  . NO PAST SURGERIES    . wisdom tooth removal  2010   Social History   Socioeconomic History  . Marital status: Married    Spouse name: Not on file  . Number of children: Not on file  . Years of education: Not on file  . Highest education level: Not on file  Occupational History  . Not on file  Social Needs  . Financial resource strain: Not on file  . Food insecurity:    Worry: Not on file    Inability: Not on file  . Transportation needs:    Medical: Not on file    Non-medical: Not on file  Tobacco Use  . Smoking status: Never Smoker  . Smokeless tobacco: Never Used  Substance and Sexual Activity  . Alcohol use: Yes    Comment: occasional pre-pregnancy  . Drug use: No  . Sexual activity: Yes    Partners: Male    Birth control/protection: None  Lifestyle  . Physical activity:    Days per week: Not on file    Minutes per session: Not on file  . Stress: Not on file  Relationships  . Social connections:    Talks on phone: Not  on file    Gets together: Not on file    Attends religious service: Not on file    Active member of club or organization: Not on file    Attends meetings of clubs or organizations: Not on file    Relationship status: Not on file  . Intimate partner violence:    Fear of current or ex partner: Not on file    Emotionally abused: Not on file    Physically abused: Not on file    Forced sexual activity: Not on file  Other Topics Concern  . Not on file  Social History Narrative  . Not on file   Family History  Problem Relation Age of Onset  . Hypertension Mother   . Diabetes Mother    No current facility-administered medications on file prior to encounter.    Current Outpatient Medications on File Prior to Encounter  Medication Sig Dispense Refill  . ibuprofen (ADVIL,MOTRIN) 600 MG tablet Take 1 tablet (600 mg total) by mouth every 6 (six) hours. 30 tablet 0  . Prenatal Vit-Fe Fumarate-FA (MULTIVITAMIN-PRENATAL) 27-0.8 MG TABS tablet Take  1 tablet by mouth daily at 12 noon.     No Known Allergies  I have reviewed patient's Past Medical Hx, Surgical Hx, Family Hx, Social Hx, medications and allergies.   Review of Systems  Constitutional: Positive for fatigue. Negative for chills and fever.  Gastrointestinal: Negative for abdominal pain, nausea and vomiting.  Genitourinary: Negative for vaginal bleeding and vaginal discharge.  Musculoskeletal: Negative for back pain.  Neurological: Negative for dizziness.    OBJECTIVE Patient Vitals for the past 24 hrs:  BP Temp Temp src Pulse Resp Weight  07/10/18 1053 114/67 98.1 F (36.7 C) Oral 76 18 84.1 kg   Constitutional: Well-developed, well-nourished female in no acute distress.  Cardiovascular: normal rate Respiratory: normal rate and effort.  GI: Abd soft, non-tender, fundus non-palpable Neurologic: Alert and oriented x 4.  GU: FHR 171 by formal BS [place].   LAB RESULTS Results for orders placed or performed during the hospital  encounter of 07/10/18 (from the past 24 hour(s))  Urinalysis, Routine w reflex microscopic     Status: Abnormal   Collection Time: 07/10/18 11:04 AM  Result Value Ref Range   Color, Urine YELLOW YELLOW   APPearance HAZY (A) CLEAR   Specific Gravity, Urine 1.015 1.005 - 1.030   pH 7.0 5.0 - 8.0   Glucose, UA NEGATIVE NEGATIVE mg/dL   Hgb urine dipstick NEGATIVE NEGATIVE   Bilirubin Urine NEGATIVE NEGATIVE   Ketones, ur NEGATIVE NEGATIVE mg/dL   Protein, ur NEGATIVE NEGATIVE mg/dL   Nitrite NEGATIVE NEGATIVE   Leukocytes, UA MODERATE (A) NEGATIVE  Urinalysis, Microscopic (reflex)     Status: Abnormal   Collection Time: 07/10/18 11:04 AM  Result Value Ref Range   RBC / HPF NONE SEEN 0 - 5 RBC/hpf   WBC, UA 0-5 0 - 5 WBC/hpf   Bacteria, UA MANY (A) NONE SEEN   Squamous Epithelial / LPF 21-50 0 - 5  Pregnancy, urine POC     Status: Abnormal   Collection Time: 07/10/18 11:12 AM  Result Value Ref Range   Preg Test, Ur POSITIVE (A) NEGATIVE    IMAGING CRL corresponds to 10.0 weeks. FHR 171.   MAU COURSE Orders Placed This Encounter  Procedures  . Urinalysis, Routine w reflex microscopic  . Urinalysis, Microscopic (reflex)  . Pregnancy, urine POC  . Discharge patient   Meds ordered this encounter  Medications  . Prenat w/o A Vit-FeFum-FePo-FA (CONCEPT OB) 130-92.4-1 MG CAPS    Sig: Take 1 tablet by mouth daily.    Dispense:  30 capsule    Refill:  12    Order Specific Question:   Supervising Provider    Answer:   Willodean Rosenthal [id number]  . pyridOXINE (VITAMIN B-6) 100 MG tablet    Sig: Take 1 tablet (100 mg total) by mouth 2 (two) times daily as needed (nausea and vomiting).    Dispense:  60 tablet    Refill:  2    Order Specific Question:   Supervising Provider    Answer:   Willodean Rosenthal [[id number]]    MDM  ASSESSMENT 1. Fatigue during pregnancy in first trimester   2. Short interval between pregnancies affecting pregnancy in first trimester, antepartum      PLAN Discharge home in stable condition. First trimester precautions B6 for nausea. Rx PNV.  Follow-up Information    [facility] Follow up on 07/11/2018.   Specialty:  Obstetrics and Gynecology Why:  As scheduled.  Ask if they can  do your new OB labs. Contact information: [address] [place] [id number] [phone]       [facility] Follow up.   Specialty:  Obstetrics and Gynecology Why:  In pregnancy emergencies Contact information: [address] [id number] [place] [id number] [phone]         Allergies as of 07/10/2018   No Known Allergies     Medication List    STOP taking these medications   ibuprofen 600 MG tablet Commonly known as:  ADVIL,MOTRIN   multivitamin-prenatal 27-0.8 MG Tabs tablet     TAKE these medications   CONCEPT OB 130-92.4-1 MG Caps Take 1 tablet by mouth daily.   pyridOXINE 100 MG tablet Commonly known as:  VITAMIN B-6 Take 1 tablet (100 mg total) by mouth 2 (two) times daily as needed (nausea and vomiting).        Katrinka BlazingSmith, [place], CNM 07/10/2018  12:22 PM

## 2018-07-10 NOTE — MAU Note (Signed)
Unsure if preg, states she had a faint positive 2 weeks ago  Feeling lightheaded, fatigued, and has a loss of appetite on going for about a month  LMP 05/04/18  No pain, bleeding, or discharge

## 2018-07-10 NOTE — Discharge Instructions (Signed)
Safe Medications in Pregnancy   Acne: Benzoyl Peroxide Salicylic Acid  Backache/Headache: Tylenol: 2 regular strength every 4 hours OR              2 Extra strength every 6 hours  Colds/Coughs/Allergies: Benadryl (alcohol free) 25 mg every 6 hours as needed Breath right strips Claritin Cepacol throat lozenges Chloraseptic throat spray Cold-Eeze- up to three times per day Cough drops, alcohol free Flonase (by prescription only) Guaifenesin Mucinex Robitussin DM (plain only, alcohol free) Saline nasal spray/drops Sudafed (pseudoephedrine) & Actifed ** use only after [redacted] weeks gestation and if you do not have high blood pressure Tylenol Vicks Vaporub Zinc lozenges Zyrtec   Constipation: Colace Ducolax suppositories Fleet enema Glycerin suppositories Metamucil Milk of magnesia Miralax Senokot Smooth move tea  Diarrhea: Kaopectate Imodium A-D  *NO pepto Bismol  Hemorrhoids: Anusol Anusol HC Preparation H Tucks  Indigestion: Tums Maalox Mylanta Zantac  Pepcid  Insomnia: Benadryl (alcohol free) 25mg  every 6 hours as needed Tylenol PM Unisom, no Gelcaps  Leg Cramps: Tums MagGel  Nausea/Vomiting:  Bonine Dramamine Emetrol Ginger extract Sea bands Meclizine  Nausea medication to take during pregnancy:  Unisom (doxylamine succinate 25 mg tablets) Take one tablet daily at bedtime. If symptoms are not adequately controlled, the dose can be increased to a maximum recommended dose of two tablets daily (1/2 tablet in the morning, 1/2 tablet mid-afternoon and one at bedtime). **Vitamin B6 100mg  tablets. Take one tablet twice a day (up to 200 mg per day).  Skin Rashes: Aveeno products Benadryl cream or 25mg  every 6 hours as needed Calamine Lotion 1% cortisone cream  Yeast infection: Gyne-lotrimin 7 Monistat 7   **If taking multiple medications, please check labels to avoid duplicating the same active ingredients **take medication as directed on  the label ** Do not exceed 4000 mg of tylenol in 24 hours **Do not take medications that contain aspirin or ibuprofen    How a Baby Grows During Pregnancy Pregnancy begins when a female's sperm enters a female's egg (fertilization). This happens in one of the tubes (fallopian tubes) that connect the ovaries to the womb (uterus). The fertilized egg is called an embryo until it reaches 10 weeks. From 10 weeks until birth, it is called a fetus. The fertilized egg moves down the fallopian tube to the uterus. Then it implants into the lining of the uterus and begins to grow. The developing fetus receives oxygen and nutrients through the pregnant woman's bloodstream and the tissues that grow (placenta) to support the fetus. The placenta is the life support system for the fetus. It provides nutrition and removes waste. Learning as much as you can about your pregnancy and how your baby is developing can help you enjoy the experience. It can also make you aware of when there might be a problem and when to ask questions. How long does a typical pregnancy last? A pregnancy usually lasts 280 days, or about 40 weeks. Pregnancy is divided into three trimesters:  First trimester: 0-13 weeks.  Second trimester: 14-27 weeks.  Third trimester: 28-40 weeks.  The day when your baby is considered ready to be born (full term) is your estimated date of delivery. How does my baby develop month by month? First month  The fertilized egg attaches to the inside of the uterus.  Some cells will form the placenta. Others will form the fetus.  The arms, legs, brain, spinal cord, lungs, and heart begin to develop.  At the end of the first month,  the heart begins to beat.  Second month  The bones, inner ear, eyelids, hands, and feet form.  The genitals develop.  By the end of 8 weeks, all major organs are developing.  Third month  All of the internal organs are forming.  Teeth develop below the gums.  Bones  and muscles begin to grow. The spine can flex.  The skin is transparent.  Fingernails and toenails begin to form.  Arms and legs continue to grow longer, and hands and feet develop.  The fetus is about 3 in (7.6 cm) long.  Fourth month  The placenta is completely formed.  The external sex organs, neck, outer ear, eyebrows, eyelids, and fingernails are formed.  The fetus can hear, swallow, and move its arms and legs.  The kidneys begin to produce urine.  The skin is covered with a white waxy coating (vernix) and very fine hair (lanugo).  Fifth month  The fetus moves around more and can be felt for the first time (quickening).  The fetus starts to sleep and wake up and may begin to suck its finger.  The nails grow to the end of the fingers.  The organ in the digestive system that makes bile (gallbladder) functions and helps to digest the nutrients.  If your baby is a girl, eggs are present in her ovaries. If your baby is a boy, testicles start to move down into his scrotum.  Sixth month  The lungs are formed, but the fetus is not yet able to breathe.  The eyes open. The brain continues to develop.  Your baby has fingerprints and toe prints. Your baby's hair grows thicker.  At the end of the second trimester, the fetus is about 9 in (22.9 cm) long.  Seventh month  The fetus kicks and stretches.  The eyes are developed enough to sense changes in light.  The hands can make a grasping motion.  The fetus responds to sound.  Eighth month  All organs and body systems are fully developed and functioning.  Bones harden and taste buds develop. The fetus may hiccup.  Certain areas of the brain are still developing. The skull remains soft.  Ninth month  The fetus gains about  lb (0.23 kg) each week.  The lungs are fully developed.  Patterns of sleep develop.  The fetus's head typically moves into a head-down position (vertex) in the uterus to prepare for  birth. If the buttocks move into a vertex position instead, the baby is breech.  The fetus weighs 6-9 lbs (2.72-4.08 kg) and is 19-20 in (48.26-50.8 cm) long.  What can I do to have a healthy pregnancy and help my baby develop? Eating and Drinking  Eat a healthy diet. ? Talk with your health care provider to make sure that you are getting the nutrients that you and your baby need. ? Visit www.DisposableNylon.be to learn about creating a healthy diet.  Gain a healthy amount of weight during pregnancy as advised by your health care provider. This is usually 25-35 pounds. You may need to: ? Gain more if you were underweight before getting pregnant or if you are pregnant with more than one baby. ? Gain less if you were overweight or obese when you got pregnant.  Medicines and Vitamins  Take prenatal vitamins as directed by your health care provider. These include vitamins such as folic acid, iron, calcium, and vitamin D. They are important for healthy development.  Take medicines only as directed by your health  care provider. Read labels and ask a pharmacist or your health care provider whether over-the-counter medicines, supplements, and prescription drugs are safe to take during pregnancy.  Activities  Be physically active as advised by your health care provider. Ask your health care provider to recommend activities that are safe for you to do, such as walking or swimming.  Do not participate in strenuous or extreme sports.  Lifestyle  Do not drink alcohol.  Do not use any tobacco products, including cigarettes, chewing tobacco, or electronic cigarettes. If you need help quitting, ask your health care provider.  Do not use illegal drugs.  Safety  Avoid exposure to mercury, lead, or other heavy metals. Ask your health care provider about common sources of these heavy metals.  Avoid listeria infection during pregnancy. Follow these precautions: ? Do not eat soft cheeses or deli  meats. ? Do not eat hot dogs unless they have been warmed up to the point of steaming, such as in the microwave oven. ? Do not drink unpasteurized milk.  Avoid toxoplasmosis infection during pregnancy. Follow these precautions: ? Do not change your cat's litter box, if you have a cat. Ask someone else to do this for you. ? Wear gardening gloves while working in the yard.  General Instructions  Keep all follow-up visits as directed by your health care provider. This is important. This includes prenatal care and screening tests.  Manage any chronic health conditions. Work closely with your health care provider to keep conditions, such as diabetes, under control.  How do I know if my baby is developing well? At each prenatal visit, your health care provider will do several different tests to check on your health and keep track of your babys development. These include:  Fundal height. ? Your health care provider will measure your growing belly from top to bottom using a tape measure. ? Your health care provider will also feel your belly to determine your baby's position.  Heartbeat. ? An ultrasound in the first trimester can confirm pregnancy and show a heartbeat, depending on how far along you are. ? Your health care provider will check your baby's heart rate at every prenatal visit. ? As you get closer to your delivery date, you may have regular fetal heart rate monitoring to make sure that your baby is not in distress.  Second trimester ultrasound. ? This ultrasound checks your baby's development. It also indicates your babys gender.  What should I do if I have concerns about my baby's development? Always talk with your health care provider about any concerns that you may have. This information is not intended to replace advice given to you by your health care provider. Make sure you discuss any questions you have with your health care provider. Document Released: 12/30/2007 Document  Revised: 12/19/2015 Document Reviewed: 12/20/2013 Elsevier Interactive Patient Education  [employer]2018 [employer]

## 2018-07-11 ENCOUNTER — Other Ambulatory Visit: Payer: Medicaid Other

## 2018-07-27 NOTE — L&D Delivery Note (Addendum)
Patient: Adrienne Griffin MRN: [id number]  GBS status: Negative  Patient is a 28 y.o. now G3P3003 s/p NSVD at [redacted]w[redacted]d, who was admitted for SOL. SROM 0h 73m prior to delivery with clear fluid.    Delivery Note At 8:24 PM a viable female was delivered via Vaginal, Spontaneous (Presentation: Right OA).  APGAR: 9, 9; weight pending.   Placenta status: Spontaneous, intact Cord: 3 vessel, intact with the following complications: precipitous labor.    Anesthesia: Epidural  Episiotomy: None Lacerations: None Est. Blood Loss (mL):  275  Head delivered right OA. Nuchal cord x1 present. Shoulder and body delivered in usual fashion. Infant with spontaneous cry, placed on mother's abdomen, dried and bulb suctioned. Cord clamped x 2 after 1-minute delay, and cut by family member. Cord blood drawn. Placenta delivered spontaneously with gentle cord traction. Fundus firm with massage and Pitocin. Perineum inspected and found to have no lacerations.  Mom to postpartum.  Baby to Couplet care / Skin to Skin.  Patriciaann Clan 02/06/2019, 8:42 PM   I was gloved and present for entire delivery SVD without incident No difficulty with shoulders Lacerations as listed above Repair of same supervised by me  Mallie Snooks, CNM 02/06/19 11:34 PM

## 2018-07-28 ENCOUNTER — Ambulatory Visit (INDEPENDENT_AMBULATORY_CARE_PROVIDER_SITE_OTHER): Payer: Medicaid Other | Admitting: Obstetrics and Gynecology

## 2018-07-28 ENCOUNTER — Encounter: Payer: Self-pay | Admitting: Obstetrics and Gynecology

## 2018-07-28 ENCOUNTER — Other Ambulatory Visit (HOSPITAL_COMMUNITY)
Admission: RE | Admit: 2018-07-28 | Discharge: 2018-07-28 | Disposition: A | Discharge status: Home / Self Care | Payer: Medicaid Other | Source: Ambulatory Visit | Attending: Obstetrics and Gynecology | Admitting: Obstetrics and Gynecology

## 2018-07-28 VITALS — BP 114/76 | HR 86 | Wt 186.0 lb

## 2018-07-28 DIAGNOSIS — Z3481 Encounter for supervision of other normal pregnancy, first trimester: Secondary | ICD-10-CM | POA: Insufficient documentation

## 2018-07-28 DIAGNOSIS — Z3490 Encounter for supervision of normal pregnancy, unspecified, unspecified trimester: Secondary | ICD-10-CM | POA: Insufficient documentation

## 2018-07-28 DIAGNOSIS — O2340 Unspecified infection of urinary tract in pregnancy, unspecified trimester: Secondary | ICD-10-CM

## 2018-07-28 DIAGNOSIS — O2341 Unspecified infection of urinary tract in pregnancy, first trimester: Secondary | ICD-10-CM

## 2018-07-28 DIAGNOSIS — E669 Obesity, unspecified: Secondary | ICD-10-CM | POA: Insufficient documentation

## 2018-07-28 DIAGNOSIS — Z3A12 12 weeks gestation of pregnancy: Secondary | ICD-10-CM | POA: Diagnosis not present

## 2018-07-28 DIAGNOSIS — Z3401 Encounter for supervision of normal first pregnancy, first trimester: Secondary | ICD-10-CM | POA: Diagnosis present

## 2018-07-28 DIAGNOSIS — O09899 Supervision of other high risk pregnancies, unspecified trimester: Secondary | ICD-10-CM | POA: Insufficient documentation

## 2018-07-28 DIAGNOSIS — O99210 Obesity complicating pregnancy, unspecified trimester: Secondary | ICD-10-CM

## 2018-07-28 DIAGNOSIS — O99211 Obesity complicating pregnancy, first trimester: Secondary | ICD-10-CM

## 2018-07-28 HISTORY — DX: Unspecified infection of urinary tract in pregnancy, unspecified trimester: O23.40

## 2018-07-28 LAB — POCT URINALYSIS DIPSTICK
Blood, UA: NEGATIVE
Leukocytes, UA: NEGATIVE
Nitrite, UA: POSITIVE

## 2018-07-28 MED ORDER — CEPHALEXIN 500 MG PO CAPS: 500.0000 mg | ORAL_CAPSULE | Freq: Four times a day (QID) | ORAL | 0 refills | Status: AC

## 2018-07-28 NOTE — Patient Instructions (Signed)
Your Due Date is February 08 2019 Today you are 12 weeks and 1 day

## 2018-07-28 NOTE — Progress Notes (Signed)
New OB Note  07/28/2018   [facility]  Chief Complaint: NOB  Transfer of Care Patient: no  History of Present Illness: Ms. Rockne MenghiniOtim is a 28 y.o. [id number] @ 12/1 weeks (EDC 7/15, based on Patient's last menstrual period was 05/04/2018.=10wk u/s).  Preg complicated by has Obesity in pregnancy; Encounter for supervision of normal first pregnancy in first trimester; BMI 30-39.9; and UTI in pregnancy on their problem list.   Any events prior to today's visit: no She was using no method when she conceived.  She has Negative signs or symptoms of nausea/vomiting of pregnancy. She has Negative signs or symptoms of miscarriage or preterm labor On any medications around the time she conceived/early pregnancy: No   ROS: A 12-point review of systems was performed and negative, except as stated in the above HPI.  OBGYN History: As per HPI. OB History  Gravida Para Term Preterm AB Living  3 2 2     2   SAB TAB Ectopic Multiple Live Births        0 2    # Outcome Date GA Lbr Len/2nd Weight Sex Delivery Anes PTL Lv  3 Current           2 Term 12/03/17 7722w4d 16:22 / 01:39 7 lb 11.8 oz (3.51 kg) M Vag-Spont EPI  LIV  1 Term 12/15/15 601w1d 18:45 / 06:49 7 lb 9.5 oz (3.445 kg) M Vag-Vacuum EPI  LIV   Any issues with any prior pregnancies: no Prior children are healthy, doing well, and without any problems or issues: yes History of pap smears: Yes. Last pap smear 2016   Past Medical History: Past Medical History:  Diagnosis Date  . Medical history non-contributory     Past Surgical History: Past Surgical History:  Procedure Laterality Date  . WISDOM TOOTH EXTRACTION  2010    Family History:  Family History  Problem Relation Age of Onset  . Hypertension Mother   . Diabetes Mother    She denies any history of mental retardation, birth defects or genetic disorders in her or the FOB's history  Social History:  Social History   Socioeconomic History  .  Marital status: Married    Spouse name: Not on file  . Number of children: Not on file  . Years of education: Not on file  . Highest education level: Not on file  Occupational History  . Not on file  Social Needs  . Financial resource strain: Not on file  . Food insecurity:    Worry: Not on file    Inability: Not on file  . Transportation needs:    Medical: Not on file    Non-medical: Not on file  Tobacco Use  . Smoking status: Never Smoker  . Smokeless tobacco: Never Used  Substance and Sexual Activity  . Alcohol use: Not Currently    Comment: occasional pre-pregnancy  . Drug use: No  . Sexual activity: Yes    Partners: Male    Birth control/protection: None  Lifestyle  . Physical activity:    Days per week: Not on file    Minutes per session: Not on file  . Stress: Not on file  Relationships  . Social connections:    Talks on phone: Not on file    Gets together: Not on file    Attends religious service: Not on file    Active member of club or organization: Not on file    Attends meetings of clubs or  organizations: Not on file    Relationship status: Not on file  . Intimate partner violence:    Fear of current or ex partner: Not on file    Emotionally abused: Not on file    Physically abused: Not on file    Forced sexual activity: Not on file  Other Topics Concern  . Not on file  Social History Narrative  . Not on file     Allergy: No Known Allergies  Health Maintenance:  Mammogram Up to Date: not applicable  Current Outpatient Medications: PNV  Physical Exam:   BP 114/76   Pulse 86   Wt 186 lb (84.4 kg)   LMP 05/04/2018   BMI 37.57 kg/m  Body mass index is 37.57 kg/m. Contractions: Irritability Vag. Bleeding: None. Fundal height: not applicable FHTs: 150  General appearance: Well nourished, well developed female in no acute distress.  Neck:  Supple, normal appearance, and no thyromegaly  Cardiovascular: S1, S2 normal, no murmur, rub or  gallop, regular rate and rhythm Respiratory:  Clear to auscultation bilateral. Normal respiratory effort Abdomen: positive bowel sounds and no masses, hernias; diffusely non tender to palpation, non distended Neuro/Psych:  Normal mood and affect.  Skin:  Warm and dry.  Lymphatic:  No inguinal lymphadenopathy.   Pelvic exam: is not limited by body habitus EGBUS: within normal limits, Vagina: within normal limits and with no blood in the vault, Cervix: normal appearing cervix without discharge or lesions, closed/long/high, Uterus:  enlarged, c/w 12 week size, and Adnexa:  normal adnexa and no mass, fullness, tenderness  Laboratory: none  Imaging:  No new imaging  Assessment: pt doing well  Plan: 1. Encounter for supervision of normal first pregnancy in first trimester Routine care. Declines genetics. Offer afp nv - Obstetric Panel, Including HIV - Culture, OB Urine - Comprehensive metabolic panel - Hemoglobin A1c - TSH - Protein / creatinine ratio, urine - Cytology - PAP( [place])  2. Obesity in pregnancy Baseline labs - Comprehensive metabolic panel - Hemoglobin A1c - TSH - Protein / creatinine ratio, urine  3. Urinary tract infection in mother during pregnancy, antepartum +nitrites. F/u ucx. Keflex given  Problem list reviewed and updated.  Follow up in 3 weeks.  The nature of [facility] with multiple MDs and other Advanced Practice Providers was explained to patient; also emphasized that residents, students are part of our team.  >50% of 25 min visit spent on counseling and coordination of care.     Cornelia Copaharlie Daylyn Azbill, Jr. MD Attending Center [facility]) Declines genetics

## 2018-07-29 LAB — OBSTETRIC PANEL, INCLUDING HIV
Antibody Screen: NEGATIVE
Basophils Absolute: 0 10*3/uL (ref 0.0–0.2)
Basos: 1 %
EOS (ABSOLUTE): 0.1 10*3/uL (ref 0.0–0.4)
Eos: 2 %
HIV Screen 4th Generation wRfx: NONREACTIVE
Hematocrit: 34.4 % (ref 34.0–46.6)
Hemoglobin: 11.7 g/dL (ref 11.1–15.9)
Hepatitis B Surface Ag: NEGATIVE
Immature Grans (Abs): 0 10*3/uL (ref 0.0–0.1)
Immature Granulocytes: 0 %
Lymphocytes Absolute: 2.2 10*3/uL (ref 0.7–3.1)
Lymphs: 38 %
MCH: 29.2 pg (ref 26.6–33.0)
MCHC: 34 g/dL (ref 31.5–35.7)
MCV: 86 fL (ref 79–97)
Monocytes Absolute: 0.4 10*3/uL (ref 0.1–0.9)
Monocytes: 7 %
Neutrophils Absolute: 3 10*3/uL (ref 1.4–7.0)
Neutrophils: 52 %
Platelets: 318 10*3/uL (ref 150–450)
RBC: 4.01 x10E6/uL (ref 3.77–5.28)
RDW: 12.3 % (ref 12.3–15.4)
RPR Ser Ql: NONREACTIVE
RUBELLA: 1.47 {index} (ref >0.99)
Rh Factor: POSITIVE
WBC: 5.7 10*3/uL (ref 3.4–10.8)

## 2018-07-29 LAB — PROTEIN / CREATININE RATIO, URINE
Creatinine, Urine: 185.8 mg/dL
Protein, Ur: 28.3 mg/dL
Protein/Creat Ratio: 152 mg/g creat (ref 0–200)

## 2018-07-29 LAB — COMPREHENSIVE METABOLIC PANEL
ALBUMIN: 3.9 g/dL (ref 3.5–5.5)
ALT: 9 IU/L (ref 0–32)
AST: 9 IU/L (ref 0–40)
Albumin/Globulin Ratio: 1.4 (ref 1.2–2.2)
Alkaline Phosphatase: 52 IU/L (ref 39–117)
BUN/Creatinine Ratio: 15 (ref 9–23)
BUN: 7 mg/dL (ref 6–20)
Bilirubin Total: <0.2 mg/dL (ref 0.0–1.2)
CO2: 17 mmol/L — ABNORMAL LOW (ref 20–29)
Calcium: 8.7 mg/dL (ref 8.7–10.2)
Chloride: 104 mmol/L (ref 96–106)
Creatinine, Ser: 0.46 mg/dL — ABNORMAL LOW (ref 0.57–1.00)
GFR calc Af Amer: 159 mL/min/{1.73_m2} (ref >59)
GFR calc non Af Amer: 138 mL/min/{1.73_m2} (ref >59)
GLUCOSE: 85 mg/dL (ref 65–99)
Globulin, Total: 2.7 g/dL (ref 1.5–4.5)
Potassium: 3.8 mmol/L (ref 3.5–5.2)
Sodium: 135 mmol/L (ref 134–144)
Total Protein: 6.6 g/dL (ref 6.0–8.5)

## 2018-07-29 LAB — TSH: TSH: 0.941 u[IU]/mL (ref 0.450–4.500)

## 2018-07-29 LAB — HEMOGLOBIN A1C
Est. average glucose Bld gHb Est-mCnc: 105 mg/dL
Hgb A1c MFr Bld: 5.3 % (ref 4.8–5.6)

## 2018-07-31 LAB — URINE CULTURE, OB REFLEX

## 2018-07-31 LAB — CULTURE, OB URINE

## 2018-08-01 LAB — CYTOLOGY - PAP
Chlamydia: NEGATIVE
Diagnosis: NEGATIVE
Neisseria Gonorrhea: NEGATIVE
TRICH (WINDOWPATH): NEGATIVE

## 2018-08-11 ENCOUNTER — Encounter (HOSPITAL_COMMUNITY): Payer: Self-pay | Admitting: *Deleted

## 2018-08-11 ENCOUNTER — Inpatient Hospital Stay (HOSPITAL_COMMUNITY)
Admission: AD | Admit: 2018-08-11 | Discharge: 2018-08-11 | Disposition: A | Discharge status: Home / Self Care | Payer: Medicaid Other | Attending: Obstetrics & Gynecology | Admitting: Obstetrics & Gynecology

## 2018-08-11 ENCOUNTER — Other Ambulatory Visit: Payer: Self-pay

## 2018-08-11 DIAGNOSIS — O2342 Unspecified infection of urinary tract in pregnancy, second trimester: Secondary | ICD-10-CM | POA: Diagnosis not present

## 2018-08-11 DIAGNOSIS — R109 Unspecified abdominal pain: Secondary | ICD-10-CM | POA: Diagnosis present

## 2018-08-11 DIAGNOSIS — Z3492 Encounter for supervision of normal pregnancy, unspecified, second trimester: Secondary | ICD-10-CM

## 2018-08-11 DIAGNOSIS — Z3A14 14 weeks gestation of pregnancy: Secondary | ICD-10-CM

## 2018-08-11 LAB — URINALYSIS, ROUTINE W REFLEX MICROSCOPIC
BACTERIA UA: NONE SEEN
BILIRUBIN URINE: NEGATIVE
Glucose, UA: NEGATIVE mg/dL
Hgb urine dipstick: NEGATIVE
KETONES UR: NEGATIVE mg/dL
Nitrite: NEGATIVE
Protein, ur: 30 mg/dL — AB
Specific Gravity, Urine: 1.023 (ref 1.005–1.030)
Squamous Epithelial / HPF: >50 — ABNORMAL HIGH (ref 0–5)
pH: 6 (ref 5.0–8.0)

## 2018-08-11 MED ORDER — CEPHALEXIN 500 MG PO CAPS: 500.0000 mg | ORAL_CAPSULE | Freq: Four times a day (QID) | ORAL | 0 refills | Status: AC

## 2018-08-11 NOTE — MAU Provider Note (Addendum)
History     CSN: [id number]  Arrival date and time: 08/11/18 1007   First Provider Initiated Contact with Patient 08/11/18 1105      Chief Complaint  Patient presents with  . Abdominal Pain    cramping    HPI   Pt is a 28 y/o female, G3P2002 at 225w1d, presenting for a 2-day history of abdominal cramping.  The cramps were intermittent and subsided yesterday.  She fees like her "stomach moved down." She denies vaginal bleeding and fluid loss. She has had several episodes of diarrhea in the past 2 days, but denies blood in stool. She denies painful urination or blood in urine. She feels concerned about her pregnancy, but she currently feels no pain.  OB History    Gravida  3   Para  2   Term  2   Preterm      AB      Living  2     SAB      TAB      Ectopic      Multiple  0   Live Births  2           Past Medical History:  Diagnosis Date  . Medical history non-contributory     Past Surgical History:  Procedure Laterality Date  . WISDOM TOOTH EXTRACTION  2010    Family History  Problem Relation Age of Onset  . Hypertension Mother   . Diabetes Mother     Social History   Tobacco Use  . Smoking status: Never Smoker  . Smokeless tobacco: Never Used  Substance Use Topics  . Alcohol use: Not Currently    Comment: occasional pre-pregnancy  . Drug use: No    Allergies: No Known Allergies  Medications Prior to Admission  Medication Sig Dispense Refill Last Dose  . Prenat w/o A Vit-FeFum-FePo-FA (CONCEPT OB) 130-92.4-1 MG CAPS Take 1 tablet by mouth daily. 30 capsule 12 Taking  . pyridOXINE (VITAMIN B-6) 100 MG tablet Take 1 tablet (100 mg total) by mouth 2 (two) times daily as needed (nausea and vomiting). (Patient not taking: Reported on 07/28/2018) 60 tablet 2 Not Taking    Review of Systems  Constitutional: Positive for appetite change (decreased appetite in last 2 days) and fatigue.  Respiratory: Negative for cough and shortness of breath.    Cardiovascular: Negative for leg swelling.  Gastrointestinal: Positive for abdominal pain, diarrhea and nausea. Negative for blood in stool, constipation and vomiting.  Genitourinary: Negative for difficulty urinating, dysuria and hematuria.  Neurological: Negative for light-headedness.   Physical Exam   Blood pressure 107/70, pulse 89, temperature 98.2 F (36.8 C), temperature source Oral, resp. rate 18, height 4\' 11"  (1.499 m), weight 83.6 kg, last menstrual period 05/04/2018, SpO2 99 %, not currently breastfeeding.  Physical Exam  Constitutional: She appears well-developed and well-nourished.  Cardiovascular: Normal rate, regular rhythm and normal heart sounds.  No murmur heard. Respiratory: Effort normal and breath sounds normal. She has no wheezes.  GI: Soft. Bowel sounds are normal. There is no abdominal tenderness.  Musculoskeletal:        General: Edema present.  Neurological: She is alert.  Skin: Skin is warm and dry.  Psychiatric: She has a normal mood and affect. Her behavior is normal.    MAU Course  Procedures  MDM Urinalysis: UTI evaluation  Assessment and Plan  --UTI  Plan: --Cephalexin, 500mg  capsule, PO, 4x daily for 10 days  Isabel CapriceDeborah J Barnett 08/11/2018, 11:35 AM  I confirm that I have verified the information documented in the physician assistant's note and that I have also personally reperformed the physical exam and all medical decision making activities.  Provider MDM --Abdominal cramping resolved yesterday without intervention --Patient states she was diagnosed with UTI at new OB visit but was unable to consistently adhere to Keflex QID --Discussed treatment now based on previous diagnosis, cramping and UA results today vs waiting for culture. Patient agrees to intiate treatment today  Patient Vitals for the past 24 hrs:  BP Temp Temp src Pulse Resp SpO2 Height Weight  08/11/18 1153 108/68 98.1 F (36.7 C) Oral 87 20 100 % - -  08/11/18 1020  107/70 98.2 F (36.8 C) Oral 89 18 99 % 4\' 11"  (1.499 m) 83.6 kg    Results for orders placed or performed during the hospital encounter of 08/11/18 (from the past 24 hour(s))  Urinalysis, Routine w reflex microscopic     Status: Abnormal   Collection Time: 08/11/18 10:29 AM  Result Value Ref Range   Color, Urine AMBER (A) YELLOW   APPearance TURBID (A) CLEAR   Specific Gravity, Urine 1.023 1.005 - 1.030   pH 6.0 5.0 - 8.0   Glucose, UA NEGATIVE NEGATIVE mg/dL   Hgb urine dipstick NEGATIVE NEGATIVE   Bilirubin Urine NEGATIVE NEGATIVE   Ketones, ur NEGATIVE NEGATIVE mg/dL   Protein, ur 30 (A) NEGATIVE mg/dL   Nitrite NEGATIVE NEGATIVE   Leukocytes, UA LARGE (A) NEGATIVE   RBC / HPF 6-10 0 - 5 RBC/hpf   WBC, UA 21-50 0 - 5 WBC/hpf   Bacteria, UA NONE SEEN NONE SEEN   Squamous Epithelial / LPF >50 (H) 0 - 5   Mucus PRESENT     Provider Assessment and Plan --28 y.o. [id number] at 724w1d  --FHT 144 by Doppler --Rx Keflex for UTI --Discharge home in stable condition  [facility]  Clayton BiblesSamantha Akia Desroches, CNM 08/11/18  12:02 PM

## 2018-08-11 NOTE — MAU Note (Signed)
Pt states she has been having some lower abd. Cramping for the past few days.  PT denies vag bleeding or LOF.  Pt rates pain 6/10

## 2018-08-11 NOTE — Discharge Instructions (Signed)
Pregnancy and Urinary Tract Infection  What is a urinary tract infection?    A urinary tract infection (UTI) is an infection of any part of the urinary tract. This includes the kidneys, the tubes that connect your kidneys to your bladder (ureters), the bladder, and the tube that carries urine out of your body (urethra). These organs make, store, and get rid of urine in the body.   An upper UTI affects the ureters and kidneys (pyelonephritis), and a lower UTI affects the bladder (cystitis) and urethra (urethritis). Most urinary tract infections are caused by bacteria in your genital area, around the entrance to your urinary tract (urethra). These bacteria grow and cause irritation and inflammation of your urinary tract.  Why am I more likely to get a UTI during pregnancy?  You are more likely to develop a UTI during pregnancy because:  · The physical and hormonal changes your body goes through can make it easier for bacteria to get into your urinary tract.  · Your growing baby puts pressure on your uterus and can affect urine flow.  Does a UTI place my baby at risk?  An untreated UTI during pregnancy could lead to a kidney infection, which can cause health problems that could affect your baby. Possible complications of an untreated UTI include:  · Having your baby before 37 weeks of pregnancy (premature).  · Having a baby with a low birth weight.  · Developing high blood pressure during pregnancy (preeclampsia).  · Having a low hemoglobin level (anemia).  What are the symptoms of a UTI?  Symptoms of a UTI include:  · Needing to urinate right away (urgently).  · Frequent urination or passing small amounts of urine frequently.  · Pain or burning with urination.  · Blood in the urine.  · Urine that smells bad or unusual.  · Trouble urinating.  · Cloudy urine.  · Pain in the abdomen or lower back.  · Vaginal discharge.  You may also have:  · Vomiting or a decreased appetite.  · Confusion.  · Irritability or  tiredness.  · A fever.  · Diarrhea.  What are the treatment options for a UTI during pregnancy?  Treatment for this condition may include:  · Antibiotic medicines that are safe to take during pregnancy.  · Other medicines to treat less common causes of UTI.  How can I prevent a UTI?  To prevent a UTI:  · Go to the bathroom as soon as you feel the need. Do not hold urine for long periods of time.  · Always wipe from front to back after a bowel movement. Use each tissue one time when you wipe.  · Empty your bladder after sex.  · Keep your genital area dry.  · Drink 6-10 glasses of water each day.  · Do not douche or use deodorant sprays.  Contact a health care provider if:  · Your symptoms do not improve or they get worse.  · You have abnormal vaginal discharge.  Get help right away if:  · You have a fever.  · You have nausea and vomiting.  · You have back or side pain.  · You feel contractions in your uterus.  · You have lower belly pain.  · You have a gush of fluid from your vagina.  · You have blood in your urine.  Summary  · A urinary tract infection (UTI) is an infection of any part of the urinary tract, which includes the kidneys, ureters,   bladder, and urethra.  · Most urinary tract infections are caused by bacteria in your genital area, around the entrance to your urinary tract (urethra).  · You are more likely to develop a UTI during pregnancy.  · If you were prescribed an antibiotic, take it as told by your health care provider. Do not stop taking the antibiotic even if you start to feel better.  This information is not intended to replace advice given to you by your health care provider. Make sure you discuss any questions you have with your health care provider.  Document Released: 11/07/2010 Document Revised: 09/07/2017 Document Reviewed: 06/03/2015  Elsevier Interactive Patient Education © 2019 [employer]

## 2018-08-17 ENCOUNTER — Ambulatory Visit (INDEPENDENT_AMBULATORY_CARE_PROVIDER_SITE_OTHER): Payer: Medicaid Other | Admitting: Advanced Practice Midwife

## 2018-08-17 ENCOUNTER — Encounter: Payer: Medicaid Other | Admitting: Advanced Practice Midwife

## 2018-08-17 VITALS — BP 104/54 | HR 87 | Wt 183.0 lb

## 2018-08-17 DIAGNOSIS — O2340 Unspecified infection of urinary tract in pregnancy, unspecified trimester: Secondary | ICD-10-CM

## 2018-08-17 DIAGNOSIS — Z3483 Encounter for supervision of other normal pregnancy, third trimester: Secondary | ICD-10-CM

## 2018-08-17 DIAGNOSIS — O2342 Unspecified infection of urinary tract in pregnancy, second trimester: Secondary | ICD-10-CM

## 2018-08-17 DIAGNOSIS — Z3482 Encounter for supervision of other normal pregnancy, second trimester: Secondary | ICD-10-CM

## 2018-08-17 NOTE — Progress Notes (Signed)
   PRENATAL VISIT NOTE  Subjective:  Adrienne Griffin is a 28 y.o. G3P2002 at [redacted]w[redacted]d being seen today for ongoing prenatal care.  She is currently monitored for the following issues for this low-risk pregnancy and has Obesity in pregnancy; Encounter for supervision of normal first pregnancy in first trimester; BMI 30-39.9; UTI in pregnancy; and Short interval between pregnancies affecting pregnancy, antepartum on their problem list.  Patient reports no complaints.  Contractions: Not present. Vag. Bleeding: None.   . Denies leaking of fluid.   The following portions of the patient's history were reviewed and updated as appropriate: allergies, current medications, past family history, past medical history, past social history, past surgical history and problem list. Problem list updated.  Objective:   Vitals:   08/17/18 1111  BP: (!) 104/54  Pulse: 87  Weight: 83 kg    Fetal Status: Fetal Heart Rate (bpm): 152         General:  Alert, oriented and cooperative. Patient is in no acute distress.  Skin: Skin is warm and dry. No rash noted.   Cardiovascular: Normal heart rate noted  Respiratory: Normal respiratory effort, no problems with respiration noted  Abdomen: Soft, gravid, appropriate for gestational age.  Pain/Pressure: Absent     Pelvic: Cervical exam deferred        Extremities: Normal range of motion.  Edema: None  Mental Status: Normal mood and affect. Normal behavior. Normal judgment and thought content.   Assessment and Plan:  Pregnancy: G3P2002 at [redacted]w[redacted]d  1. Encounter for supervision of other normal pregnancy in third trimester - No complaints or concerns, continue routine care - Genetic screening declined - [place] MFM OB COMP + 14 WK; Future  2. UTI in pregnancy, antepartum - Compliant with Keflex prescribed in MAU, symptoms resolved  Preterm labor symptoms and general obstetric precautions including but not limited to vaginal bleeding, contractions, leaking of fluid  and fetal movement were reviewed in detail with the patient. Please refer to After Visit Summary for other counseling recommendations.  No follow-ups on file.  No future appointments.  Calvert Cantor, CNM

## 2018-08-17 NOTE — Patient Instructions (Signed)

## 2018-09-14 ENCOUNTER — Ambulatory Visit (HOSPITAL_COMMUNITY)
Admission: RE | Admit: 2018-09-14 | Discharge: 2018-09-14 | Disposition: A | Discharge status: Home / Self Care | Payer: Medicaid Other | Source: Ambulatory Visit | Attending: Advanced Practice Midwife | Admitting: Advanced Practice Midwife

## 2018-09-14 ENCOUNTER — Encounter: Payer: Medicaid Other | Admitting: Advanced Practice Midwife

## 2018-09-14 ENCOUNTER — Other Ambulatory Visit (HOSPITAL_COMMUNITY): Payer: Self-pay | Admitting: *Deleted

## 2018-09-14 ENCOUNTER — Other Ambulatory Visit: Payer: Self-pay | Admitting: Advanced Practice Midwife

## 2018-09-14 DIAGNOSIS — Z6835 Body mass index (BMI) 35.0-35.9, adult: Principal | ICD-10-CM

## 2018-09-14 DIAGNOSIS — E669 Obesity, unspecified: Secondary | ICD-10-CM

## 2018-09-14 DIAGNOSIS — Z3483 Encounter for supervision of other normal pregnancy, third trimester: Secondary | ICD-10-CM

## 2018-09-14 DIAGNOSIS — Z363 Encounter for antenatal screening for malformations: Secondary | ICD-10-CM | POA: Diagnosis not present

## 2018-09-14 DIAGNOSIS — Z3A19 19 weeks gestation of pregnancy: Secondary | ICD-10-CM | POA: Diagnosis not present

## 2018-09-14 DIAGNOSIS — O99212 Obesity complicating pregnancy, second trimester: Secondary | ICD-10-CM | POA: Diagnosis not present

## 2018-09-15 ENCOUNTER — Encounter: Payer: Medicaid Other | Admitting: Obstetrics and Gynecology

## 2018-09-22 ENCOUNTER — Ambulatory Visit (INDEPENDENT_AMBULATORY_CARE_PROVIDER_SITE_OTHER): Payer: Medicaid Other | Admitting: Obstetrics and Gynecology

## 2018-09-22 DIAGNOSIS — Z3401 Encounter for supervision of normal first pregnancy, first trimester: Secondary | ICD-10-CM

## 2018-09-22 DIAGNOSIS — Z3402 Encounter for supervision of normal first pregnancy, second trimester: Secondary | ICD-10-CM

## 2018-09-22 DIAGNOSIS — Z3A20 20 weeks gestation of pregnancy: Secondary | ICD-10-CM

## 2018-09-22 NOTE — Progress Notes (Signed)
Prenatal Visit Note Date: 09/22/2018 Clinic: [facility]  Subjective:  Bannie Khan is a 28 y.o. [id number] at [redacted]w[redacted]d being seen today for ongoing prenatal care.  She is currently monitored for the following issues for this low-risk pregnancy and has Obesity in pregnancy; Encounter for supervision of normal first pregnancy in first trimester; BMI 30-39.9; UTI in pregnancy; and Short interval between pregnancies affecting pregnancy, antepartum on their problem list.  Patient reports no complaints.   Contractions: Not present.  .  Movement: Present. Denies leaking of fluid.   The following portions of the patient's history were reviewed and updated as appropriate: allergies, current medications, past family history, past medical history, past social history, past surgical history and problem list. Problem list updated.  Objective:   Vitals:   09/22/18 1024  BP: 102/71  Pulse: 72  Weight: 185 lb 11.2 oz (84.2 kg)    Fetal Status: Fetal Heart Rate (bpm): 157   Movement: Present     General:  Alert, oriented and cooperative. Patient is in no acute distress.  Skin: Skin is warm and dry. No rash noted.   Cardiovascular: Normal heart rate noted  Respiratory: Normal respiratory effort, no problems with respiration noted  Abdomen: Soft, gravid, appropriate for gestational age. Pain/Pressure: Absent     Pelvic:  Cervical exam deferred        Extremities: Normal range of motion.  Edema: None  Mental Status: Normal mood and affect. Normal behavior. Normal judgment and thought content.   Urinalysis:      Assessment and Plan:  Pregnancy: G3P2002 at [redacted]w[redacted]d  1. Encounter for supervision of normal first pregnancy in first trimester Routine care. D/w pt that if she'd like to decline u/s in may she can and she is leaning towards that. F/u nv. UCx toc nv  Preterm labor symptoms and general obstetric precautions including but not limited to vaginal bleeding, contractions,  leaking of fluid and fetal movement were reviewed in detail with the patient. Please refer to After Visit Summary for other counseling recommendations.  Return in about 3 weeks (around 10/13/2018) for rob.   Valentine Bing, MD

## 2018-10-13 ENCOUNTER — Other Ambulatory Visit: Payer: Self-pay

## 2018-10-13 ENCOUNTER — Ambulatory Visit (INDEPENDENT_AMBULATORY_CARE_PROVIDER_SITE_OTHER): Payer: Medicaid Other | Admitting: Obstetrics & Gynecology

## 2018-10-13 VITALS — BP 105/71 | HR 72 | Wt 188.2 lb

## 2018-10-13 DIAGNOSIS — Z3482 Encounter for supervision of other normal pregnancy, second trimester: Secondary | ICD-10-CM

## 2018-10-13 DIAGNOSIS — O2340 Unspecified infection of urinary tract in pregnancy, unspecified trimester: Secondary | ICD-10-CM

## 2018-10-13 DIAGNOSIS — O2343 Unspecified infection of urinary tract in pregnancy, third trimester: Secondary | ICD-10-CM

## 2018-10-13 DIAGNOSIS — Z3A23 23 weeks gestation of pregnancy: Secondary | ICD-10-CM

## 2018-10-13 NOTE — Patient Instructions (Addendum)
Return to office for any scheduled appointments. Call the office or go to the MAU at [facility] if:  You begin to have strong, frequent contractions  Your water breaks.  Sometimes it is a big gush of fluid, sometimes it is just a trickle that keeps getting your panties wet or running down your legs  You have vaginal bleeding.  It is normal to have a small amount of spotting if your cervix was checked.   You do not feel your baby moving like normal.  If you do not, get something to eat and drink and lay down and focus on feeling your baby move.   If your baby is still not moving like normal, you should call the office or go to MAU.  Any other obstetric concerns.  Tdap Vaccine (Tetanus, Diphtheria and Pertussis): What You Need to Know 1. Why get vaccinated? Tetanus, diphtheria and pertussis are very serious diseases. Tdap vaccine can protect us from these diseases. And, Tdap vaccine given to pregnant women can protect newborn babies against pertussis.. TETANUS (Lockjaw) is rare in the [place] today. It causes painful muscle tightening and stiffness, usually all over the body.  It can lead to tightening of muscles in the head and neck so you can't open your mouth, swallow, or sometimes even breathe. Tetanus kills about 1 out of 10 people who are infected even after receiving the best medical care. DIPHTHERIA is also rare in the [place] today. It can cause a thick coating to form in the back of the throat.  It can lead to breathing problems, heart failure, paralysis, and death. PERTUSSIS (Whooping Cough) causes severe coughing spells, which can cause difficulty breathing, vomiting and disturbed sleep.  It can also lead to weight loss, incontinence, and rib fractures. Up to 2 in 100 adolescents and 5 in 100 adults with pertussis are hospitalized or have complications, which could include pneumonia or death. These diseases are caused by bacteria.  Diphtheria and pertussis are spread from person to person through secretions from coughing or sneezing. Tetanus enters the body through cuts, scratches, or wounds. Before vaccines, as many as 200,000 cases of diphtheria, 200,000 cases of pertussis, and hundreds of cases of tetanus, were reported in the [place] each year. Since vaccination began, reports of cases for tetanus and diphtheria have dropped by about 99% and for pertussis by about 80%. 2. Tdap vaccine Tdap vaccine can protect adolescents and adults from tetanus, diphtheria, and pertussis. One dose of Tdap is routinely given at age 11 or 12. People who did not get Tdap at that age should get it as soon as possible. Tdap is especially important for healthcare professionals and anyone having close contact with a baby younger than 12 months. Pregnant women should get a dose of Tdap during every pregnancy, to protect the newborn from pertussis. Infants are most at risk for severe, life-threatening complications from pertussis. Another vaccine, called Td, protects against tetanus and diphtheria, but not pertussis. A Td booster should be given every 10 years. Tdap may be given as one of these boosters if you have never gotten Tdap before. Tdap may also be given after a severe cut or burn to prevent tetanus infection. Your doctor or the person giving you the vaccine can give you more information. Tdap may safely be given at the same time as other vaccines. 3. Some people should not get this vaccine  A person who has ever had a life-threatening allergic   reaction after a previous dose of any diphtheria, tetanus or pertussis containing vaccine, OR has a severe allergy to any part of this vaccine, should not get Tdap vaccine. Tell the person giving the vaccine about any severe allergies.  Anyone who had coma or long repeated seizures within 7 days after a childhood dose of DTP or DTaP, or a previous dose of Tdap, should not get Tdap, unless a cause  other than the vaccine was found. They can still get Td.  Talk to your doctor if you: ? have seizures or another nervous system problem, ? had severe pain or swelling after any vaccine containing diphtheria, tetanus or pertussis, ? ever had a condition called Guillain-Barr Syndrome (GBS), ? aren't feeling well on the day the shot is scheduled. 4. Risks With any medicine, including vaccines, there is a chance of side effects. These are usually mild and go away on their own. Serious reactions are also possible but are rare. Most people who get Tdap vaccine do not have any problems with it. Mild problems following Tdap (Did not interfere with activities)  Pain where the shot was given (about 3 in 4 adolescents or 2 in 3 adults)  Redness or swelling where the shot was given (about 1 person in 5)  Mild fever of at least 100.4F (up to about 1 in 25 adolescents or 1 in 100 adults)  Headache (about 3 or 4 people in 10)  Tiredness (about 1 person in 3 or 4)  Nausea, vomiting, diarrhea, stomach ache (up to 1 in 4 adolescents or 1 in 10 adults)  Chills, sore joints (about 1 person in 10)  Body aches (about 1 person in 3 or 4)  Rash, swollen glands (uncommon) Moderate problems following Tdap (Interfered with activities, but did not require medical attention)  Pain where the shot was given (up to 1 in 5 or 6)  Redness or swelling where the shot was given (up to about 1 in 16 adolescents or 1 in 12 adults)  Fever over 102F (about 1 in 100 adolescents or 1 in 250 adults)  Headache (about 1 in 7 adolescents or 1 in 10 adults)  Nausea, vomiting, diarrhea, stomach ache (up to 1 or 3 people in 100)  Swelling of the entire arm where the shot was given (up to about 1 in 500). Severe problems following Tdap (Unable to perform usual activities; required medical attention)  Swelling, severe pain, bleeding and redness in the arm where the shot was given (rare). Problems that could happen  after any vaccine:  People sometimes faint after a medical procedure, including vaccination. Sitting or lying down for about 15 minutes can help prevent fainting, and injuries caused by a fall. Tell your doctor if you feel dizzy, or have vision changes or ringing in the ears.  Some people get severe pain in the shoulder and have difficulty moving the arm where a shot was given. This happens very rarely.  Any medication can cause a severe allergic reaction. Such reactions from a vaccine are very rare, estimated at fewer than 1 in a million doses, and would happen within a few minutes to a few hours after the vaccination. As with any medicine, there is a very remote chance of a vaccine causing a serious injury or death. The safety of vaccines is always being monitored. For more information, visit: www.cdc.gov/vaccinesafety/ 5. What if there is a serious problem? What should I look for?  Look for anything that concerns you, such as signs   of a severe allergic reaction, very high fever, or unusual behavior. Signs of a severe allergic reaction can include hives, swelling of the face and throat, difficulty breathing, a fast heartbeat, dizziness, and weakness. These would usually start a few minutes to a few hours after the vaccination. What should I do?  If you think it is a severe allergic reaction or other emergency that can't wait, call 9-1-1 or get the person to the nearest hospital. Otherwise, call your doctor.  Afterward, the reaction should be reported to the Vaccine Adverse Event Reporting System (VAERS). Your doctor might file this report, or you can do it yourself through the VAERS web site at www.vaers.hhs.gov, or by calling 1-800-822-7967. VAERS does not give medical advice. 6. The National Vaccine Injury Compensation Program The National Vaccine Injury Compensation Program (VICP) is a federal program that was created to compensate people who may have been injured by certain  vaccines. Persons who believe they may have been injured by a vaccine can learn about the program and about filing a claim by calling 1-800-338-2382 or visiting the VICP website at www.hrsa.gov/vaccinecompensation. There is a time limit to file a claim for compensation. 7. How can I learn more?  Ask your doctor. He or she can give you the vaccine package insert or suggest other sources of information.  Call your local or state health department.  Contact the Centers for Disease Control and Prevention (CDC): ? Call 1-800-232-4636 (1-800-CDC-INFO) or ? Visit CDC's website at www.cdc.gov/vaccines Vaccine Information Statement Tdap Vaccine (09/19/2013) This information is not intended to replace advice given to you by your health care provider. Make sure you discuss any questions you have with your health care provider. Document Released: 01/12/2012 Document Revised: 02/28/2018 Document Reviewed: 02/28/2018 Elsevier Interactive Patient Education  2019 [employer]  

## 2018-10-13 NOTE — Progress Notes (Signed)
   PRENATAL VISIT NOTE  Subjective:  Adrienne Griffin is a 28 y.o. G3P2002 at [redacted]w[redacted]d being seen today for ongoing prenatal care.  She is currently monitored for the following issues for this low-risk pregnancy and has Obesity in pregnancy; Supervision of normal pregnancy; BMI 30-39.9; UTI in pregnancy; and Short interval between pregnancies affecting pregnancy, antepartum on their problem list.  Patient reports no complaints.  Contractions: Not present.  .  Movement: Present. Denies leaking of fluid.   The following portions of the patient's history were reviewed and updated as appropriate: allergies, current medications, past family history, past medical history, past social history, past surgical history and problem list.   Objective:   Vitals:   10/13/18 1118  BP: 105/71  Pulse: 72  Weight: 188 lb 3.2 oz (85.4 kg)    Fetal Status: Fetal Heart Rate (bpm): 150 Fundal Height: 23 cm Movement: Present     General:  Alert, oriented and cooperative. Patient is in no acute distress.  Skin: Skin is warm and dry. No rash noted.   Cardiovascular: Normal heart rate noted  Respiratory: Normal respiratory effort, no problems with respiration noted  Abdomen: Soft, gravid, appropriate for gestational age.  Pain/Pressure: Absent     Pelvic: Cervical exam deferred        Extremities: Normal range of motion.  Edema: None  Mental Status: Normal mood and affect. Normal behavior. Normal judgment and thought content.   Assessment and Plan:  Pregnancy: G3P2002 at [redacted]w[redacted]d 1. Urinary tract infection in mother during pregnancy, antepartum - Culture, OB Urine being done for TOC.  2. Encounter for supervision of other normal pregnancy in second trimester Preterm labor symptoms and general obstetric precautions including but not limited to vaginal bleeding, contractions, leaking of fluid and fetal movement were reviewed in detail with the patient. Please refer to After Visit Summary for other counseling  recommendations.   Return in about 5 weeks (around 11/17/2018) for 2 hr GTT, 3rd trimester labs, TDap, OB Visit ([facility]).  Future Appointments  Date Time Provider Department Center  12/14/2018 12:45 PM [facility] [place] [facility]    Jaynie Collins, MD

## 2018-10-15 LAB — URINE CULTURE, OB REFLEX

## 2018-10-15 LAB — CULTURE, OB URINE

## 2018-10-31 ENCOUNTER — Telehealth: Payer: Self-pay | Admitting: *Deleted

## 2018-10-31 DIAGNOSIS — Z3482 Encounter for supervision of other normal pregnancy, second trimester: Secondary | ICD-10-CM

## 2018-10-31 NOTE — Telephone Encounter (Signed)
Pt will pick up BP cuff from [place] [facility] and will add her to babyscripts for manual entry of her blood pressures.

## 2018-11-16 NOTE — Progress Notes (Signed)
   PRENATAL VISIT NOTE  Subjective:  Adrienne Griffin is a 28 y.o. G3P2002 at [redacted]w[redacted]d being seen today for ongoing prenatal care.  She is currently monitored for the following issues for this low-risk pregnancy and has Obesity in pregnancy; Supervision of normal pregnancy; BMI 30-39.9; UTI in pregnancy; and Short interval between pregnancies affecting pregnancy, antepartum on their problem list.  Patient reports no complaints.  Contractions: Not present. Vag. Bleeding: None.  Movement: Present. Denies leaking of fluid.   The following portions of the patient's history were reviewed and updated as appropriate: allergies, current medications, past family history, past medical history, past social history, past surgical history and problem list.   Objective:   Vitals:   11/17/18 0834  BP: 97/67  Pulse: 81  Weight: 190 lb (86.2 kg)    Fetal Status: Fetal Heart Rate (bpm): 151   Movement: Present     General:  Alert, oriented and cooperative. Patient is in no acute distress.  Skin: Skin is warm and dry. No rash noted.   Cardiovascular: Normal heart rate noted  Respiratory: Normal respiratory effort, no problems with respiration noted  Abdomen: Soft, gravid, appropriate for gestational age.  Pain/Pressure: Absent     Pelvic: Cervical exam deferred        Extremities: Normal range of motion.  Edema: None  Mental Status: Normal mood and affect. Normal behavior. Normal judgment and thought content.   Assessment and Plan:  Pregnancy: G3P2002 at [redacted]w[redacted]d 1. Encounter for supervision of other normal pregnancy in third trimester Third trimester labs and Tdap - RPR - HIV Antibody (routine testing w rflx) - CBC - Glucose Tolerance, 2 Hours w/1 Hour Preterm labor symptoms and general obstetric precautions including but not limited to vaginal bleeding, contractions, leaking of fluid and fetal movement were reviewed in detail with the patient. Please refer to After Visit Summary for other  counseling recommendations.   Return in about 4 weeks (around 12/15/2018) for Virtual OB Visit.  Future Appointments  Date Time Provider Department Center  12/14/2018 12:45 PM [facility] [place] [facility]    Jaynie Collins, MD

## 2018-11-17 ENCOUNTER — Other Ambulatory Visit: Payer: Self-pay

## 2018-11-17 ENCOUNTER — Ambulatory Visit (INDEPENDENT_AMBULATORY_CARE_PROVIDER_SITE_OTHER): Payer: Medicaid Other | Admitting: Obstetrics & Gynecology

## 2018-11-17 ENCOUNTER — Encounter: Payer: Self-pay | Admitting: Obstetrics & Gynecology

## 2018-11-17 VITALS — BP 97/67 | HR 81 | Wt 190.0 lb

## 2018-11-17 DIAGNOSIS — Z23 Encounter for immunization: Secondary | ICD-10-CM | POA: Diagnosis not present

## 2018-11-17 DIAGNOSIS — Z3483 Encounter for supervision of other normal pregnancy, third trimester: Secondary | ICD-10-CM

## 2018-11-17 DIAGNOSIS — Z3A28 28 weeks gestation of pregnancy: Secondary | ICD-10-CM

## 2018-11-17 NOTE — Addendum Note (Signed)
Addended by: Scheryl Marten on: 11/17/2018 09:47 AM   Modules accepted: Orders

## 2018-11-17 NOTE — Patient Instructions (Signed)
Return to office for any scheduled appointments. Call the office or go to the [facility] at [facility] at  if:  You begin to have strong, frequent contractions  Your water breaks.  Sometimes it is a big gush of fluid, sometimes it is just a trickle that keeps getting your panties wet or running down your legs  You have vaginal bleeding.  It is normal to have a small amount of spotting if your cervix was checked.   You do not feel your baby moving like normal.  If you do not, get something to eat and drink and lay down and focus on feeling your baby move.   If your baby is still not moving like normal, you should call the office or go to MAU.  Any other obstetric concerns.   Third Trimester of Pregnancy The third trimester is from week 28 through week 40 (months 7 through 9). The third trimester is a time when the unborn baby (fetus) is growing rapidly. At the end of the ninth month, the fetus is about 20 inches in length and weighs 6-10 pounds. Body changes during your third trimester Your body will continue to go through many changes during pregnancy. The changes vary from woman to woman. During the third trimester:  Your weight will continue to increase. You can expect to gain 25-35 pounds (11-16 kg) by the end of the pregnancy.  You may begin to get stretch marks on your hips, abdomen, and breasts.  You may urinate more often because the fetus is moving lower into your pelvis and pressing on your bladder.  You may develop or continue to have heartburn. This is caused by increased hormones that slow down muscles in the digestive tract.  You may develop or continue to have constipation because increased hormones slow digestion and cause the muscles that push waste through your intestines to relax.  You may develop hemorrhoids. These are swollen veins (varicose veins) in the rectum that can itch or be painful.  You may develop swollen, bulging veins (varicose veins)  in your legs.  You may have increased body aches in the pelvis, back, or thighs. This is due to weight gain and increased hormones that are relaxing your joints.  You may have changes in your hair. These can include thickening of your hair, rapid growth, and changes in texture. Some women also have hair loss during or after pregnancy, or hair that feels dry or thin. Your hair will most likely return to normal after your baby is born.  Your breasts will continue to grow and they will continue to become tender. A yellow fluid (colostrum) may leak from your breasts. This is the first milk you are producing for your baby.  Your belly button may stick out.  You may notice more swelling in your hands, face, or ankles.  You may have increased tingling or numbness in your hands, arms, and legs. The skin on your belly may also feel numb.  You may feel short of breath because of your expanding uterus.  You may have more problems sleeping. This can be caused by the size of your belly, increased need to urinate, and an increase in your body's metabolism.  You may notice the fetus "dropping," or moving lower in your abdomen (lightening).  You may have increased vaginal discharge.  You may notice your joints feel loose and you may have pain around your pelvic bone. What to expect at prenatal visits You will have   prenatal exams every 2 weeks until week 36. Then you will have weekly prenatal exams. During a routine prenatal visit:  You will be weighed to make sure you and the baby are growing normally.  Your blood pressure will be taken.  Your abdomen will be measured to track your baby's growth.  The fetal heartbeat will be listened to.  Any test results from the previous visit will be discussed.  You may have a cervical check near your due date to see if your cervix has softened or thinned (effaced).  You will be tested for Group B streptococcus. This happens between 35 and 37 weeks. Your  health care provider may ask you:  What your birth plan is.  How you are feeling.  If you are feeling the baby move.  If you have had any abnormal symptoms, such as leaking fluid, bleeding, severe headaches, or abdominal cramping.  If you are using any tobacco products, including cigarettes, chewing tobacco, and electronic cigarettes.  If you have any questions. Other tests or screenings that may be performed during your third trimester include:  Blood tests that check for low iron levels (anemia).  Fetal testing to check the health, activity level, and growth of the fetus. Testing is done if you have certain medical conditions or if there are problems during the pregnancy.  Nonstress test (NST). This test checks the health of your baby to make sure there are no signs of problems, such as the baby not getting enough oxygen. During this test, a belt is placed around your belly. The baby is made to move, and its heart rate is monitored during movement. What is false labor? False labor is a condition in which you feel small, irregular tightenings of the muscles in the womb (contractions) that usually go away with rest, changing position, or drinking water. These are called Braxton Hicks contractions. Contractions may last for hours, days, or even weeks before true labor sets in. If contractions come at regular intervals, become more frequent, increase in intensity, or become painful, you should see your health care provider. What are the signs of labor?  Abdominal cramps.  Regular contractions that start at 10 minutes apart and become stronger and more frequent with time.  Contractions that start on the top of the uterus and spread down to the lower abdomen and back.  Increased pelvic pressure and dull back pain.  A watery or bloody mucus discharge that comes from the vagina.  Leaking of amniotic fluid. This is also known as your "water breaking." It could be a slow trickle or a gush.  Let your health care provider know if it has a color or strange odor. If you have any of these signs, call your health care provider right away, even if it is before your due date. Follow these instructions at home: Medicines  Follow your health care provider's instructions regarding medicine use. Specific medicines may be either safe or unsafe to take during pregnancy.  Take a prenatal vitamin that contains at least 600 micrograms (mcg) of folic acid.  If you develop constipation, try taking a stool softener if your health care provider approves. Eating and drinking   Eat a balanced diet that includes fresh fruits and vegetables, whole grains, good sources of protein such as meat, eggs, or tofu, and low-fat dairy. Your health care provider will help you determine the amount of weight gain that is right for you.  Avoid raw meat and uncooked cheese. These carry germs that   can cause birth defects in the baby.  If you have low calcium intake from food, talk to your health care provider about whether you should take a daily calcium supplement.  Eat four or five small meals rather than three large meals a day.  Limit foods that are high in fat and processed sugars, such as fried and sweet foods.  To prevent constipation: ? Drink enough fluid to keep your urine clear or pale yellow. ? Eat foods that are high in fiber, such as fresh fruits and vegetables, whole grains, and beans. Activity  Exercise only as directed by your health care provider. Most women can continue their usual exercise routine during pregnancy. Try to exercise for 30 minutes at least 5 days a week. Stop exercising if you experience uterine contractions.  Avoid heavy lifting.  Do not exercise in extreme heat or humidity, or at high altitudes.  Wear low-heel, comfortable shoes.  Practice good posture.  You may continue to have sex unless your health care provider tells you otherwise. Relieving pain and discomfort   Take frequent breaks and rest with your legs elevated if you have leg cramps or low back pain.  Take warm sitz baths to soothe any pain or discomfort caused by hemorrhoids. Use hemorrhoid cream if your health care provider approves.  Wear a good support bra to prevent discomfort from breast tenderness.  If you develop varicose veins: ? Wear support pantyhose or compression stockings as told by your healthcare provider. ? Elevate your feet for 15 minutes, 3-4 times a day. Prenatal care  Write down your questions. Take them to your prenatal visits.  Keep all your prenatal visits as told by your health care provider. This is important. Safety  Wear your seat belt at all times when driving.  Make a list of emergency phone numbers, including numbers for family, friends, the hospital, and police and fire departments. General instructions  Avoid cat litter boxes and soil used by cats. These carry germs that can cause birth defects in the baby. If you have a cat, ask someone to clean the litter box for you.  Do not travel far distances unless it is absolutely necessary and only with the approval of your health care provider.  Do not use hot tubs, steam rooms, or saunas.  Do not drink alcohol.  Do not use any products that contain nicotine or tobacco, such as cigarettes and e-cigarettes. If you need help quitting, ask your health care provider.  Do not use any medicinal herbs or unprescribed drugs. These chemicals affect the formation and growth of the baby.  Do not douche or use tampons or scented sanitary pads.  Do not cross your legs for long periods of time.  To prepare for the arrival of your baby: ? Take prenatal classes to understand, practice, and ask questions about labor and delivery. ? Make a trial run to the hospital. ? Visit the hospital and tour the maternity area. ? Arrange for maternity or paternity leave through employers. ? Arrange for family and friends to take  care of pets while you are in the hospital. ? Purchase a rear-facing car seat and make sure you know how to install it in your car. ? Pack your hospital bag. ? Prepare the baby's nursery. Make sure to remove all pillows and stuffed animals from the baby's crib to prevent suffocation.  Visit your dentist if you have not gone during your pregnancy. Use a soft toothbrush to brush your teeth and be   gentle when you floss. Contact a health care provider if:  You are unsure if you are in labor or if your water has broken.  You become dizzy.  You have mild pelvic cramps, pelvic pressure, or nagging pain in your abdominal area.  You have lower back pain.  You have persistent nausea, vomiting, or diarrhea.  You have an unusual or bad smelling vaginal discharge.  You have pain when you urinate. Get help right away if:  Your water breaks before 37 weeks.  You have regular contractions less than 5 minutes apart before 37 weeks.  You have a fever.  You are leaking fluid from your vagina.  You have spotting or bleeding from your vagina.  You have severe abdominal pain or cramping.  You have rapid weight loss or weight gain.  You have shortness of breath with chest pain.  You notice sudden or extreme swelling of your face, hands, ankles, feet, or legs.  Your baby makes fewer than 10 movements in 2 hours.  You have severe headaches that do not go away when you take medicine.  You have vision changes. Summary  The third trimester is from week 28 through week 40, months 7 through 9. The third trimester is a time when the unborn baby (fetus) is growing rapidly.  During the third trimester, your discomfort may increase as you and your baby continue to gain weight. You may have abdominal, leg, and back pain, sleeping problems, and an increased need to urinate.  During the third trimester your breasts will keep growing and they will continue to become tender. A yellow fluid (colostrum)  may leak from your breasts. This is the first milk you are producing for your baby.  False labor is a condition in which you feel small, irregular tightenings of the muscles in the womb (contractions) that eventually go away. These are called Braxton Hicks contractions. Contractions may last for hours, days, or even weeks before true labor sets in.  Signs of labor can include: abdominal cramps; regular contractions that start at 10 minutes apart and become stronger and more frequent with time; watery or bloody mucus discharge that comes from the vagina; increased pelvic pressure and dull back pain; and leaking of amniotic fluid. This information is not intended to replace advice given to you by your health care provider. Make sure you discuss any questions you have with your health care provider. Document Released: 07/07/2001 Document Revised: 08/18/2016 Document Reviewed: 08/18/2016 Elsevier Interactive Patient Education  2019 [employer]  

## 2018-11-18 LAB — CBC
Hematocrit: 33.1 % — ABNORMAL LOW (ref 34.0–46.6)
Hemoglobin: 10.9 g/dL — ABNORMAL LOW (ref 11.1–15.9)
MCH: 27.9 pg (ref 26.6–33.0)
MCHC: 32.9 g/dL (ref 31.5–35.7)
MCV: 85 fL (ref 79–97)
Platelets: 262 10*3/uL (ref 150–450)
RBC: 3.9 x10E6/uL (ref 3.77–5.28)
RDW: 13.3 % (ref 11.7–15.4)
WBC: 9.1 10*3/uL (ref 3.4–10.8)

## 2018-11-18 LAB — GLUCOSE TOLERANCE, 2 HOURS W/ 1HR
Glucose, 1 hour: 153 mg/dL (ref 65–179)
Glucose, 2 hour: 89 mg/dL (ref 65–152)
Glucose, Fasting: 77 mg/dL (ref 65–91)

## 2018-11-18 LAB — RPR: RPR Ser Ql: NONREACTIVE

## 2018-11-18 LAB — HIV ANTIBODY (ROUTINE TESTING W REFLEX): HIV Screen 4th Generation wRfx: NONREACTIVE

## 2018-12-14 ENCOUNTER — Ambulatory Visit (HOSPITAL_COMMUNITY)
Admission: RE | Admit: 2018-12-14 | Discharge: 2018-12-14 | Disposition: A | Discharge status: Home / Self Care | Payer: Medicaid Other | Source: Ambulatory Visit | Attending: Obstetrics and Gynecology | Admitting: Obstetrics and Gynecology

## 2018-12-14 ENCOUNTER — Ambulatory Visit (HOSPITAL_COMMUNITY): Payer: Medicaid Other

## 2018-12-14 ENCOUNTER — Other Ambulatory Visit: Payer: Self-pay

## 2018-12-14 ENCOUNTER — Encounter (HOSPITAL_COMMUNITY): Payer: Self-pay

## 2018-12-14 VITALS — BP 114/72 | HR 95 | Temp 98.7°F

## 2018-12-14 DIAGNOSIS — O99213 Obesity complicating pregnancy, third trimester: Secondary | ICD-10-CM

## 2018-12-14 DIAGNOSIS — Z362 Encounter for other antenatal screening follow-up: Secondary | ICD-10-CM | POA: Diagnosis not present

## 2018-12-14 DIAGNOSIS — E669 Obesity, unspecified: Secondary | ICD-10-CM | POA: Insufficient documentation

## 2018-12-14 DIAGNOSIS — Z6835 Body mass index (BMI) 35.0-35.9, adult: Secondary | ICD-10-CM | POA: Diagnosis not present

## 2018-12-14 DIAGNOSIS — Z3A32 32 weeks gestation of pregnancy: Secondary | ICD-10-CM

## 2018-12-15 ENCOUNTER — Encounter: Payer: Self-pay | Admitting: *Deleted

## 2018-12-15 ENCOUNTER — Ambulatory Visit (INDEPENDENT_AMBULATORY_CARE_PROVIDER_SITE_OTHER): Payer: Medicaid Other | Admitting: Obstetrics & Gynecology

## 2018-12-15 DIAGNOSIS — Z3A32 32 weeks gestation of pregnancy: Secondary | ICD-10-CM | POA: Diagnosis not present

## 2018-12-15 DIAGNOSIS — Z3483 Encounter for supervision of other normal pregnancy, third trimester: Secondary | ICD-10-CM | POA: Diagnosis not present

## 2018-12-15 NOTE — Progress Notes (Signed)
TELEHEALTH OBSTETRICS PRENATAL VIRTUAL VIDEO VISIT ENCOUNTER NOTE  Provider location: [facility]   I connected with Adrienne Citrin Conradt on 12/15/18 at  2:00 PM EDT by WebEx at home and verified that I am speaking with the correct person using two identifiers.   I discussed the limitations, risks, security and privacy concerns of performing an evaluation and management service by telephone and the availability of in person appointments. I also discussed with the patient that there may be a patient responsible charge related to this service. The patient expressed understanding and agreed to proceed. Subjective:  Adrienne Griffin is a 28 y.o. G3P2002 at [redacted]w[redacted]d being seen today for ongoing prenatal care.  She is currently monitored for the following issues for this low-risk pregnancy and has Obesity in pregnancy; Supervision of normal pregnancy; BMI 30-39.9; UTI in pregnancy; and Short interval between pregnancies affecting pregnancy, antepartum on their problem list.  Patient reports no complaints.  Contractions: Irritability. Vag. Bleeding: None.  Movement: Present. Denies any leaking of fluid.   The following portions of the patient's history were reviewed and updated as appropriate: allergies, current medications, past family history, past medical history, past social history, past surgical history and problem list.   Objective:   Vitals:   12/15/18 1533  Weight: 187 lb (84.8 kg)    Fetal Status:     Movement: Present     General:  Alert, oriented and cooperative. Patient is in no acute distress.  Respiratory: Normal respiratory effort, no problems with respiration noted  Mental Status: Normal mood and affect. Normal behavior. Normal judgment and thought content.  Rest of physical exam deferred due to type of encounter  Imaging: [place] Mfm Ob Follow Up  Result Date: 12/14/2018 ----------------------------------------------------------------------   OBSTETRICS REPORT                       (Signed Final 12/14/2018 02:29 pm) ---------------------------------------------------------------------- Patient Info  ID #:       [id number]                          D.O.B.:  Jan 15, 1991 (28 yrs)  Name:       Adrienne Griffin            Visit Date: 12/14/2018 01:01 pm ---------------------------------------------------------------------- Performed By  Performed By:     Rennie Plowman          Referred By:       Barbie Haggis  Attending:        Noralee Space MD        [facility] ---------------------------------------------------------------------- Orders   #  Description  Code         Ordered By   1  [place] MFM OB FOLLOW UP                  [id number]     Lin Landsman  ----------------------------------------------------------------------   #  Order #                    Accession #                 Episode #   1  [id number]                  [id number]                  [id number]  ---------------------------------------------------------------------- Indications   [redacted] weeks gestation of pregnancy                Z3A.32   Obesity complicating pregnancy, third          O99.213   trimester   Encounter for other antenatal screening        Z36.2   follow-up  ---------------------------------------------------------------------- Vital Signs  Weight (lb): 190                               Height:        4'11"  BMI:         38.37 ---------------------------------------------------------------------- Fetal Evaluation  Num Of Fetuses:          1  Fetal Heart Rate(bpm):   149  Cardiac Activity:        Observed  Presentation:            Cephalic  Placenta:                Posterior  P. Cord Insertion:       Previously Visualized  Amniotic Fluid  AFI FV:       Within normal limits  AFI Sum(cm)     %Tile       Largest Pocket(cm)  12.95           39          5.27  RUQ(cm)       RLQ(cm)       LUQ(cm)        LLQ(cm)  3.86          2.76          1.06           5.27 ---------------------------------------------------------------------- Biometry  BPD:      82.9  mm     G. Age:  33w 3d         80  %    CI:        72.94   %    70 - 86  FL/HC:       20.3  %    19.1 - 21.3  HC:      308.6  mm     G. Age:  34w 3d         77  %    HC/AC:       1.11       0.96 - 1.17  AC:      278.9  mm     G. Age:  32w 0d         47  %    FL/BPD:      75.4  %    71 - 87  FL:       62.5  mm     G. Age:  32w 2d         48  %    FL/AC:       22.4  %    20 - 24  HUM:      55.5  mm     G. Age:  32w 2d         56  %  LV:        4.8  mm  Est. FW:    1974   gm     4 lb 6 oz     65  % ---------------------------------------------------------------------- OB History  Gravidity:    3         Term:   2  Living:       2 ---------------------------------------------------------------------- Gestational Age  LMP:           32w 0d        Date:  05/04/18                 EDD:   02/08/19  U/S Today:     33w 0d                                        EDD:   02/01/19  Best:          32w 0d     Det. By:  LMP  (05/04/18)          EDD:   02/08/19 ---------------------------------------------------------------------- Anatomy  Cranium:               Appears normal         Aortic Arch:            Appears normal  Cavum:                 Appears normal         Ductal Arch:            Previously seen  Ventricles:            Appears normal         Diaphragm:              Appears normal  Choroid Plexus:        Appears normal         Stomach:                Appears normal, left  sided  Cerebellum:            Appears normal         Abdomen:                Appears normal  Posterior Fossa:       Appears normal          Abdominal Wall:         Previously seen  Nuchal Fold:           Previously seen        Cord Vessels:           Appears normal (3                                                                        vessel cord)  Face:                  Orbits and profile     Kidneys:                Appear normal                         previously seen  Lips:                  Appears normal         Bladder:                Appears normal  Thoracic:              Appears normal         Spine:                  Previously seen  Heart:                 Previously seen        Upper Extremities:      Previously seen  RVOT:                  Previously seen        Lower Extremities:      Previously seen  LVOT:                  Previously seen  Other:  Female gender. Heels and 5th digit previously visualized. Nasal bone          previously visualized. Feet previously visualized. ---------------------------------------------------------------------- Cervix Uterus Adnexa  Cervix  Not visualized (advanced GA >24wks)  Left Ovary  Within normal limits.  Right Ovary  Within normal limits. ---------------------------------------------------------------------- Impression  Maternal obesity.  Amniotic fluid is normal and good fetal activity is seen. Fetal  growth is appropriate for gestational age. ---------------------------------------------------------------------- Recommendations  Follow-up as clinically indicated. ----------------------------------------------------------------------                  Noralee Spaceavi Shankar, MD Electronically Signed Final Report   12/14/2018 02:29 pm ----------------------------------------------------------------------   Assessment and Plan:  Pregnancy: [id number] at 11079w1d 1. Encounter for supervision of other normal pregnancy in third trimester Ultrasound results reviewed. Advised to check BP and weight every week as directed, enter values into Babyscripts or send as MyChart message. Preterm labor  symptoms and  general obstetric precautions including but not limited to vaginal bleeding, contractions, leaking of fluid and fetal movement were reviewed in detail with the patient. I discussed the assessment and treatment plan with the patient. The patient was provided an opportunity to ask questions and all were answered. The patient agreed with the plan and demonstrated an understanding of the instructions. The patient was advised to call back or seek an in-person office evaluation/go to MAU at [facility] for any urgent or concerning symptoms. Please refer to After Visit Summary for other counseling recommendations.   Return in about 4 weeks (around 01/12/2019) for OFFICE OB Visit, Pelvic cultures.  No future appointments.  Jaynie Collins, MD [facility] [employer], [facility]

## 2018-12-15 NOTE — Patient Instructions (Signed)
Return to office for any scheduled appointments. Call the office or go to the [facility] at [facility] at [place] if:  You begin to have strong, frequent contractions  Your water breaks.  Sometimes it is a big gush of fluid, sometimes it is just a trickle that keeps getting your panties wet or running down your legs  You have vaginal bleeding.  It is normal to have a small amount of spotting if your cervix was checked.   You do not feel your baby moving like normal.  If you do not, get something to eat and drink and lay down and focus on feeling your baby move.   If your baby is still not moving like normal, you should call the office or go to MAU.  Any other obstetric concerns.   

## 2019-01-11 ENCOUNTER — Encounter: Payer: Medicaid Other | Admitting: Advanced Practice Midwife

## 2019-01-19 ENCOUNTER — Ambulatory Visit (INDEPENDENT_AMBULATORY_CARE_PROVIDER_SITE_OTHER): Payer: Medicaid Other | Admitting: Obstetrics and Gynecology

## 2019-01-19 ENCOUNTER — Other Ambulatory Visit (HOSPITAL_COMMUNITY)
Admission: RE | Admit: 2019-01-19 | Discharge: 2019-01-19 | Disposition: A | Discharge status: Home / Self Care | Payer: Medicaid Other | Source: Ambulatory Visit | Attending: Obstetrics and Gynecology | Admitting: Obstetrics and Gynecology

## 2019-01-19 ENCOUNTER — Other Ambulatory Visit: Payer: Self-pay

## 2019-01-19 VITALS — BP 102/70 | HR 72 | Wt 190.8 lb

## 2019-01-19 DIAGNOSIS — Z3480 Encounter for supervision of other normal pregnancy, unspecified trimester: Secondary | ICD-10-CM | POA: Diagnosis not present

## 2019-01-19 DIAGNOSIS — O2340 Unspecified infection of urinary tract in pregnancy, unspecified trimester: Secondary | ICD-10-CM

## 2019-01-19 DIAGNOSIS — O2343 Unspecified infection of urinary tract in pregnancy, third trimester: Secondary | ICD-10-CM

## 2019-01-19 DIAGNOSIS — Z3A37 37 weeks gestation of pregnancy: Secondary | ICD-10-CM

## 2019-01-19 NOTE — Progress Notes (Signed)
Prenatal Visit Note Date: 01/19/2019 Clinic: [facility]  Subjective:  Adrienne Griffin is a 28 y.o. G3P2002 at [redacted]w[redacted]d being seen today for ongoing prenatal care.  She is currently monitored for the following issues for this low-risk pregnancy and has Obesity in pregnancy; Supervision of normal pregnancy; BMI 30-39.9; UTI in pregnancy; and Short interval between pregnancies affecting pregnancy, antepartum on their problem list.  Patient reports no complaints.   Contractions: Not present. Vag. Bleeding: None.  Movement: Present. Denies leaking of fluid.   The following portions of the patient's history were reviewed and updated as appropriate: allergies, current medications, past family history, past medical history, past social history, past surgical history and problem list. Problem list updated.  Objective:   Vitals:   01/19/19 1114  BP: 102/70  Pulse: 72  Weight: 190 lb 12.8 oz (86.5 kg)    Fetal Status: Fetal Heart Rate (bpm): 147 Fundal Height: 38 cm Movement: Present  Presentation: Vertex  General:  Alert, oriented and cooperative. Patient is in no acute distress.  Skin: Skin is warm and dry. No rash noted.   Cardiovascular: Normal heart rate noted  Respiratory: Normal respiratory effort, no problems with respiration noted  Abdomen: Soft, gravid, appropriate for gestational age. Pain/Pressure: Present     Pelvic:  Cervical exam deferred        Extremities: Normal range of motion.  Edema: None  Mental Status: Normal mood and affect. Normal behavior. Normal judgment and thought content.   Urinalysis:      Assessment and Plan:  Pregnancy: G3P2002 at [redacted]w[redacted]d  1. Supervision of other normal pregnancy, antepartum Routine care - Strep Gp B NAA - Cervicovaginal ancillary only( [place])  Term labor symptoms and general obstetric precautions including but not limited to vaginal bleeding, contractions, leaking of fluid and fetal movement were reviewed in  detail with the patient. Please refer to After Visit Summary for other counseling recommendations.  Return in about 10 days (around 01/29/2019).   Aletha Halim, MD

## 2019-01-20 LAB — CERVICOVAGINAL ANCILLARY ONLY
Chlamydia: NEGATIVE
Neisseria Gonorrhea: NEGATIVE

## 2019-01-21 LAB — STREP GP B NAA: Strep Gp B NAA: NEGATIVE

## 2019-01-26 ENCOUNTER — Other Ambulatory Visit: Payer: Self-pay

## 2019-01-26 ENCOUNTER — Encounter: Payer: Self-pay | Admitting: Obstetrics & Gynecology

## 2019-01-26 ENCOUNTER — Telehealth (INDEPENDENT_AMBULATORY_CARE_PROVIDER_SITE_OTHER): Payer: Medicaid Other | Admitting: Obstetrics & Gynecology

## 2019-01-26 DIAGNOSIS — Z3483 Encounter for supervision of other normal pregnancy, third trimester: Secondary | ICD-10-CM

## 2019-01-26 DIAGNOSIS — Z3480 Encounter for supervision of other normal pregnancy, unspecified trimester: Secondary | ICD-10-CM

## 2019-01-26 DIAGNOSIS — Z3A38 38 weeks gestation of pregnancy: Secondary | ICD-10-CM

## 2019-01-26 NOTE — Progress Notes (Signed)
   TELEHEALTH OBSTETRICS PRENATAL VIRTUAL VIDEO VISIT ENCOUNTER NOTE  Provider location: Center for [place] at [facility]   I connected with Ezzard Standing Tomlin on 01/26/19 at  3:45 PM EDT by MyChart Video Encounter at home and verified that I am speaking with the correct person using two identifiers.   I discussed the limitations, risks, security and privacy concerns of performing an evaluation and management service by telephone and the availability of in person appointments. I also discussed with the patient that there may be a patient responsible charge related to this service. The patient expressed understanding and agreed to proceed. Subjective:  Adrienne Griffin is a 28 y.o. G3P2002 at [redacted]w[redacted]d being seen today for ongoing prenatal care.  She is currently monitored for the following issues for this low-risk pregnancy and has Obesity in pregnancy; Supervision of normal pregnancy; BMI 30-39.9; UTI in pregnancy; and Short interval between pregnancies affecting pregnancy, antepartum on their problem list.  Patient reports no complaints.  Contractions: Irritability. Vag. Bleeding: None.  Movement: Present. Denies any leaking of fluid.   The following portions of the patient's history were reviewed and updated as appropriate: allergies, current medications, past family history, past medical history, past social history, past surgical history and problem list.   Objective:  There were no vitals filed for this visit.  Fetal Status:     Movement: Present     General:  Alert, oriented and cooperative. Patient is in no acute distress.  Respiratory: Normal respiratory effort, no problems with respiration noted  Mental Status: Normal mood and affect. Normal behavior. Normal judgment and thought content.  Rest of physical exam deferred due to type of encounter  Imaging: No results found.  Assessment and Plan:  Pregnancy: G3P2002 at [redacted]w[redacted]d 1. Supervision of other normal pregnancy,  antepartum No concerns during this visit.  Term labor symptoms and general obstetric precautions including but not limited to vaginal bleeding, contractions, leaking of fluid and fetal movement were reviewed in detail with the patient. I discussed the assessment and treatment plan with the patient. The patient was provided an opportunity to ask questions and all were answered. The patient agreed with the plan and demonstrated an understanding of the instructions. The patient was advised to call back or seek an in-person office evaluation/go to MAU at [facility] for any urgent or concerning symptoms. Please refer to After Visit Summary for other counseling recommendations.   I provided 13 minutes of face-to-face time during this encounter.  Return in about 1 week (around 02/02/2019) for Virtual OB Visit             2 weeks: OFFICE OB Visit, NST, AFI.  No future appointments.  Verita Schneiders, MD Center for [employer], [place]

## 2019-01-26 NOTE — Patient Instructions (Signed)
Return to office for any scheduled appointments. Call the office or go to the MAU at [facility] [place] if:  You begin to have strong, frequent contractions  Your water breaks.  Sometimes it is a big gush of fluid, sometimes it is just a trickle that keeps getting your panties wet or running down your legs  You have vaginal bleeding.  It is normal to have a small amount of spotting if your cervix was checked.   You do not feel your baby moving like normal.  If you do not, get something to eat and drink and lay down and focus on feeling your baby move.   If your baby is still not moving like normal, you should call the office or go to MAU.  Any other obstetric concerns.   

## 2019-02-06 ENCOUNTER — Encounter (HOSPITAL_COMMUNITY): Payer: Self-pay | Admitting: *Deleted

## 2019-02-06 ENCOUNTER — Other Ambulatory Visit: Payer: Self-pay

## 2019-02-06 ENCOUNTER — Inpatient Hospital Stay (HOSPITAL_COMMUNITY): Payer: Medicaid Other | Admitting: Anesthesiology

## 2019-02-06 ENCOUNTER — Inpatient Hospital Stay (HOSPITAL_COMMUNITY)
Admission: AD | Admit: 2019-02-06 | Discharge: 2019-02-08 | DRG: 807 | Disposition: A | Discharge status: Home / Self Care | Payer: Medicaid Other | Attending: Obstetrics and Gynecology | Admitting: Obstetrics and Gynecology

## 2019-02-06 DIAGNOSIS — Z3A39 39 weeks gestation of pregnancy: Secondary | ICD-10-CM | POA: Diagnosis not present

## 2019-02-06 DIAGNOSIS — O99214 Obesity complicating childbirth: Secondary | ICD-10-CM | POA: Diagnosis present

## 2019-02-06 DIAGNOSIS — Z1159 Encounter for screening for other viral diseases: Secondary | ICD-10-CM

## 2019-02-06 DIAGNOSIS — O09899 Supervision of other high risk pregnancies, unspecified trimester: Secondary | ICD-10-CM

## 2019-02-06 DIAGNOSIS — E669 Obesity, unspecified: Secondary | ICD-10-CM | POA: Diagnosis present

## 2019-02-06 DIAGNOSIS — O2340 Unspecified infection of urinary tract in pregnancy, unspecified trimester: Secondary | ICD-10-CM | POA: Diagnosis present

## 2019-02-06 DIAGNOSIS — O26893 Other specified pregnancy related conditions, third trimester: Secondary | ICD-10-CM | POA: Diagnosis present

## 2019-02-06 LAB — CBC
HCT: 34.5 % — ABNORMAL LOW (ref 36.0–46.0)
Hemoglobin: 11 g/dL — ABNORMAL LOW (ref 12.0–15.0)
MCH: 26.6 pg (ref 26.0–34.0)
MCHC: 31.9 g/dL (ref 30.0–36.0)
MCV: 83.5 fL (ref 80.0–100.0)
Platelets: 279 10*3/uL (ref 150–400)
RBC: 4.13 MIL/uL (ref 3.87–5.11)
RDW: 14.9 % (ref 11.5–15.5)
WBC: 8.3 10*3/uL (ref 4.0–10.5)
nRBC: 0 % (ref 0.0–0.2)

## 2019-02-06 LAB — TYPE AND SCREEN
ABO/RH(D): O POS
Antibody Screen: NEGATIVE

## 2019-02-06 LAB — SARS CORONAVIRUS 2 BY RT PCR (HOSPITAL ORDER, PERFORMED IN ~~LOC~~ HOSPITAL LAB): SARS Coronavirus 2: NEGATIVE

## 2019-02-06 MED ORDER — DIPHENHYDRAMINE HCL 25 MG PO CAPS: 25.0000 mg | ORAL_CAPSULE | Freq: Four times a day (QID) | ORAL | Status: DC | PRN

## 2019-02-06 MED ORDER — LACTATED RINGERS IV SOLN: 500.0000 mL | INTRAVENOUS | Status: DC | PRN

## 2019-02-06 MED ORDER — LIDOCAINE HCL (PF) 1 % IJ SOLN: 30.0000 mL | INTRAMUSCULAR | Status: DC | PRN

## 2019-02-06 MED ORDER — FENTANYL CITRATE (PF) 100 MCG/2ML IJ SOLN
100.0000 ug | INTRAMUSCULAR | Status: DC | PRN
  Administered 2019-02-06: 100 ug via INTRAVENOUS
  Filled 2019-02-06: qty 2

## 2019-02-06 MED ORDER — PHENYLEPHRINE 40 MCG/ML (10ML) SYRINGE FOR IV PUSH (FOR BLOOD PRESSURE SUPPORT): 80.0000 ug | PREFILLED_SYRINGE | INTRAVENOUS | Status: DC | PRN

## 2019-02-06 MED ORDER — EPHEDRINE 5 MG/ML INJ: 10.0000 mg | INTRAVENOUS | Status: DC | PRN

## 2019-02-06 MED ORDER — PRENATAL MULTIVITAMIN CH
1.0000 | ORAL_TABLET | Freq: Every day | ORAL | Status: DC
  Administered 2019-02-07 – 2019-02-08 (×2): 1 via ORAL
  Filled 2019-02-06 (×2): qty 1

## 2019-02-06 MED ORDER — ONDANSETRON HCL 4 MG PO TABS: 4.0000 mg | ORAL_TABLET | ORAL | Status: DC | PRN

## 2019-02-06 MED ORDER — SODIUM CHLORIDE 0.9% FLUSH: 3.0000 mL | Freq: Two times a day (BID) | INTRAVENOUS | Status: DC

## 2019-02-06 MED ORDER — LACTATED RINGERS IV SOLN: INTRAVENOUS | Status: DC

## 2019-02-06 MED ORDER — DIBUCAINE (PERIANAL) 1 % EX OINT: 1.0000 "application " | TOPICAL_OINTMENT | CUTANEOUS | Status: DC | PRN

## 2019-02-06 MED ORDER — OXYCODONE-ACETAMINOPHEN 5-325 MG PO TABS: 1.0000 | ORAL_TABLET | ORAL | Status: DC | PRN

## 2019-02-06 MED ORDER — SOD CITRATE-CITRIC ACID 500-334 MG/5ML PO SOLN: 30.0000 mL | ORAL | Status: DC | PRN

## 2019-02-06 MED ORDER — ACETAMINOPHEN 325 MG PO TABS: 650.0000 mg | ORAL_TABLET | ORAL | Status: DC | PRN

## 2019-02-06 MED ORDER — LACTATED RINGERS IV SOLN: 500.0000 mL | Freq: Once | INTRAVENOUS | Status: DC

## 2019-02-06 MED ORDER — SENNOSIDES-DOCUSATE SODIUM 8.6-50 MG PO TABS
2.0000 | ORAL_TABLET | ORAL | Status: DC
  Administered 2019-02-06 – 2019-02-07 (×2): 2 via ORAL
  Filled 2019-02-06 (×2): qty 2

## 2019-02-06 MED ORDER — FENTANYL-BUPIVACAINE-NACL 0.5-0.125-0.9 MG/250ML-% EP SOLN
12.0000 mL/h | EPIDURAL | Status: DC | PRN
  Filled 2019-02-06: qty 250

## 2019-02-06 MED ORDER — OXYTOCIN BOLUS FROM INFUSION: 500.0000 mL | Freq: Once | INTRAVENOUS | Status: DC

## 2019-02-06 MED ORDER — ONDANSETRON HCL 4 MG/2ML IJ SOLN: 4.0000 mg | INTRAMUSCULAR | Status: DC | PRN

## 2019-02-06 MED ORDER — WITCH HAZEL-GLYCERIN EX PADS: 1.0000 "application " | MEDICATED_PAD | CUTANEOUS | Status: DC | PRN

## 2019-02-06 MED ORDER — SIMETHICONE 80 MG PO CHEW: 80.0000 mg | CHEWABLE_TABLET | ORAL | Status: DC | PRN

## 2019-02-06 MED ORDER — SODIUM CHLORIDE 0.9% FLUSH: 3.0000 mL | INTRAVENOUS | Status: DC | PRN

## 2019-02-06 MED ORDER — COCONUT OIL OIL: 1.0000 "application " | TOPICAL_OIL | Status: DC | PRN

## 2019-02-06 MED ORDER — MEASLES, MUMPS & RUBELLA VAC IJ SOLR: 0.5000 mL | Freq: Once | INTRAMUSCULAR | Status: DC

## 2019-02-06 MED ORDER — SODIUM CHLORIDE 0.9 % IV SOLN: 250.0000 mL | INTRAVENOUS | Status: DC | PRN

## 2019-02-06 MED ORDER — TETANUS-DIPHTH-ACELL PERTUSSIS 5-2.5-18.5 LF-MCG/0.5 IM SUSP: 0.5000 mL | Freq: Once | INTRAMUSCULAR | Status: DC

## 2019-02-06 MED ORDER — OXYTOCIN 40 UNITS IN NORMAL SALINE INFUSION - SIMPLE MED
2.5000 [IU]/h | INTRAVENOUS | Status: DC
  Filled 2019-02-06: qty 1000

## 2019-02-06 MED ORDER — ONDANSETRON HCL 4 MG/2ML IJ SOLN: 4.0000 mg | Freq: Four times a day (QID) | INTRAMUSCULAR | Status: DC | PRN

## 2019-02-06 MED ORDER — FENTANYL-BUPIVACAINE-NACL 0.5-0.125-0.9 MG/250ML-% EP SOLN: 12.0000 mL/h | EPIDURAL | Status: DC | PRN

## 2019-02-06 MED ORDER — DIPHENHYDRAMINE HCL 50 MG/ML IJ SOLN: 12.5000 mg | INTRAMUSCULAR | Status: DC | PRN

## 2019-02-06 MED ORDER — BENZOCAINE-MENTHOL 20-0.5 % EX AERO: 1.0000 "application " | INHALATION_SPRAY | CUTANEOUS | Status: DC | PRN

## 2019-02-06 MED ORDER — ZOLPIDEM TARTRATE 5 MG PO TABS: 5.0000 mg | ORAL_TABLET | Freq: Every evening | ORAL | Status: DC | PRN

## 2019-02-06 MED ORDER — PHENYLEPHRINE 40 MCG/ML (10ML) SYRINGE FOR IV PUSH (FOR BLOOD PRESSURE SUPPORT)
80.0000 ug | PREFILLED_SYRINGE | INTRAVENOUS | Status: DC | PRN
  Filled 2019-02-06: qty 10

## 2019-02-06 MED ORDER — LIDOCAINE-EPINEPHRINE 1 %-1:100000 IJ SOLN
INTRAMUSCULAR | Status: DC | PRN
  Administered 2019-02-06: 6 mL via INTRADERMAL

## 2019-02-06 MED ORDER — SODIUM CHLORIDE (PF) 0.9 % IJ SOLN
INTRAMUSCULAR | Status: DC | PRN
  Administered 2019-02-06: 12 mL/h via EPIDURAL

## 2019-02-06 MED ORDER — IBUPROFEN 600 MG PO TABS
600.0000 mg | ORAL_TABLET | Freq: Four times a day (QID) | ORAL | Status: DC
  Administered 2019-02-06 – 2019-02-08 (×7): 600 mg via ORAL
  Filled 2019-02-06 (×7): qty 1

## 2019-02-06 MED ORDER — OXYCODONE-ACETAMINOPHEN 5-325 MG PO TABS: 2.0000 | ORAL_TABLET | ORAL | Status: DC | PRN

## 2019-02-06 NOTE — Discharge Instructions (Signed)

## 2019-02-06 NOTE — Anesthesia Postprocedure Evaluation (Signed)
Anesthesia Post Note  Patient: Adrienne Griffin  Procedure(s) Performed: AN AD [place]     Patient location during evaluation: Mother Baby Anesthesia Type: Epidural Level of consciousness: awake and alert Pain management: pain level controlled Vital Signs Assessment: post-procedure vital signs reviewed and stable Respiratory status: spontaneous breathing, nonlabored ventilation and respiratory function stable Cardiovascular status: stable Postop Assessment: no headache, no backache and epidural receding Anesthetic complications: no    Last Vitals:  Vitals:   02/06/19 2146 02/06/19 2215  BP: 118/82 115/78  Pulse: 84 84  Resp: 18 20  Temp:  37.1 C    Last Pain:  Vitals:   02/06/19 2215  TempSrc: Oral  PainSc: 0-No pain   Pain Goal:                   Apryl Brymer

## 2019-02-06 NOTE — MAU Note (Signed)
Pt reports ctx since 7. Now about 5-7 min apart and stronger. Denies any vag bleeding or leaking. Good fetal movement felt.

## 2019-02-06 NOTE — Anesthesia Preprocedure Evaluation (Signed)
Anesthesia Evaluation  Patient identified by MRN, date of birth, ID band Patient awake    Reviewed: Allergy & Precautions, H&P , NPO status , Patient's Chart, lab work & pertinent test results, reviewed documented beta blocker date and time   Airway Mallampati: II  TM Distance: >3 FB Neck ROM: full    Dental no notable dental hx.    Pulmonary neg pulmonary ROS,    Pulmonary exam normal breath sounds clear to auscultation       Cardiovascular negative cardio ROS Normal cardiovascular exam Rhythm:regular Rate:Normal     Neuro/Psych negative neurological ROS  negative psych ROS   GI/Hepatic negative GI ROS, Neg liver ROS,   Endo/Other  Morbid obesity  Renal/GU negative Renal ROS  negative genitourinary   Musculoskeletal   Abdominal   Peds  Hematology negative hematology ROS (+)   Anesthesia Other Findings   Reproductive/Obstetrics (+) Pregnancy                             Anesthesia Physical Anesthesia Plan  ASA: III  Anesthesia Plan: Epidural   Post-op Pain Management:    Induction:   PONV Risk Score and Plan:   Airway Management Planned:   Additional Equipment:   Intra-op Plan:   Post-operative Plan:   Informed Consent: I have reviewed the patients History and Physical, chart, labs and discussed the procedure including the risks, benefits and alternatives for the proposed anesthesia with the patient or authorized representative who has indicated his/her understanding and acceptance.       Plan Discussed with:   Anesthesia Plan Comments:         Anesthesia Quick Evaluation  

## 2019-02-06 NOTE — H&P (Addendum)
**Note Adrienne-Identified via Obfuscation** LABOR AND DELIVERY ADMISSION HISTORY AND PHYSICAL NOTE  Adrienne Griffin is a 28 y.o. female G3P2002 with IUP at [redacted]w[redacted]d by LMP presenting for spontaneous labor.  She reports positive fetal movement. She denies leakage of fluid or vaginal bleeding.  Prenatal History/Complications: PNC at [facility] Pregnancy complications:  - Obesity -short intervals between pregnancies  -h/o UTI during pregnancy   Past Medical History: Past Medical History:  Diagnosis Date  . Medical history non-contributory     Past Surgical History: Past Surgical History:  Procedure Laterality Date  . WISDOM TOOTH EXTRACTION  2010    Obstetrical History: OB History    Gravida  3   Para  2   Term  2   Preterm      AB      Living  2     SAB      TAB      Ectopic      Multiple  0   Live Births  2           Social History: Social History   Socioeconomic History  . Marital status: Married    Spouse name: Not on file  . Number of children: Not on file  . Years of education: Not on file  . Highest education level: Not on file  Occupational History  . Not on file  Social Needs  . Financial resource strain: Not on file  . Food insecurity    Worry: Not on file    Inability: Not on file  . Transportation needs    Medical: Not on file    Non-medical: Not on file  Tobacco Use  . Smoking status: Never Smoker  . Smokeless tobacco: Never Used  Substance and Sexual Activity  . Alcohol use: Not Currently    Comment: occasional pre-pregnancy  . Drug use: No  . Sexual activity: Yes    Partners: Male    Birth control/protection: None  Lifestyle  . Physical activity    Days per week: Not on file    Minutes per session: Not on file  . Stress: Not on file  Relationships  . Social Herbalist on phone: Not on file    Gets together: Not on file    Attends religious service: Not on file    Active member of club or organization: Not on file    Attends meetings of clubs  or organizations: Not on file    Relationship status: Not on file  Other Topics Concern  . Not on file  Social History Narrative  . Not on file    Family History: Family History  Problem Relation Age of Onset  . Hypertension Mother   . Diabetes Mother     Allergies: No Known Allergies  Medications Prior to Admission  Medication Sig Dispense Refill Last Dose  . Prenat w/o A Vit-FeFum-FePo-FA (CONCEPT OB) 130-92.4-1 MG CAPS Take 1 tablet by mouth daily. 30 capsule 12   . pyridOXINE (VITAMIN B-6) 100 MG tablet Take 1 tablet (100 mg total) by mouth 2 (two) times daily as needed (nausea and vomiting). (Patient not taking: Reported on 07/28/2018) 60 tablet 2    Review of Systems  All systems reviewed and negative except as stated in HPI  Physical Exam Blood pressure 121/70, pulse 88, temperature 98.6 F (37 C), temperature source Oral, resp. rate 16, last menstrual period 05/04/2018, not currently breastfeeding. General appearance: Alert, oriented, NAD Lungs: Normal respiratory effort Heart: Regular rate Abdomen:  Soft, non-tender; gravid, FH appropriate for GA Extremities: No calf swelling or tenderness Presentation: Cephalic by SVE Fetal monitoring: Baseline HR 150, moderate variability, +accels, -decels Uterine activity: Contractions every 3-4 minutes Dilation: 6 Effacement (%): 80 Station: -2 Exam by:: lee  Prenatal labs: ABO, Rh: --/--/O POS (07/13 1748) Antibody: NEG (07/13 1748) Rubella: 1.47 (01/02 1602) RPR: Non Reactive (04/23 0823)  HBsAg: Negative (01/02 1602)  HIV: Non Reactive (04/23 0823)  GC/Chlamydia: Negative GBS: Negative (06/25 1126)  2-hr GTT: 89 Genetic screening: Declined Anatomy US: Normal growth; placenta posterior  Prenatal Transfer Tool  Maternal Diabetes: No Genetic Screening: Declined Maternal Ultrasounds/Referrals: Normal Fetal Ultrasounds or other Referrals:  None Maternal Substance Abuse:  No Significant Maternal Medications:   None Significant Maternal Lab Results: Group B Strep negative  Results for orders placed or performed during the hospital encounter of 02/06/19 (from the past 24 hour(s))  SARS Coronavirus 2 (CEPHEID - Performed in [facility] hospital lab), [facility] Order   Collection Time: 02/06/19  5:36 PM   Specimen: Nasopharyngeal Swab  Result Value Ref Range   SARS Coronavirus 2 NEGATIVE NEGATIVE  CBC   Collection Time: 02/06/19  5:36 PM  Result Value Ref Range   WBC 8.3 4.0 - 10.5 K/uL   RBC 4.13 3.87 - 5.11 MIL/uL   Hemoglobin 11.0 (L) 12.0 - 15.0 g/dL   HCT 14.734.5 (L) 82.936.0 - 56.246.0 %   MCV 83.5 80.0 - 100.0 fL   MCH 26.6 26.0 - 34.0 pg   MCHC 31.9 30.0 - 36.0 g/dL   RDW 13.014.9 86.511.5 - 78.415.5 %   Platelets 279 150 - 400 K/uL   nRBC 0.0 0.0 - 0.2 %  Type and screen [facility]   Collection Time: 02/06/19  5:48 PM  Result Value Ref Range   ABO/RH(D) O POS    Antibody Screen NEG    Sample Expiration      02/09/2019,2359 Performed at [facility], [address] [place], [place] [address]     Patient Active Problem List   Diagnosis Date Noted  . Indication for care in labor or delivery 02/06/2019  . Supervision of normal pregnancy 07/28/2018  . BMI 30-39.9 07/28/2018  . UTI in pregnancy 07/28/2018  . Short interval between pregnancies affecting pregnancy, antepartum 07/28/2018  . Obesity in pregnancy 04/24/2015    Assessment: Adrienne Griffin is a 28 y.o. G3P2002 at 6362w5d here for spontaneous labor.  #Labor: Normal labor with regular contractions and cervical change, will continue to monitor and augment as necessary. Expectant management for now.  #Pain: Epidural #FWB:  Category 1 tracing #ID: GBS negative; no antibiotics needed #MOF:  Bottle  #MOC:  Unsure; considering OCPs #Circ:  Desired  Adrienne Griffin 02/06/2019, 7:21 PM  OB FELLOW HISTORY AND PHYSICAL ATTESTATION  I have seen and examined this patient; I agree with above documentation in the  resident's note.   Adrienne Griffin, D.O. OB Fellow  02/06/2019, 7:22 PM

## 2019-02-06 NOTE — Discharge Summary (Signed)
Postpartum Discharge Summary     Patient Name: Adrienne BruceCharlotte Konte Griffin DOB: 12/04/1990 MRN: [id number]  Date of admission: 02/06/2019 Delivering Provider: Allayne StackBEARD, SAMANTHA N   Date of discharge: 02/08/2019  Admitting diagnosis: CTX 5 MIN Intrauterine pregnancy: 2431w5d     Secondary diagnosis:  Active Problems:   BMI 30-39.9   UTI in pregnancy   Short interval between pregnancies affecting pregnancy, antepartum   Indication for care in labor or delivery  Additional problems: none     Discharge diagnosis: Term Pregnancy Delivered                                                                                                Post partum procedures:None  Augmentation: None  Complications: None  Hospital course:  Onset of Labor With Vaginal Delivery     28 y.o. yo G3P2002 at 5631w5d was admitted in Active Labor on 02/06/2019. Patient had an uncomplicated labor course as follows:  Membrane Rupture Time/Date: 7:49 PM ,02/06/2019   Intrapartum Procedures: Episiotomy: None [1]                                         Lacerations:  None [1]  Patient had a delivery of a Viable infant. 02/06/2019  Information for the patient's newborn:  Annalee GentaOtim, Boy Giuseppina [[id number]][[id number]]  Delivery Method: Vaginal, Spontaneous(Filed from Delivery Summary)     Pateint had an uncomplicated postpartum course.  She is ambulating, tolerating a regular diet, passing flatus, and urinating well. Patient is discharged home in stable condition on 02/08/19.   Magnesium Sulfate recieved: No BMZ received: No  Physical exam  Vitals:   02/07/19 0800 02/07/19 1137 02/07/19 1404 02/07/19 2351  BP: 111/75 96/62 114/72 114/74  Pulse: 82 84 67 72  Resp: 16 16 18    Temp: 98.4 F (36.9 C) 98.6 F (37 C) 98 F (36.7 C) 98.2 F (36.8 C)  TempSrc: Oral Oral Oral   SpO2: 100% 100% 98% 100%   General: alert, cooperative and no distress Lochia: appropriate Uterine Fundus: firm Incision: N/A DVT Evaluation: No evidence of DVT  seen on physical exam. Negative Homan's sign. No cords or calf tenderness. No significant calf/ankle edema. Labs: Lab Results  Component Value Date   WBC 8.3 02/06/2019   HGB 11.0 (L) 02/06/2019   HCT 34.5 (L) 02/06/2019   MCV 83.5 02/06/2019   PLT 279 02/06/2019   CMP Latest Ref Rng & Units 07/28/2018  Glucose 65 - 99 mg/dL 85  BUN 6 - 20 mg/dL 7  Creatinine 7.820.57 - 9.561.00 mg/dL 2.13(Y0.46(L)  Sodium 865134 - 784144 mmol/L 135  Potassium 3.5 - 5.2 mmol/L 3.8  Chloride 96 - 106 mmol/L 104  CO2 20 - 29 mmol/L 17(L)  Calcium 8.7 - 10.2 mg/dL 8.7  Total Protein 6.0 - 8.5 g/dL 6.6  Total Bilirubin 0.0 - 1.2 mg/dL <6.9<0.2  Alkaline Phos 39 - 117 IU/L 52  AST 0 - 40 IU/L 9  ALT 0 - 32 IU/L 9  Discharge instruction: per After Visit Summary and "Baby and Me Booklet".  After visit meds:  Allergies as of 02/08/2019   No Known Allergies     Medication List    STOP taking these medications   pyridOXINE 100 MG tablet Commonly known as: VITAMIN B-6     TAKE these medications   Concept OB 130-92.4-1 MG Caps Take 1 tablet by mouth daily.   ibuprofen 600 MG tablet Commonly known as: ADVIL Take 1 tablet (600 mg total) by mouth every 6 (six) hours as needed for mild pain, moderate pain or cramping.       Diet: routine diet  Activity: Advance as tolerated. Pelvic rest for 6 weeks.   Outpatient follow up:8/11 as scheduled Follow up Appt: Future Appointments  Date Time Provider [place]  03/07/2019  1:15 PM Donnamae Jude, MD [facility]   Follow up Visit: [place] for [place] at [facility] Follow up.   Specialty: Obstetrics and Gynecology Why: 8/11 as scheduled for postpartum visit  Contact information: [place] [address] [phone]           Newborn Data: Live born female  Birth Weight:  7lb 11.1oz/3490g APGAR: 34, 9  Newborn Delivery   Birth date/time: 02/06/2019 20:24:00 Delivery  type: Vaginal, Spontaneous      Baby Feeding: Bottle Disposition:rooming in, on bili lights, nurse checking to see if can do inpatient (has preg mcaid secondary)   02/08/2019 Roma Schanz, CNM

## 2019-02-06 NOTE — Anesthesia Procedure Notes (Signed)
Epidural Patient location during procedure: OB Start time: 02/06/2019 6:25 PM End time: 02/06/2019 6:30 PM  Staffing Anesthesiologist: Janeece Riggers, MD  Preanesthetic Checklist Completed: patient identified, site marked, surgical consent, pre-op evaluation, timeout performed, IV checked, risks and benefits discussed and monitors and equipment checked  Epidural Patient position: sitting Prep: site prepped and draped and DuraPrep Patient monitoring: continuous pulse ox and blood pressure Approach: midline Location: L3-L4 Injection technique: LOR air  Needle:  Needle type: Tuohy  Needle gauge: 17 G Needle length: 9 cm and 9 Needle insertion depth: 5 cm cm Catheter type: closed end flexible Catheter size: 19 Gauge Catheter at skin depth: 10 cm Test dose: negative  Assessment Events: blood not aspirated, injection not painful, no injection resistance, negative IV test and no paresthesia

## 2019-02-07 LAB — ABO/RH: ABO/RH(D): O POS

## 2019-02-07 LAB — RPR: RPR Ser Ql: NONREACTIVE

## 2019-02-07 NOTE — Anesthesia Postprocedure Evaluation (Signed)
Anesthesia Post Note  Patient: Adrienne Griffin  Procedure(s) Performed: AN AD [place]     Patient location during evaluation: Mother Baby Anesthesia Type: Epidural Level of consciousness: awake and alert Pain management: pain level controlled Vital Signs Assessment: post-procedure vital signs reviewed and stable Respiratory status: spontaneous breathing, nonlabored ventilation and respiratory function stable Cardiovascular status: stable Postop Assessment: no headache, no backache and epidural receding Anesthetic complications: no    Last Vitals:  Vitals:   02/06/19 2320 02/07/19 0400  BP: 120/89 124/78  Pulse: 86 80  Resp: 18 16  Temp: 37.2 C 37 C    Last Pain:  Vitals:   02/07/19 0615  TempSrc:   PainSc: 0-No pain   Pain Goal:                   Nayvie Lips

## 2019-02-07 NOTE — Lactation Note (Signed)
This note was copied from a baby's chart. Lactation Consultation Note Mom having difficulty formula feeding baby. Baby keeps pushing nipple out of mouth. Chewing on nipple. LC assisted in feeding baby w/slow flow nipple holding baby upright baby done much better. Noted baby has labial frenulum to gum line and tight frenulum tongue tie. Suck training w/gloved finger to extend tongue under finger to suck. Baby would start suckling on nipple then chew. Uncoordinated suck swallow. With much patience baby took 20 ml Gerber formula. Discussed pace feeding. Mom has 3 and 34 yr old. Patient Name: Adrienne Griffin [name] [id number] Date: 02/07/2019     Maternal Data    Feeding Feeding Type: Bottle Fed - Formula Nipple Type: Slow - flow  LATCH Score Latch: Repeated attempts needed to sustain latch, nipple held in mouth throughout feeding, stimulation needed to elicit sucking reflex.  Audible Swallowing: None  Type of Nipple: Everted at rest and after stimulation  Comfort (Breast/Nipple): Soft / non-tender  Hold (Positioning): Assistance needed to correctly position infant at breast and maintain latch.  LATCH Score: 6  Interventions Interventions: Position options;Support pillows;Assisted with latch;Breast feeding basics reviewed;Hand express;Breast compression;Adjust position;Skin to skin  Lactation Tools Discussed/Used     Consult Status      Theodoro Kalata 02/07/2019, 12:42 AM

## 2019-02-07 NOTE — Lactation Note (Addendum)
This note was copied from a baby's chart. Lactation Consultation Note Baby 3 hrs old. Mom has decided to just formula feed. She stated she tried w/her other 2 children and it is to stressful for her. Baby was crying while LC was in room. Engorgement management discussed. Baby is +DAT bili elevated, going TPT now. Mom is stressed.  Patient Name: Adrienne Griffin [id number] Date: 02/07/2019     Maternal Data    Feeding Feeding Type: Breast Fed  LATCH Score Latch: Repeated attempts needed to sustain latch, nipple held in mouth throughout feeding, stimulation needed to elicit sucking reflex.  Audible Swallowing: None  Type of Nipple: Everted at rest and after stimulation  Comfort (Breast/Nipple): Soft / non-tender  Hold (Positioning): Assistance needed to correctly position infant at breast and maintain latch.  LATCH Score: 6  Interventions Interventions: Position options;Support pillows;Assisted with latch;Breast feeding basics reviewed;Hand express;Breast compression;Adjust position;Skin to skin  Lactation Tools Discussed/Used     Consult Status      Theodoro Kalata 02/07/2019, 12:13 AM

## 2019-02-07 NOTE — Progress Notes (Signed)
Post Partum Day 1: SVD over intact perineum 01/1319 at 2024 Subjective: no complaints, up ad lib, voiding, tolerating PO and + flatus  Objective: Blood pressure 124/78, pulse 80, temperature 98.6 F (37 C), temperature source Oral, resp. rate 16, last menstrual period 05/04/2018, unknown if currently breastfeeding.  Physical Exam:  General: alert, cooperative, appears stated age and no distress Lochia: appropriate Uterine Fundus: firm Incision: N/A DVT Evaluation: No evidence of DVT seen on physical exam.  Recent Labs    02/06/19 1736  HGB 11.0*  HCT 34.5*    Assessment/Plan: Plan for discharge tomorrow   LOS: 1 day   Darlina Rumpf , [place] 02/07/2019, [phone]

## 2019-02-07 NOTE — Addendum Note (Signed)
Addendum  created 02/07/19 0835 by Ignacia Bayley, CRNA   Clinical Note Signed

## 2019-02-08 MED ORDER — IBUPROFEN 600 MG PO TABS: 600.0000 mg | ORAL_TABLET | Freq: Four times a day (QID) | ORAL | 0 refills | Status: DC | PRN

## 2019-02-09 ENCOUNTER — Ambulatory Visit: Payer: Self-pay

## 2019-02-09 NOTE — Lactation Note (Signed)
This note was copied from a baby's chart. Lactation Consultation Note Baby is 7 hrs old on triple photo therapy. Mom decided before baby went on photo therapy after delivery that she was going to formula feed instead of BF. LC noticed mom isn't giving baby enough formula according to hours of age. Spoke w/mom to increase amount of formula. Baby needs 30-60 ml each feeding. Mom stated OK.  Patient Name: Adrienne Griffin [id number] Date: 02/09/2019     Maternal Data    Feeding Feeding Type: Bottle Fed - Formula  LATCH Score                   Interventions    Lactation Tools Discussed/Used     Consult Status      Sherryann Frese G 02/09/2019, 4:29 AM

## 2019-03-07 ENCOUNTER — Encounter: Payer: Self-pay | Admitting: Family Medicine

## 2019-03-07 ENCOUNTER — Other Ambulatory Visit: Payer: Self-pay

## 2019-03-07 ENCOUNTER — Ambulatory Visit: Payer: Medicaid Other | Admitting: Family Medicine

## 2019-03-07 NOTE — Progress Notes (Signed)
Patient did not keep appointment today. She will be called to reschedule.  

## 2019-03-07 NOTE — Progress Notes (Deleted)
Post Partum Exam  Adrienne Griffin is a 28 y.o. G3P3003 female who presents for a postpartum visit. She is {1-10:13787} {time; units:18646} postpartum following a {delivery:12449}. I have fully reviewed the prenatal and intrapartum course. The delivery was at 39.5  gestational weeks.  Anesthesia: {anesthesia types:812}. Postpartum course has been ***. Baby's course has been uncomplicated. Baby is feeding by breast. Bleeding. Bowel function is {normal:32111}. Bladder function is {normal:32111}. Patient {is/is not:9024} sexually active. Contraception method is {contraceptive method:5051}. Postpartum depression screening:neg  {Common ambulatory SmartLinks:19316} Last pap smear done *** and was {Desc; normal/abnormal:11317::"Normal"}  Review of Systems {ros; complete:30496}    Objective:  unknown if currently breastfeeding.  General:  {gen appearance:16600}   Breasts:  {breast exam:1202::"inspection negative, no nipple discharge or bleeding, no masses or nodularity palpable"}  Lungs: {lung exam:16931}  Heart:  {heart exam:5510}  Abdomen: {abdomen exam:16834}   Vulva:  {labia exam:12198}  Vagina: {vagina exam:12200}  Cervix:  {cervix exam:14595}  Corpus: {uterus exam:12215}  Adnexa:  {adnexa exam:12223}  Rectal Exam: {rectal/vaginal exam:12274}        Assessment:    *** postpartum exam. Pap smear {done:10129} at today's visit.   Plan:   1. Contraception: {method:5051} 2. *** 3. Follow up in: {1-10:13787} {time; units:19136} or as needed.

## 2019-03-11 IMAGING — US US MFM OB DETAIL+14 WK
1 series · 14 of 28 positions shown · non-contrast
Comparison: none

[Series 1: us mfm ob detail+14 wk · 140 acquisitions, 14 frames shown]
[im 6/140]
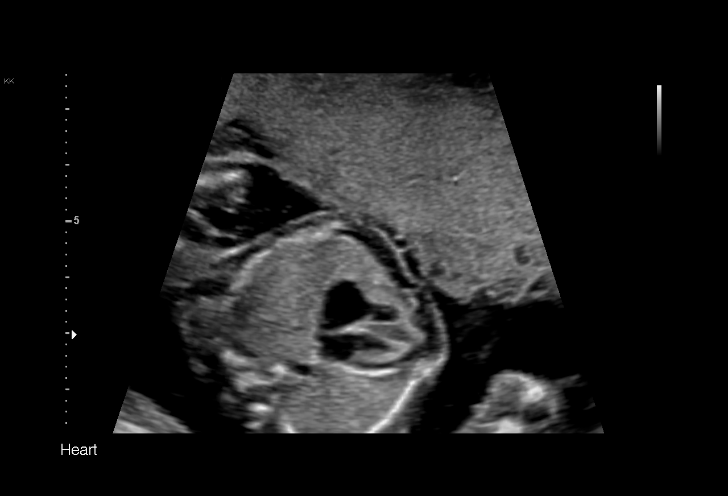
[im 16/140]
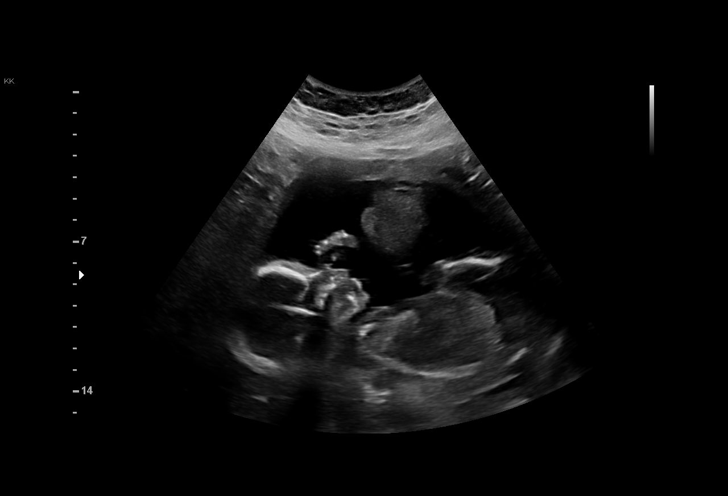
[im 26/140]
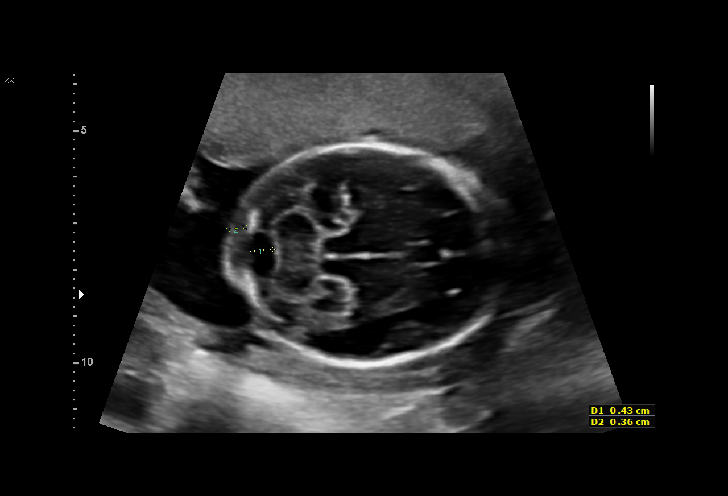
[im 37/140]
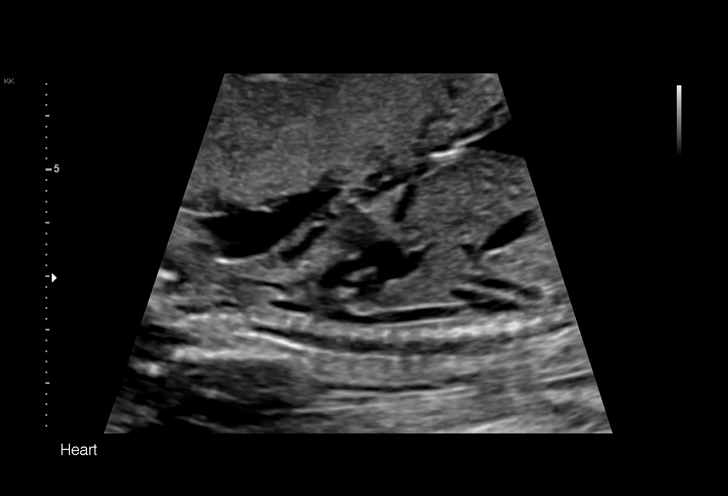
[im 47/140]
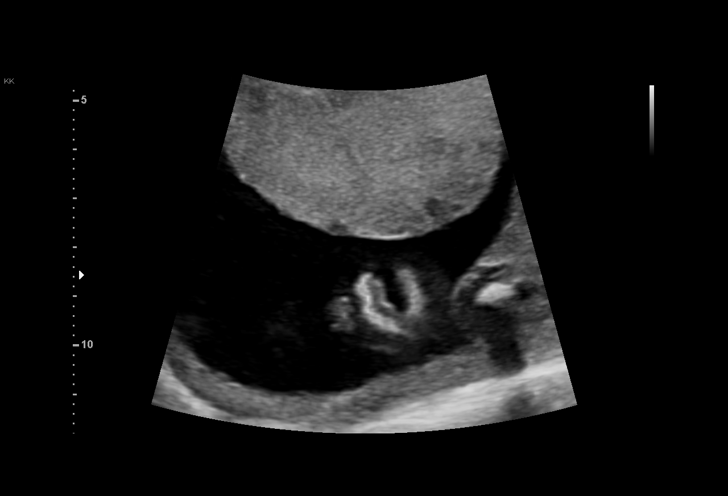
[im 57/140]
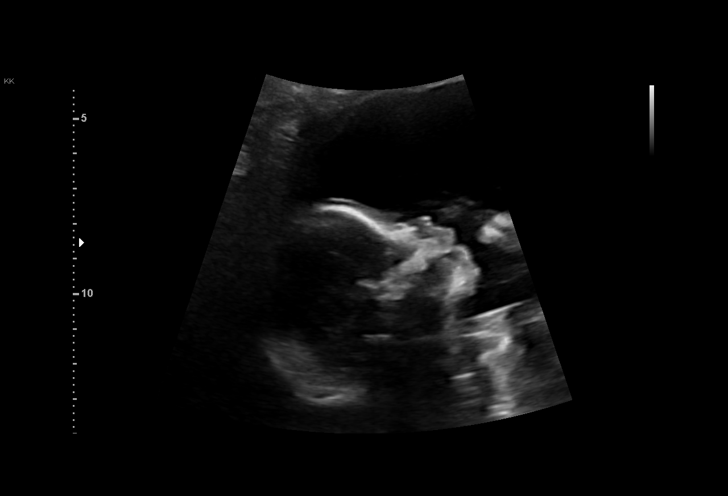
[im 67/140]
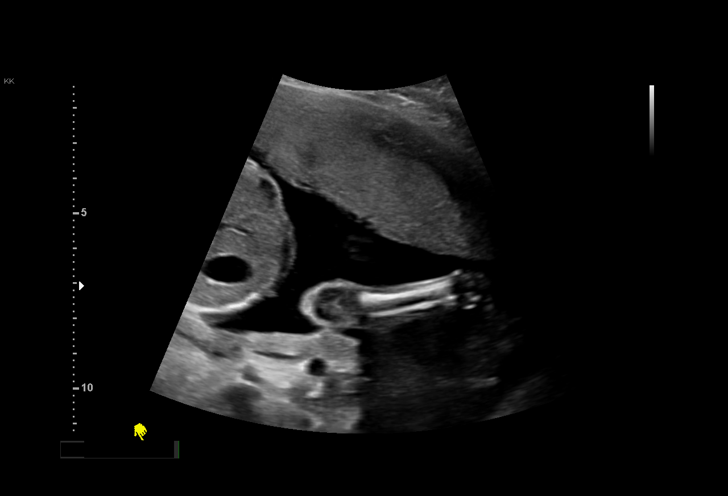
[im 78/140]
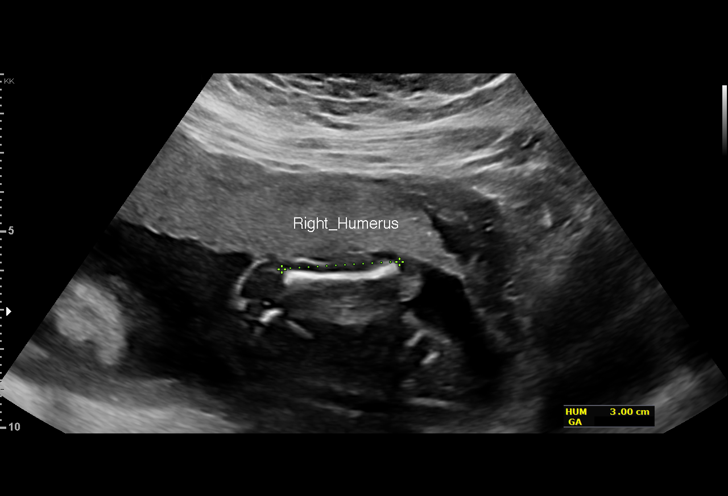
[im 88/140]
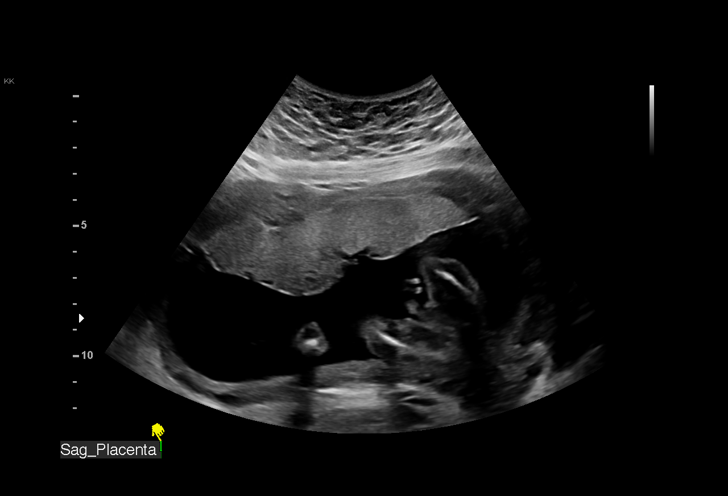
[im 98/140]
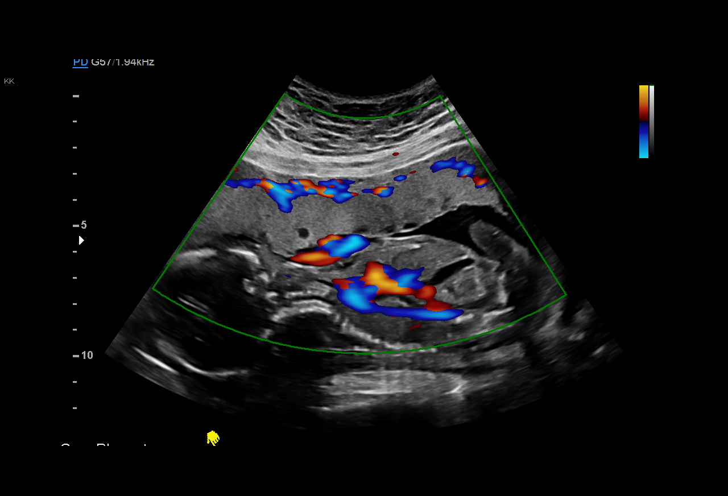
[im 109/140]
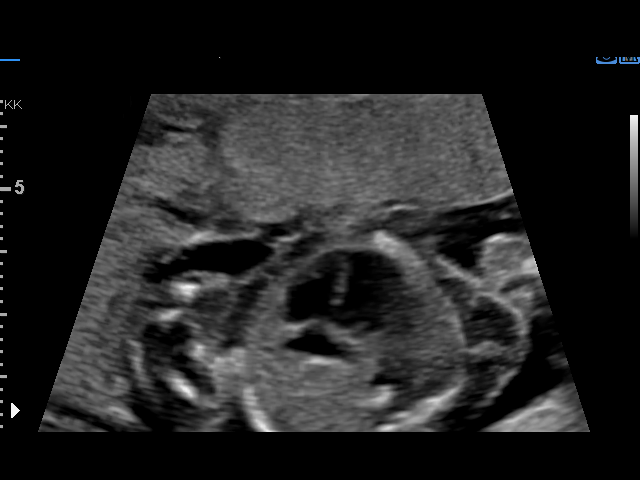
[im 119/140]
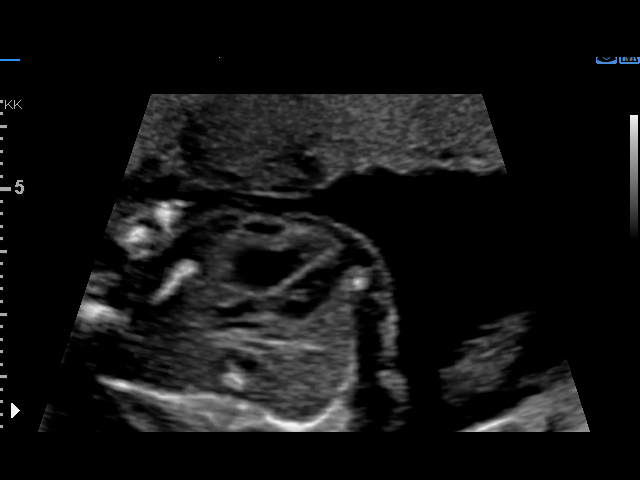
[im 129/140]
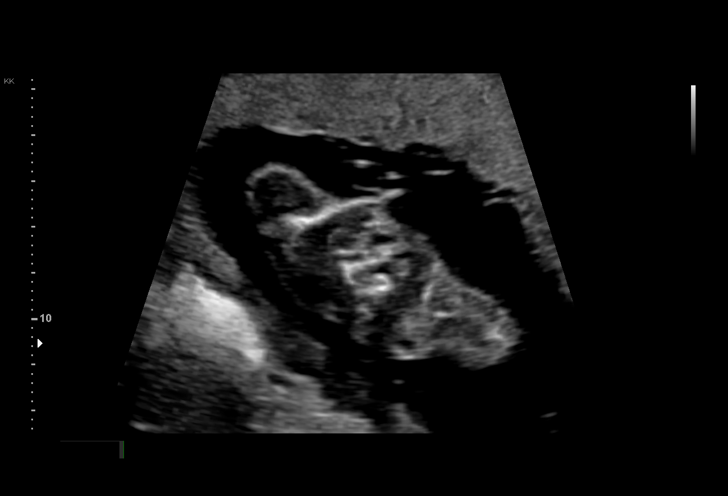
[im 140/140]
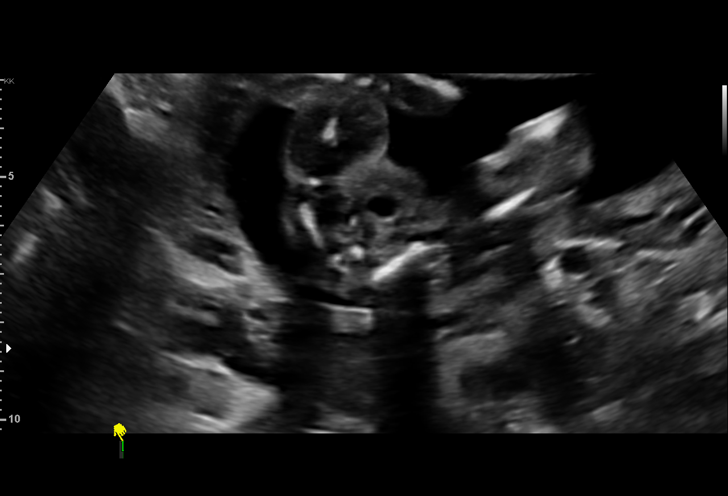

[14 of 28 positions shown; findings below may reference images not displayed]

1  KUSUM BRUNK                [id number]      [id number]     [id number]
Indications

19 weeks gestation of pregnancy
Encounter for antenatal screening for
malformations
Obesity complicating pregnancy, second
trimester
OB History

Blood Type:            Height:  4'11"  Weight (lb):  179       BMI:
Gravidity:    2         Term:   1        Prem:   0        SAB:   0
TOP:          0       Ectopic:  0        Living: 1
Fetal Evaluation

Num Of Fetuses:     1
Fetal Heart         148
Rate(bpm):
Cardiac Activity:   Observed
Presentation:       Breech
Placenta:           Anterior, above cervical os
P. Cord Insertion:  Visualized

Amniotic Fluid
AFI FV:      Subjectively within normal limits

Largest Pocket(cm)
4.4
Biometry

BPD:      47.8  mm     G. Age:  20w 3d         95  %    CI:        73.68   %    70 - 86
FL/HC:      16.9   %    16.1 -
HC:      176.9  mm     G. Age:  20w 1d         90  %    HC/AC:      1.13        1.09 -
AC:      156.1  mm     G. Age:  20w 6d         92  %    FL/BPD:     62.6   %
FL:       29.9  mm     G. Age:  19w 2d         51  %    FL/AC:      19.2   %    20 - 24
HUM:        30  mm     G. Age:  19w 6d         73  %
NFT:       3.6  mm

Est. FW:     331  gm    0 lb 12 oz      61  %
Gestational Age

LMP:           19w 0d        Date:  03/01/17                 EDD:   12/06/17
U/S Today:     20w 1d                                        EDD:   11/28/17
Best:          19w 0d     Det. By:  LMP  (03/01/17)          EDD:   12/06/17
Anatomy

Cranium:               Appears normal         Aortic Arch:            Appears normal
Cavum:                 Appears normal         Ductal Arch:            Appears normal
Ventricles:            Appears normal         Diaphragm:              Appears normal
Choroid Plexus:        Appears normal         Stomach:                Appears normal, left
sided
Cerebellum:            Appears normal         Abdomen:                Appears normal
Posterior Fossa:       Appears normal         Abdominal Wall:         Appears nml (cord
insert, abd wall)
Nuchal Fold:           Appears normal         Cord Vessels:           Appears normal (3
vessel cord)
Face:                  Appears normal         Kidneys:                Appear normal
(orbits and profile)
Lips:                  Appears normal         Bladder:                Appears normal
Thoracic:              Appears normal         Spine:                  Not well visualized
Heart:                 Appears normal         Upper Extremities:      Appears normal
(4CH, axis, and situs
RVOT:                  Appears normal         Lower Extremities:      Appears normal
LVOT:                  Appears normal

Other:  Parents do not wish to know sex of fetus. Male gender. Heels and 5th
digit visualized. Technically difficult due to fetal position.
Cervix Uterus Adnexa

Cervix
Length:            3.3  cm.
Normal appearance by transabdominal scan.
Impression

Singleton intrauterine pregnancy at 19+0 weeks, here for
anatomic survey
Review of the anatomy shows no sonographic markers for
aneuploidy or structural anomalies
However, spine evaluation should be considered suboptimal
secondary to fetal position
Amniotic fluid volume is normal
Estimated fetal weight is 331g which is growth in the 61st
percentile
Recommendations

Recommend follow-up ultrasound examination in 4 weeks to
complete the anatomic survey

## 2020-02-27 ENCOUNTER — Emergency Department: Payer: Medicaid Other

## 2020-02-27 DIAGNOSIS — R0789 Other chest pain: Secondary | ICD-10-CM | POA: Diagnosis not present

## 2020-02-27 LAB — CBC
HCT: 37.1 % (ref 36.0–46.0)
Hemoglobin: 12.5 g/dL (ref 12.0–15.0)
MCH: 29.8 pg (ref 26.0–34.0)
MCHC: 33.7 g/dL (ref 30.0–36.0)
MCV: 88.3 fL (ref 80.0–100.0)
Platelets: 314 10*3/uL (ref 150–400)
RBC: 4.2 MIL/uL (ref 3.87–5.11)
RDW: 12.6 % (ref 11.5–15.5)
WBC: 5.4 10*3/uL (ref 4.0–10.5)
nRBC: 0 % (ref 0.0–0.2)

## 2020-02-27 LAB — BASIC METABOLIC PANEL
Anion gap: 8 (ref 5–15)
BUN: 11 mg/dL (ref 6–20)
CO2: 27 mmol/L (ref 22–32)
Calcium: 9.1 mg/dL (ref 8.9–10.3)
Chloride: 102 mmol/L (ref 98–111)
Creatinine, Ser: 0.63 mg/dL (ref 0.44–1.00)
GFR calc Af Amer: >60 mL/min (ref >60)
GFR calc non Af Amer: >60 mL/min (ref >60)
Glucose, Bld: 110 mg/dL — ABNORMAL HIGH (ref 70–99)
Potassium: 3.4 mmol/L — ABNORMAL LOW (ref 3.5–5.1)
Sodium: 137 mmol/L (ref 135–145)

## 2020-02-27 LAB — TROPONIN I (HIGH SENSITIVITY): Troponin I (High Sensitivity): <2 ng/L (ref <18)

## 2020-02-27 LAB — POCT PREGNANCY, URINE: Preg Test, Ur: NEGATIVE

## 2020-02-27 NOTE — ED Triage Notes (Signed)
Pt to ED for intermittent CP and SOB x 1 month. No cough or fevers. Pt reports sometimes the pain radiates to her beck. Pt denies anything specific making pain better or worse.

## 2020-02-28 ENCOUNTER — Emergency Department
Admission: EM | Admit: 2020-02-28 | Discharge: 2020-02-28 | Disposition: A | Discharge status: Home / Self Care | Payer: Medicaid Other | Attending: Student in an Organized Health Care Education/Training Program | Admitting: Student in an Organized Health Care Education/Training Program

## 2020-02-28 DIAGNOSIS — R0789 Other chest pain: Secondary | ICD-10-CM

## 2020-02-28 LAB — TROPONIN I (HIGH SENSITIVITY): Troponin I (High Sensitivity): <2 ng/L (ref <18)

## 2020-02-28 LAB — FIBRIN DERIVATIVES D-DIMER (ARMC ONLY): Fibrin derivatives D-dimer (ARMC): 134.66 ng/mL (FEU) (ref 0.00–499.00)

## 2020-02-28 NOTE — ED Provider Notes (Signed)
[facility] Emergency Department Provider Note    None    (approximate)  I have reviewed the triage vital signs and the nursing notes.   HISTORY  Chief Complaint Chest Pain and Shortness of Breath    HPI Adrienne Griffin is a 29 y.o. female with no significant past medical history presents to the ER for 1 month of chest pain associate with some shortness of breath.  States it is intermittent.  Last several hours when it does occur.  Is not been having a cough fevers or chills.  She is currently pain-free.  She is not on any birth control.  No history of DVT.  Does not smoke.  No history of asthma or bronchitis.  Denies any history of cardiac issues.    Past Medical History:  Diagnosis Date  . Medical history non-contributory    Family History  Problem Relation Age of Onset  . Hypertension Mother   . Diabetes Mother    Past Surgical History:  Procedure Laterality Date  . WISDOM TOOTH EXTRACTION  2010   Patient Active Problem List   Diagnosis Date Noted  . Indication for care in labor or delivery 02/06/2019  . Supervision of normal pregnancy 07/28/2018  . BMI 30-39.9 07/28/2018  . UTI in pregnancy 07/28/2018  . Short interval between pregnancies affecting pregnancy, antepartum 07/28/2018  . Obesity in pregnancy 04/24/2015      Prior to Admission medications   Medication Sig Start Date End Date Taking? Authorizing Provider  ibuprofen (ADVIL) 600 MG tablet Take 1 tablet (600 mg total) by mouth every 6 (six) hours as needed for mild pain, moderate pain or cramping. 02/08/19   Cheral Marker, CNM  Prenat w/o A Vit-FeFum-FePo-FA (CONCEPT OB) 130-92.4-1 MG CAPS Take 1 tablet by mouth daily. 07/10/18   Katrinka Blazing, [place], CNM    Allergies Patient has no known allergies.    Social History Social History   Tobacco Use  . Smoking status: Never Smoker  . Smokeless tobacco: Never Used  Vaping Use  . Vaping Use: Never used  Substance Use  Topics  . Alcohol use: Not Currently    Comment: occasional pre-pregnancy  . Drug use: No    Review of Systems Patient denies headaches, rhinorrhea, blurry vision, numbness, shortness of breath, chest pain, edema, cough, abdominal pain, nausea, vomiting, diarrhea, dysuria, fevers, rashes or hallucinations unless otherwise stated above in HPI. ____________________________________________   PHYSICAL EXAM:  VITAL SIGNS: Vitals:   02/27/20 2346 02/28/20 0551  BP: 127/90 (!) 124/91  Pulse: 85 (!) 113  Resp: 20 16  Temp: 98.4 F (36.9 C) 98.3 F (36.8 C)  SpO2: 100% 97%    Constitutional: Alert and oriented.  Eyes: Conjunctivae are normal.  Head: Atraumatic. Nose: No congestion/rhinnorhea. Mouth/Throat: Mucous membranes are moist.   Neck: No stridor. Painless ROM.  Cardiovascular: Normal rate, regular rhythm. Grossly normal heart sounds.  Good peripheral circulation. Respiratory: Normal respiratory effort.  No retractions. Lungs CTAB. Gastrointestinal: Soft and nontender. No distention. No abdominal bruits. No CVA tenderness. Genitourinary:  Musculoskeletal: No lower extremity tenderness nor edema.  No joint effusions. Neurologic:  Normal speech and language. No gross focal neurologic deficits are appreciated. No facial droop Skin:  Skin is warm, dry and intact. No rash noted. Psychiatric: Mood and affect are normal. Speech and behavior are normal.  ____________________________________________   LABS (all labs ordered are listed, but only abnormal results are displayed)  Results for orders placed or performed during the hospital  encounter of 02/28/20 (from the past 24 hour(s))  Basic metabolic panel     Status: Abnormal   Collection Time: 02/27/20 10:13 PM  Result Value Ref Range   Sodium 137 135 - 145 mmol/L   Potassium 3.4 (L) 3.5 - 5.1 mmol/L   Chloride 102 98 - 111 mmol/L   CO2 27 22 - 32 mmol/L   Glucose, Bld 110 (H) 70 - 99 mg/dL   BUN 11 6 - 20 mg/dL    Creatinine, Ser 1.88 0.44 - 1.00 mg/dL   Calcium 9.1 8.9 - 41.6 mg/dL   GFR calc non Af Amer >60 >60 mL/min   GFR calc Af Amer >60 >60 mL/min   Anion gap 8 5 - 15  CBC     Status: None   Collection Time: 02/27/20 10:13 PM  Result Value Ref Range   WBC 5.4 4.0 - 10.5 K/uL   RBC 4.20 3.87 - 5.11 MIL/uL   Hemoglobin 12.5 12.0 - 15.0 g/dL   HCT 60.6 36 - 46 %   MCV 88.3 80.0 - 100.0 fL   MCH 29.8 26.0 - 34.0 pg   MCHC 33.7 30.0 - 36.0 g/dL   RDW 30.1 60.1 - 09.3 %   Platelets 314 150 - 400 K/uL   nRBC 0.0 0.0 - 0.2 %  Troponin I (High Sensitivity)     Status: None   Collection Time: 02/27/20 10:13 PM  Result Value Ref Range   Troponin I (High Sensitivity) <2 <18 ng/L  Pregnancy, urine POC     Status: None   Collection Time: 02/27/20 10:21 PM  Result Value Ref Range   Preg Test, Ur NEGATIVE NEGATIVE  Troponin I (High Sensitivity)     Status: None   Collection Time: 02/28/20  1:18 AM  Result Value Ref Range   Troponin I (High Sensitivity) <2 <18 ng/L  Fibrin derivatives D-Dimer ([facility] only)     Status: None   Collection Time: 02/28/20  7:43 AM  Result Value Ref Range   Fibrin derivatives D-dimer ([facility]) 134.66 0.00 - 499.00 ng/mL (FEU)   ____________________________________________  EKG My review and personal interpretation at Time: 22:10   Indication: chest pain  Rate: 90  Rhythm: sinus Axis: normal Other: normal intervals, no stemi ____________________________________________  RADIOLOGY  I personally reviewed all radiographic images ordered to evaluate for the above acute complaints and reviewed radiology reports and findings.  These findings were personally discussed with the patient.  Please see medical record for radiology report.  ____________________________________________   PROCEDURES  Procedure(s) performed:  Procedures    Critical Care performed: no ____________________________________________   INITIAL IMPRESSION / ASSESSMENT AND PLAN / ED  COURSE  Pertinent labs & imaging results that were available during my care of the patient were reviewed by me and considered in my medical decision making (see chart for details).   DDX: ACS, pericarditis, esophagitis, boerhaaves, pe, dissection, pna, bronchitis, costochondritis   Adrienne Griffin is a 29 y.o. who presents to the ED with symptoms as described above.  Patient clinically very well-appearing she is afebrile mildly tachycardic upon arrival but after observation in the ER is now normal heart rate.  EKG is nonischemic troponin is negative.  She is low risk by heart score.  She is low risk by Wells criteria but given her tachycardia will order D-dimer to further stratify for PE.  Clinically well-appearing.  I do not appreciate any wheezing.     D-dimer is negative.  Patient's work-up is reassuring.  She is appropriate for outpatient follow-up.  The patient was evaluated in Emergency Department today for the symptoms described in the history of present illness. He/she was evaluated in the context of the global COVID-19 pandemic, which necessitated consideration that the patient might be at risk for infection with the SARS-CoV-2 virus that causes COVID-19. Institutional protocols and algorithms that pertain to the evaluation of patients at risk for COVID-19 are in a state of rapid change based on information released by regulatory bodies including the CDC and federal and state organizations. These policies and algorithms were followed during the patient's care in the ED.  As part of my medical decision making, I reviewed the following data within the electronic MEDICAL RECORD NUMBER Nursing notes reviewed and incorporated, Labs reviewed, notes from prior ED visits and [place] Controlled Substance Database   ____________________________________________   FINAL CLINICAL IMPRESSION(S) / ED DIAGNOSES  Final diagnoses:  Atypical chest pain      NEW MEDICATIONS STARTED DURING THIS  VISIT:  New Prescriptions   No medications on file     Note:  This document was prepared using Dragon voice recognition software and may include unintentional dictation errors.    Willy Eddy, MD 02/28/20 [phone]

## 2020-07-27 NOTE — L&D Delivery Note (Addendum)
OB/GYN Faculty Practice Delivery Note  Adrienne Griffin is a 30 y.o. [phone] s/p SVD at [redacted]w[redacted]d. She was admitted for SOL.   ROM: 0h 31m with clear fluid GBS Status: neg  Delivery Date/Time: 06/23/2021 at 2325 Delivery: Called to room and patient was complete and pushing. Head delivered LOA. Nuchal cord present and delivered through. Shoulder and body delivered in usual fashion and cord was reduced. Infant with spontaneous cry, placed on mother's abdomen, dried and stimulated. Cord clamped x 2 after 1-minute delay, and cut by FOB under my direct supervision. Cord blood drawn. Placenta delivered spontaneously with gentle cord traction. Fundus firm with massage and Pitocin. Labia, perineum, vagina, and cervix were inspected and the patient was found to have no tears.   Placenta: intact, 3VC - sent to L&D Complications: none Lacerations: none EBL: 50 cc Analgesia: Epidural   Infant: viable female  APGARs 8, 9   Yves Dill, MD PGY1   The above was performed under my direct supervision and guidance.

## 2020-12-05 ENCOUNTER — Other Ambulatory Visit (HOSPITAL_COMMUNITY)
Admission: RE | Admit: 2020-12-05 | Discharge: 2020-12-05 | Disposition: A | Discharge status: Home / Self Care | Payer: Medicaid Other | Source: Ambulatory Visit | Attending: Family Medicine | Admitting: Family Medicine

## 2020-12-05 ENCOUNTER — Ambulatory Visit (INDEPENDENT_AMBULATORY_CARE_PROVIDER_SITE_OTHER): Payer: Medicaid Other | Admitting: Family Medicine

## 2020-12-05 ENCOUNTER — Encounter: Payer: Self-pay | Admitting: Family Medicine

## 2020-12-05 ENCOUNTER — Other Ambulatory Visit: Payer: Self-pay

## 2020-12-05 ENCOUNTER — Ambulatory Visit: Payer: Self-pay

## 2020-12-05 VITALS — BP 126/85 | HR 91 | Wt 189.0 lb

## 2020-12-05 DIAGNOSIS — Z3480 Encounter for supervision of other normal pregnancy, unspecified trimester: Secondary | ICD-10-CM

## 2020-12-05 DIAGNOSIS — O09899 Supervision of other high risk pregnancies, unspecified trimester: Secondary | ICD-10-CM

## 2020-12-05 DIAGNOSIS — Z3A10 10 weeks gestation of pregnancy: Secondary | ICD-10-CM

## 2020-12-05 DIAGNOSIS — O09891 Supervision of other high risk pregnancies, first trimester: Secondary | ICD-10-CM | POA: Diagnosis not present

## 2020-12-05 LAB — HEPATITIS C ANTIBODY: HCV Ab: NEGATIVE

## 2020-12-05 NOTE — Progress Notes (Signed)
Subjective:   Adrienne Griffin is a 30 y.o. [phone] at [redacted]w[redacted]d by LMP, early ultrasound being seen today for her first obstetrical visit.  Her obstetrical history is significant for obesity and short interval between pregnancies. Patient does intend to breast feed. Pregnancy history fully reviewed.  Patient reports nausea.  HISTORY: OB History  Gravida Para Term Preterm AB Living  4 3 3  0 0 3  SAB IAB Ectopic Multiple Live Births  0 0 0 0 3    # Outcome Date GA Lbr Len/2nd Weight Sex Delivery Anes PTL Lv  4 Current           3 Term 02/06/19 [redacted]w[redacted]d 01:22 / 00:12 7 lb 11.1 oz (3.49 kg) M Vag-Spont EPI  LIV     Name: Marini,BOY Jemiah     Apgar1: 9  Apgar5: 9  2 Term 12/03/17 [redacted]w[redacted]d 16:22 / 01:39 7 lb 11.8 oz (3.51 kg) M Vag-Spont EPI  LIV     Name: Grace,BOY Marieli     Apgar1: 8  Apgar5: 9  1 Term 12/15/15 [redacted]w[redacted]d 18:45 / 06:49 7 lb 9.5 oz (3.445 kg) M Vag-Vacuum EPI  LIV     Name: ABDOULAYE,AMIR     Apgar1: 9  Apgar5: 9   Last pap smear was  07/28/2018 and was normal Past Medical History:  Diagnosis Date  . Medical history non-contributory    Past Surgical History:  Procedure Laterality Date  . WISDOM TOOTH EXTRACTION  2010   Family History  Problem Relation Age of Onset  . Hypertension Mother   . Diabetes Mother    Social History   Tobacco Use  . Smoking status: Never Smoker  . Smokeless tobacco: Never Used  Vaping Use  . Vaping Use: Never used  Substance Use Topics  . Alcohol use: Not Currently    Comment: occasional pre-pregnancy  . Drug use: No   No Known Allergies Current Outpatient Medications on File Prior to Visit  Medication Sig Dispense Refill  . Prenat w/o A Vit-FeFum-FePo-FA (CONCEPT OB) 130-92.4-1 MG CAPS Take 1 tablet by mouth daily. 30 capsule 12   No current facility-administered medications on file prior to visit.     Exam   Vitals:   12/05/20 1348  BP: 126/85  Pulse: 91  Weight: 189 lb (85.7 kg)   Fetal Heart Rate (bpm):  183  Uterus:     Pelvic Exam: Perineum: no hemorrhoids, normal perineum   Vulva: normal external genitalia, no lesions   Vagina:  normal mucosa, normal discharge   Cervix: no lesions and normal, pap smear done.    Adnexa: normal adnexa and no mass, fullness, tenderness   Bony Pelvis: average  System: General: well-developed, well-nourished female in no acute distress   Breast:  normal appearance, no masses or tenderness   Skin: normal coloration and turgor, no rashes   Neurologic: oriented, normal, negative, normal mood   Extremities: normal strength, tone, and muscle mass, ROM of all joints is normal   HEENT PERRLA, extraocular movement intact and sclera clear, anicteric   Mouth/Teeth mucous membranes moist, pharynx normal without lesions and dental hygiene good   Neck supple and no masses   Cardiovascular: regular rate and rhythm   Respiratory:  no respiratory distress, normal breath sounds   Abdomen: soft, non-tender; bowel sounds normal; no masses,  no organomegaly   Limited OB u/s reveals IUP c/w dates by CRL, + flicker   Assessment:   Pregnancy: 02/04/21 Patient Active Problem List  Diagnosis Date Noted  . Supervision of normal pregnancy 07/28/2018  . BMI 30-39.9 07/28/2018  . UTI in pregnancy 07/28/2018  . Short interval between pregnancies affecting pregnancy, antepartum 07/28/2018  . Obesity in pregnancy 04/24/2015     Plan:  1. Supervision of other normal pregnancy, antepartum New OB labs Take PNVs at bedtime to avoid nausea. - US OB Limited; Future - Culture, OB Urine - GC/Chlamydia probe amp ([address])not at [facility] - Hemoglobin A1c - TSH - [place] MFM OB COMP + 14 WK; Future - Hepatitis C antibody - Obstetric Panel, Including HIV  2. Short interval between pregnancies affecting pregnancy, antepartum    Initial labs drawn. Continue prenatal vitamins. Genetic Screening discussed, NIPS: declined. Ultrasound discussed; fetal anatomic survey:  ordered. Problem list reviewed and updated. The nature of [facility] with multiple MDs and other Advanced Practice Providers was explained to patient; also emphasized that residents, students are part of our team. Routine obstetric precautions reviewed. Return in 4 weeks (on 01/02/2021).

## 2020-12-05 NOTE — Patient Instructions (Signed)

## 2020-12-05 NOTE — Progress Notes (Signed)
DATING AND VIABILITY SONOGRAM   Adrienne Griffin is a 30 y.o. year old G57P3003 with LMP Patient's last menstrual period was 09/22/2020 (approximate). which would correlate to  [redacted]w[redacted]d weeks gestation.  She has regular menstrual cycles.   She is here today for a confirmatory initial sonogram.    GESTATION: SINGLETON yes     FETAL ACTIVITY:          Heart rate         183          The fetus is active.     GESTATIONAL AGE AND  BIOMETRICS:  Gestational criteria: Estimated Date of Delivery: 06/29/21 by LMP now at [redacted]w[redacted]d  Previous Scans:0  GESTATIONAL SAC            mm          weeks  CROWN RUMP LENGTH           3.94 cm         10.4 weeks                                                   AVERAGE EGA(BY THIS SCAN):  10.4 weeks  WORKING EDD( LMP ):  06/29/2021     TECHNICIAN COMMENTS:  Patient informed that the ultrasound is considered a limited obstetric ultrasound and is not intended to be a complete ultrasound exam.  Patient also informed that the ultrasound is not being completed with the intent of assessing for fetal or placental anomalies or any pelvic abnormalities. Explained that the purpose of today's ultrasound is to assess for fetal heart rate.  Patient acknowledges the purpose of the exam and the limitations of the study.           A copy of this report including all images has been saved and backed up to a second source for retrieval if needed. All measures and details of the anatomical scan, placentation, fluid volume and pelvic anatomy are contained in that report.  Scheryl Marten 12/05/2020 2:14 PM

## 2020-12-06 LAB — GC/CHLAMYDIA PROBE AMP (~~LOC~~) NOT AT ARMC
Chlamydia: NEGATIVE
Comment: NEGATIVE
Comment: NORMAL
Neisseria Gonorrhea: NEGATIVE

## 2020-12-07 LAB — CULTURE, OB URINE

## 2020-12-07 LAB — URINE CULTURE, OB REFLEX: Organism ID, Bacteria: NO GROWTH

## 2020-12-10 LAB — OBSTETRIC PANEL, INCLUDING HIV
Antibody Screen: NEGATIVE
Basophils Absolute: 0 10*3/uL (ref 0.0–0.2)
Basos: 1 %
EOS (ABSOLUTE): 0 10*3/uL (ref 0.0–0.4)
Eos: 1 %
HIV Screen 4th Generation wRfx: NONREACTIVE
Hematocrit: 35.3 % (ref 34.0–46.6)
Hemoglobin: 11.9 g/dL (ref 11.1–15.9)
Hepatitis B Surface Ag: NEGATIVE
Immature Grans (Abs): 0 10*3/uL (ref 0.0–0.1)
Immature Granulocytes: 1 %
Lymphocytes Absolute: 2 10*3/uL (ref 0.7–3.1)
Lymphs: 37 %
MCH: 30.2 pg (ref 26.6–33.0)
MCHC: 33.7 g/dL (ref 31.5–35.7)
MCV: 90 fL (ref 79–97)
Monocytes Absolute: 0.3 10*3/uL (ref 0.1–0.9)
Monocytes: 6 %
Neutrophils Absolute: 3 10*3/uL (ref 1.4–7.0)
Neutrophils: 54 %
Platelets: 269 10*3/uL (ref 150–450)
RBC: 3.94 x10E6/uL (ref 3.77–5.28)
RDW: 11.8 % (ref 11.7–15.4)
RPR Ser Ql: NONREACTIVE
Rh Factor: POSITIVE
Rubella Antibodies, IGG: 1.35 index (ref >0.99)
WBC: 5.4 10*3/uL (ref 3.4–10.8)

## 2020-12-10 LAB — TSH: TSH: 1.06 u[IU]/mL (ref 0.450–4.500)

## 2020-12-10 LAB — HEMOGLOBIN A1C
Est. average glucose Bld gHb Est-mCnc: 108 mg/dL
Hgb A1c MFr Bld: 5.4 % (ref 4.8–5.6)

## 2020-12-10 LAB — HEPATITIS C ANTIBODY: Hep C Virus Ab: <0.1 s/co ratio (ref 0.0–0.9)

## 2021-01-06 ENCOUNTER — Telehealth (INDEPENDENT_AMBULATORY_CARE_PROVIDER_SITE_OTHER): Payer: Medicaid Other | Admitting: Obstetrics & Gynecology

## 2021-01-06 DIAGNOSIS — Z3482 Encounter for supervision of other normal pregnancy, second trimester: Secondary | ICD-10-CM

## 2021-01-06 DIAGNOSIS — Z3A15 15 weeks gestation of pregnancy: Secondary | ICD-10-CM

## 2021-01-06 NOTE — Progress Notes (Signed)
I connected with  Ivone Licht Ammirati on 01/06/21 at  2:45 PM EDT by telephone and verified that I am speaking with the correct person using two identifiers.   I discussed the limitations, risks, security and privacy concerns of performing an evaluation and management service by telephone and the availability of in person appointments. I also discussed with the patient that there may be a patient responsible charge related to this service. The patient expressed understanding and agreed to proceed.  Scheryl Marten, RN 01/06/2021  3:00 PM

## 2021-01-06 NOTE — Patient Instructions (Signed)

## 2021-01-06 NOTE — Progress Notes (Signed)
   OBSTETRICS PRENATAL VIRTUAL VISIT ENCOUNTER NOTE  Provider location: Center for [facility]   Patient location: Home  I connected with Adrienne Griffin on 01/06/21 at  2:45 PM EDT by MyChart Video Encounter and verified that I am speaking with the correct person using two identifiers. I discussed the limitations, risks, security and privacy concerns of performing an evaluation and management service virtually and the availability of in person appointments. I also discussed with the patient that there may be a patient responsible charge related to this service. The patient expressed understanding and agreed to proceed. Subjective:  Adrienne Griffin is a 30 y.o. [phone] at [redacted]w[redacted]d being seen today for ongoing prenatal care.  She is currently monitored for the following issues for this low-risk pregnancy and has Obesity in pregnancy; Supervision of normal pregnancy; BMI 30-39.9; UTI in pregnancy; and Short interval between pregnancies affecting pregnancy, antepartum on their problem list.  Patient reports no complaints.  Contractions: Not present. Vag. Bleeding: None.   . Denies any leaking of fluid.   The following portions of the patient's history were reviewed and updated as appropriate: allergies, current medications, past family history, past medical history, past social history, past surgical history and problem list.   Objective:  There were no vitals filed for this visit. Normal BP on Babyscripts.  Fetal Status:           General:  Alert, oriented and cooperative. Patient is in no acute distress.  Respiratory: Normal respiratory effort, no problems with respiration noted  Mental Status: Normal mood and affect. Normal behavior. Normal judgment and thought content.  Rest of physical exam deferred due to type of encounter  Imaging: No results found.  Assessment and Plan:  Pregnancy: G4P3003 at [redacted]w[redacted]d 1. [redacted] weeks gestation of pregnancy 2. Encounter for  supervision of other normal pregnancy in second trimester Already scheduled for anatomy scan. No other concerns. No other complaints or concerns.  Routine obstetric precautions reviewed.  I discussed the assessment and treatment plan with the patient. The patient was provided an opportunity to ask questions and all were answered. The patient agreed with the plan and demonstrated an understanding of the instructions. The patient was advised to call back or seek an in-person office evaluation/go to [facility] at [facility] for any urgent or concerning symptoms. Please refer to After Visit Summary for other counseling recommendations.   I provided 7 minutes of face-to-face time during this encounter.  Return in about 4 weeks (around 02/03/2021) for Virtual OB Visit.  Future Appointments  Date Time Provider Department Center  02/03/2021  1:45 PM [place], MD [facility]  02/10/2021  1:00 PM [facility] US1 [facility]    Jaynie Collins, MD Center for [facility], [facility]

## 2021-01-10 ENCOUNTER — Other Ambulatory Visit: Payer: Self-pay

## 2021-01-10 ENCOUNTER — Other Ambulatory Visit: Payer: Medicaid Other

## 2021-01-10 DIAGNOSIS — Z3482 Encounter for supervision of other normal pregnancy, second trimester: Secondary | ICD-10-CM

## 2021-01-20 ENCOUNTER — Encounter: Payer: Self-pay | Admitting: Radiology

## 2021-01-20 ENCOUNTER — Telehealth: Payer: Self-pay | Admitting: Radiology

## 2021-01-20 NOTE — Telephone Encounter (Signed)
Patient was informed of Panorama screening results including fetal sex

## 2021-02-03 ENCOUNTER — Telehealth (INDEPENDENT_AMBULATORY_CARE_PROVIDER_SITE_OTHER): Payer: Medicaid Other | Admitting: Obstetrics and Gynecology

## 2021-02-03 ENCOUNTER — Other Ambulatory Visit: Payer: Self-pay

## 2021-02-03 VITALS — BP 101/69

## 2021-02-03 DIAGNOSIS — Z3A19 19 weeks gestation of pregnancy: Secondary | ICD-10-CM

## 2021-02-03 DIAGNOSIS — Z3492 Encounter for supervision of normal pregnancy, unspecified, second trimester: Secondary | ICD-10-CM

## 2021-02-03 NOTE — Progress Notes (Signed)
     TELEHEALTH OBSTETRICS VISIT ENCOUNTER NOTE  Provider location: Center for [facility] at [facility]   Patient location: Home  I connected with Adrienne Griffin on 02/03/21 at  1:45 PM EDT by telephone at home and verified that I am speaking with the correct person using two identifiers. Of note, unable to do video encounter due to technical difficulties.    I discussed the limitations, risks, security and privacy concerns of performing an evaluation and management service by telephone and the availability of in person appointments. I also discussed with the patient that there may be a patient responsible charge related to this service. The patient expressed understanding and agreed to proceed.  Subjective:  Adrienne Griffin is a 30 y.o. [phone] at [redacted]w[redacted]d being followed for ongoing prenatal care.  She is currently monitored for the following issues for this low-risk pregnancy and has Obesity in pregnancy; Supervision of normal pregnancy; BMI 30-39.9; UTI in pregnancy; and Short interval between pregnancies affecting pregnancy, antepartum on their problem list.  Patient reports no complaints. Reports fetal movement. Denies any contractions, bleeding or leaking of fluid.   The following portions of the patient's history were reviewed and updated as appropriate: allergies, current medications, past family history, past medical history, past social history, past surgical history and problem list.   Objective:  Blood pressure 101/69, last menstrual period 09/22/2020, unknown if currently breastfeeding. General:  Alert, oriented and cooperative.   Mental Status: Normal mood and affect perceived. Normal judgment and thought content.  Rest of physical exam deferred due to type of encounter  Assessment and Plan:  Pregnancy: G4P3003 at [redacted]w[redacted]d 1. [redacted] weeks gestation of pregnancy Routine care. Has anatomy u/s in one week.   Preterm labor symptoms and general obstetric precautions  including but not limited to vaginal bleeding, contractions, leaking of fluid and fetal movement were reviewed in detail with the patient.  I discussed the assessment and treatment plan with the patient. The patient was provided an opportunity to ask questions and all were answered. The patient agreed with the plan and demonstrated an understanding of the instructions. The patient was advised to call back or seek an in-person office evaluation/go to MAU at [facility] for any urgent or concerning symptoms. Please refer to After Visit Summary for other counseling recommendations.   I provided 7 minutes of non-face-to-face time during this encounter.  No follow-ups on file.  Future Appointments  Date Time Provider Department Center  02/10/2021  1:00 PM [facility] US1 [facility]  03/03/2021  2:30 PM Reva Bores, MD [facility]    [place] Bing, MD Center for [facility], [facility]

## 2021-02-10 ENCOUNTER — Other Ambulatory Visit: Payer: Self-pay

## 2021-02-10 ENCOUNTER — Telehealth: Payer: Self-pay

## 2021-02-10 ENCOUNTER — Ambulatory Visit: Payer: Medicaid Other | Attending: Family Medicine

## 2021-02-10 DIAGNOSIS — Z3480 Encounter for supervision of other normal pregnancy, unspecified trimester: Secondary | ICD-10-CM | POA: Insufficient documentation

## 2021-02-10 DIAGNOSIS — E669 Obesity, unspecified: Secondary | ICD-10-CM

## 2021-02-10 DIAGNOSIS — Z3A20 20 weeks gestation of pregnancy: Secondary | ICD-10-CM

## 2021-02-10 DIAGNOSIS — Z363 Encounter for antenatal screening for malformations: Secondary | ICD-10-CM | POA: Diagnosis not present

## 2021-02-10 DIAGNOSIS — O99212 Obesity complicating pregnancy, second trimester: Secondary | ICD-10-CM | POA: Diagnosis not present

## 2021-02-10 NOTE — Telephone Encounter (Signed)
New message   Patient has concern about her anatomy scan that was done today, patient voiced she felt rush.   Asking for a call back to discuss.

## 2021-03-03 ENCOUNTER — Ambulatory Visit (INDEPENDENT_AMBULATORY_CARE_PROVIDER_SITE_OTHER): Payer: Medicaid Other | Admitting: Family Medicine

## 2021-03-03 ENCOUNTER — Other Ambulatory Visit: Payer: Self-pay

## 2021-03-03 ENCOUNTER — Encounter: Payer: Self-pay | Admitting: Family Medicine

## 2021-03-03 VITALS — BP 112/77 | HR 87 | Wt 194.0 lb

## 2021-03-03 DIAGNOSIS — Z3482 Encounter for supervision of other normal pregnancy, second trimester: Secondary | ICD-10-CM

## 2021-03-03 DIAGNOSIS — O09899 Supervision of other high risk pregnancies, unspecified trimester: Secondary | ICD-10-CM

## 2021-03-03 DIAGNOSIS — Z3A23 23 weeks gestation of pregnancy: Secondary | ICD-10-CM

## 2021-03-03 NOTE — Patient Instructions (Signed)

## 2021-03-03 NOTE — Progress Notes (Signed)
   PRENATAL VISIT NOTE  Subjective:  Adrienne Griffin is a 30 y.o. [phone] at [redacted]w[redacted]d being seen today for ongoing prenatal care.  She is currently monitored for the following issues for this low-risk pregnancy and has Obesity in pregnancy; Supervision of normal pregnancy; BMI 30-39.9; UTI in pregnancy; and Short interval between pregnancies affecting pregnancy, antepartum on their problem list.  Patient reports no complaints.  Contractions: Not present. Vag. Bleeding: None.  Movement: Present. Denies leaking of fluid.   The following portions of the patient's history were reviewed and updated as appropriate: allergies, current medications, past family history, past medical history, past social history, past surgical history and problem list.   Objective:   Vitals:   03/03/21 1455  BP: 112/77  Pulse: 87  Weight: 194 lb (88 kg)    Fetal Status: Fetal Heart Rate (bpm): 140   Movement: Present     General:  Alert, oriented and cooperative. Patient is in no acute distress.  Skin: Skin is warm and dry. No rash noted.   Cardiovascular: Normal heart rate noted  Respiratory: Normal respiratory effort, no problems with respiration noted  Abdomen: Soft, gravid, appropriate for gestational age.  Pain/Pressure: Absent     Pelvic: Cervical exam deferred        Extremities: Normal range of motion.  Edema: None  Mental Status: Normal mood and affect. Normal behavior. Normal judgment and thought content.   Assessment and Plan:  Pregnancy: G4P3003 at [redacted]w[redacted]d 1. Encounter for supervision of other normal pregnancy in second trimester Anatomy u/s is incomplete. Needs f/u scheduled 28 wk labs next visit. - [place] MFM OB FOLLOW UP; Future  2. Short interval between pregnancies affecting pregnancy, antepartum   Preterm labor symptoms and general obstetric precautions including but not limited to vaginal bleeding, contractions, leaking of fluid and fetal movement were reviewed in detail with the  patient. Please refer to After Visit Summary for other counseling recommendations.   Return in 4 weeks (on 03/31/2021) for 28 wk labs.  No future appointments.  Reva Bores, MD

## 2021-03-24 ENCOUNTER — Other Ambulatory Visit: Payer: Self-pay | Admitting: *Deleted

## 2021-03-24 ENCOUNTER — Encounter: Payer: Self-pay | Admitting: *Deleted

## 2021-03-24 ENCOUNTER — Other Ambulatory Visit: Payer: Self-pay

## 2021-03-24 ENCOUNTER — Ambulatory Visit: Payer: Medicaid Other | Attending: Family Medicine

## 2021-03-24 ENCOUNTER — Ambulatory Visit: Payer: Medicaid Other | Admitting: *Deleted

## 2021-03-24 VITALS — BP 114/73 | HR 89

## 2021-03-24 DIAGNOSIS — Z3482 Encounter for supervision of other normal pregnancy, second trimester: Secondary | ICD-10-CM | POA: Diagnosis not present

## 2021-03-24 DIAGNOSIS — Z362 Encounter for other antenatal screening follow-up: Secondary | ICD-10-CM | POA: Insufficient documentation

## 2021-04-01 ENCOUNTER — Other Ambulatory Visit: Payer: Self-pay

## 2021-04-01 ENCOUNTER — Telehealth (INDEPENDENT_AMBULATORY_CARE_PROVIDER_SITE_OTHER): Payer: Medicaid Other | Admitting: Obstetrics and Gynecology

## 2021-04-01 ENCOUNTER — Telehealth: Payer: Self-pay | Admitting: Radiology

## 2021-04-01 VITALS — BP 100/65

## 2021-04-01 DIAGNOSIS — Z3A27 27 weeks gestation of pregnancy: Secondary | ICD-10-CM | POA: Insufficient documentation

## 2021-04-01 DIAGNOSIS — O3662X0 Maternal care for excessive fetal growth, second trimester, not applicable or unspecified: Secondary | ICD-10-CM | POA: Insufficient documentation

## 2021-04-01 NOTE — Telephone Encounter (Signed)
Left message for patient to call to schedule GTT appointment in 1 week and 30 Wk ROB in 3 weeks.

## 2021-04-01 NOTE — Progress Notes (Signed)
I connected with  Coryn Mosso Gair on 04/01/21 at  8:55 AM EDT by telephone and verified that I am speaking with the correct person using two identifiers.   I discussed the limitations, risks, security and privacy concerns of performing an evaluation and management service by telephone and the availability of in person appointments. I also discussed with the patient that there may be a patient responsible charge related to this service. The patient expressed understanding and agreed to proceed.  Scheryl Marten, RN 04/01/2021  8:49 AM

## 2021-04-01 NOTE — Progress Notes (Signed)
    TELEHEALTH OBSTETRICS VISIT ENCOUNTER NOTE  Provider location: Center for [facility] at [facility]   Patient location: Home  I connected with Adrienne Griffin on 04/01/21 at  8:55 AM EDT by telephone at home and verified that I am speaking with the correct person using two identifiers. Of note, unable to do video encounter due to technical difficulties.    I discussed the limitations, risks, security and privacy concerns of performing an evaluation and management service by telephone and the availability of in person appointments. I also discussed with the patient that there may be a patient responsible charge related to this service. The patient expressed understanding and agreed to proceed.  Subjective:  Adrienne Griffin is a 30 y.o. [phone] at [redacted]w[redacted]d being followed for ongoing prenatal care.  She is currently monitored for the following issues for this low-risk pregnancy and has Obesity in pregnancy; Supervision of normal pregnancy; BMI 30-39.9; UTI in pregnancy; Short interval between pregnancies affecting pregnancy, antepartum; Excessive fetal growth affecting management of mother in second trimester, antepartum; and [redacted] weeks gestation of pregnancy on their problem list.  Patient reports no complaints. Reports fetal movement. Denies any contractions, bleeding or leaking of fluid.   The following portions of the patient's history were reviewed and updated as appropriate: allergies, current medications, past family history, past medical history, past social history, past surgical history and problem list.   Objective:  Blood pressure 100/65, last menstrual period 09/22/2020, unknown if currently breastfeeding. General:  Alert, oriented and cooperative.   Mental Status: Normal mood and affect perceived. Normal judgment and thought content.  Rest of physical exam deferred due to type of encounter  Assessment and Plan:  Pregnancy: G4P3003 at [redacted]w[redacted]d 1. Excessive fetal  growth affecting management of pregnancy in second trimester, single or unspecified fetus F/u at subsequent u/s. Has 2h GTT nv  2. [redacted] weeks gestation of pregnancy Has completion u/s in a month  Preterm labor symptoms and general obstetric precautions including but not limited to vaginal bleeding, contractions, leaking of fluid and fetal movement were reviewed in detail with the patient.  I discussed the assessment and treatment plan with the patient. The patient was provided an opportunity to ask questions and all were answered. The patient agreed with the plan and demonstrated an understanding of the instructions. The patient was advised to call back or seek an in-person office evaluation/go to MAU at [facility] for any urgent or concerning symptoms. Please refer to After Visit Summary for other counseling recommendations.   I provided 7 minutes of non-face-to-face time during this encounter.  Return in about 1 week (around 04/08/2021) for fasting 2hr GTT.  Future Appointments  Date Time Provider Department Center  04/21/2021  2:45 PM [facility]URSE [facility]  04/21/2021  3:00 PM [facility] US1 [facility]    [place] Bing, MD Center for [employer], [facility]

## 2021-04-03 ENCOUNTER — Other Ambulatory Visit: Payer: Self-pay | Admitting: *Deleted

## 2021-04-03 DIAGNOSIS — Z3482 Encounter for supervision of other normal pregnancy, second trimester: Secondary | ICD-10-CM

## 2021-04-07 ENCOUNTER — Other Ambulatory Visit: Payer: Medicaid Other

## 2021-04-07 DIAGNOSIS — Z3482 Encounter for supervision of other normal pregnancy, second trimester: Secondary | ICD-10-CM

## 2021-04-07 LAB — CBC
Hematocrit: 31.5 % — ABNORMAL LOW (ref 34.0–46.6)
Hemoglobin: 10.4 g/dL — ABNORMAL LOW (ref 11.1–15.9)
MCH: 28.4 pg (ref 26.6–33.0)
MCHC: 33 g/dL (ref 31.5–35.7)
MCV: 86 fL (ref 79–97)
Platelets: 238 10*3/uL (ref 150–450)
RBC: 3.66 x10E6/uL — ABNORMAL LOW (ref 3.77–5.28)
RDW: 13.1 % (ref 11.7–15.4)
WBC: 8.1 10*3/uL (ref 3.4–10.8)

## 2021-04-08 LAB — GLUCOSE TOLERANCE, 2 HOURS W/ 1HR
Glucose, 1 hour: 167 mg/dL (ref 65–179)
Glucose, 2 hour: 128 mg/dL (ref 65–152)
Glucose, Fasting: 79 mg/dL (ref 65–91)

## 2021-04-08 LAB — HIV ANTIBODY (ROUTINE TESTING W REFLEX): HIV Screen 4th Generation wRfx: NONREACTIVE

## 2021-04-08 LAB — RPR: RPR Ser Ql: NONREACTIVE

## 2021-04-21 ENCOUNTER — Ambulatory Visit: Payer: Medicaid Other | Admitting: *Deleted

## 2021-04-21 ENCOUNTER — Telehealth (INDEPENDENT_AMBULATORY_CARE_PROVIDER_SITE_OTHER): Payer: Medicaid Other | Admitting: Obstetrics and Gynecology

## 2021-04-21 ENCOUNTER — Encounter: Payer: Self-pay | Admitting: *Deleted

## 2021-04-21 ENCOUNTER — Ambulatory Visit: Payer: Medicaid Other | Attending: Obstetrics and Gynecology

## 2021-04-21 ENCOUNTER — Other Ambulatory Visit: Payer: Self-pay

## 2021-04-21 VITALS — BP 101/62

## 2021-04-21 DIAGNOSIS — Z3A30 30 weeks gestation of pregnancy: Secondary | ICD-10-CM

## 2021-04-21 DIAGNOSIS — Z3482 Encounter for supervision of other normal pregnancy, second trimester: Secondary | ICD-10-CM

## 2021-04-21 DIAGNOSIS — E669 Obesity, unspecified: Secondary | ICD-10-CM | POA: Diagnosis not present

## 2021-04-21 DIAGNOSIS — O3662X0 Maternal care for excessive fetal growth, second trimester, not applicable or unspecified: Secondary | ICD-10-CM

## 2021-04-21 DIAGNOSIS — O99213 Obesity complicating pregnancy, third trimester: Secondary | ICD-10-CM

## 2021-04-21 DIAGNOSIS — Z362 Encounter for other antenatal screening follow-up: Secondary | ICD-10-CM | POA: Diagnosis not present

## 2021-04-21 DIAGNOSIS — O3663X0 Maternal care for excessive fetal growth, third trimester, not applicable or unspecified: Secondary | ICD-10-CM

## 2021-04-21 DIAGNOSIS — O99210 Obesity complicating pregnancy, unspecified trimester: Secondary | ICD-10-CM

## 2021-04-21 DIAGNOSIS — O09893 Supervision of other high risk pregnancies, third trimester: Secondary | ICD-10-CM | POA: Diagnosis not present

## 2021-04-21 NOTE — Progress Notes (Signed)
    TELEHEALTH OBSTETRICS VISIT ENCOUNTER NOTE  Provider location: Center for [facility] at [facility]   Patient location: Home  I connected with Adrienne Griffin on 04/21/21 at  1:40 PM EDT by telephone at home and verified that I am speaking with the correct person using two identifiers. Of note, unable to do video encounter due to technical difficulties.    I discussed the limitations, risks, security and privacy concerns of performing an evaluation and management service by telephone and the availability of in person appointments. I also discussed with the patient that there may be a patient responsible charge related to this service. The patient expressed understanding and agreed to proceed.  Subjective:  Adrienne Griffin is a 30 y.o. [phone] at [redacted]w[redacted]d being followed for ongoing prenatal care.  She is currently monitored for the following issues for this low-risk pregnancy and has Obesity in pregnancy; Supervision of normal pregnancy; BMI 30-39.9; UTI in pregnancy; Short interval between pregnancies affecting pregnancy, antepartum; and Excessive fetal growth affecting management of mother in second trimester, antepartum on their problem list.  Patient reports no complaints. Reports fetal movement. Denies any contractions, bleeding or leaking of fluid.   The following portions of the patient's history were reviewed and updated as appropriate: allergies, current medications, past family history, past medical history, past social history, past surgical history and problem list.   Objective:  Blood pressure 101/62, last menstrual period 09/22/2020, unknown if currently breastfeeding. General:  Alert, oriented and cooperative.   Mental Status: Normal mood and affect perceived. Normal judgment and thought content.  Rest of physical exam deferred due to type of encounter  Assessment and Plan:  Pregnancy: G4P3003 at [redacted]w[redacted]d 1. [redacted] weeks gestation of pregnancy  2. Obesity in  pregnancy  3. Encounter for supervision of other normal pregnancy in second trimester  4. Excessive fetal growth affecting management of pregnancy in second trimester, single or unspecified fetus Has rpt growth today. 28wk, 2h GTT normal last visit  Preterm labor symptoms and general obstetric precautions including but not limited to vaginal bleeding, contractions, leaking of fluid and fetal movement were reviewed in detail with the patient.  I discussed the assessment and treatment plan with the patient. The patient was provided an opportunity to ask questions and all were answered. The patient agreed with the plan and demonstrated an understanding of the instructions. The patient was advised to call back or seek an in-person office evaluation/go to MAU at [facility] for any urgent or concerning symptoms. Please refer to After Visit Summary for other counseling recommendations.   I provided 7 minutes of non-face-to-face time during this encounter.  No follow-ups on file.  Future Appointments  Date Time Provider Department Center  04/21/2021  2:45 PM [facility]URSE [facility]  04/21/2021  3:00 PM [facility] US1 [facility]  05/05/2021  1:30 PM [place] Bing, MD [facility]  05/19/2021  1:30 PM [place] Bing, MD [facility]    [place] Bing, MD Center for [facility], [facility]

## 2021-04-21 NOTE — Progress Notes (Signed)
I connected with  Cherl Gorney Hoog on 04/21/21 at  1:40 PM EDT by telephone and verified that I am speaking with the correct person using two identifiers.   I discussed the limitations, risks, security and privacy concerns of performing an evaluation and management service by telephone and the availability of in person appointments. I also discussed with the patient that there may be a patient responsible charge related to this service. The patient expressed understanding and agreed to proceed.  Scheryl Marten, RN 04/21/2021  2:17 PM

## 2021-04-22 ENCOUNTER — Other Ambulatory Visit: Payer: Self-pay | Admitting: *Deleted

## 2021-04-22 DIAGNOSIS — Z6838 Body mass index (BMI) 38.0-38.9, adult: Secondary | ICD-10-CM

## 2021-05-05 ENCOUNTER — Other Ambulatory Visit: Payer: Self-pay

## 2021-05-05 ENCOUNTER — Telehealth (INDEPENDENT_AMBULATORY_CARE_PROVIDER_SITE_OTHER): Payer: Medicaid Other | Admitting: Obstetrics and Gynecology

## 2021-05-05 DIAGNOSIS — Z3483 Encounter for supervision of other normal pregnancy, third trimester: Secondary | ICD-10-CM

## 2021-05-05 DIAGNOSIS — Z3A32 32 weeks gestation of pregnancy: Secondary | ICD-10-CM

## 2021-05-05 DIAGNOSIS — O3663X0 Maternal care for excessive fetal growth, third trimester, not applicable or unspecified: Secondary | ICD-10-CM

## 2021-05-05 DIAGNOSIS — O3662X0 Maternal care for excessive fetal growth, second trimester, not applicable or unspecified: Secondary | ICD-10-CM

## 2021-05-05 NOTE — Progress Notes (Signed)
Discuss US results

## 2021-05-05 NOTE — Progress Notes (Signed)
    TELEHEALTH OBSTETRICS VISIT ENCOUNTER NOTE  Provider location: Center for [facility] at [facility]   Patient location: Home  I connected with Adrienne Griffin on 05/05/21 at  1:30 PM EDT by telephone at home and verified that I am speaking with the correct person using two identifiers. Of note, unable to do video encounter due to technical difficulties.    I discussed the limitations, risks, security and privacy concerns of performing an evaluation and management service by telephone and the availability of in person appointments. I also discussed with the patient that there may be a patient responsible charge related to this service. The patient expressed understanding and agreed to proceed.  Subjective:  Adrienne Griffin is a 30 y.o. [phone] at [redacted]w[redacted]d being followed for ongoing prenatal care.  She is currently monitored for the following issues for this low-risk pregnancy and has Obesity in pregnancy; Supervision of normal pregnancy; BMI 30-39.9; UTI in pregnancy; and Short interval between pregnancies affecting pregnancy, antepartum on their problem list.  Patient reports no complaints. Reports fetal movement. Denies any contractions, bleeding or leaking of fluid.   The following portions of the patient's history were reviewed and updated as appropriate: allergies, current medications, past family history, past medical history, past social history, past surgical history and problem list.   Objective:  Last menstrual period 09/22/2020, unknown if currently breastfeeding. General:  Alert, oriented and cooperative.   Mental Status: Normal mood and affect perceived. Normal judgment and thought content.  Rest of physical exam deferred due to type of encounter  Assessment and Plan:  Pregnancy: G4P3003 at [redacted]w[redacted]d 1. Encounter for supervision of other normal pregnancy in third trimester Doing well. Bp at u/s visit normal. Has rpt growth for bmi, per mfm  2. Excessive fetal  growth affecting management of pregnancy in second trimester, single or unspecified fetus Resolved; 9/26: 12.3, 80%, 1753gm, ac 94%  Preterm labor symptoms and general obstetric precautions including but not limited to vaginal bleeding, contractions, leaking of fluid and fetal movement were reviewed in detail with the patient.  I discussed the assessment and treatment plan with the patient. The patient was provided an opportunity to ask questions and all were answered. The patient agreed with the plan and demonstrated an understanding of the instructions. The patient was advised to call back or seek an in-person office evaluation/go to MAU at [facility] for any urgent or concerning symptoms. Please refer to After Visit Summary for other counseling recommendations.   I provided 7 minutes of non-face-to-face time during this encounter.  No follow-ups on file.  Future Appointments  Date Time Provider Department Center  05/19/2021  1:30 PM [place] Bing, MD [facility]  05/26/2021 10:30 AM [facility] NURSE [facility]  05/26/2021 10:45 AM [facility] US6 [facility]    [place] Bing, MD Center for [employer], [facility]

## 2021-05-19 ENCOUNTER — Telehealth (INDEPENDENT_AMBULATORY_CARE_PROVIDER_SITE_OTHER): Payer: Medicaid Other | Admitting: Obstetrics and Gynecology

## 2021-05-19 ENCOUNTER — Other Ambulatory Visit: Payer: Self-pay

## 2021-05-19 VITALS — BP 102/55

## 2021-05-19 DIAGNOSIS — Z3A34 34 weeks gestation of pregnancy: Secondary | ICD-10-CM

## 2021-05-19 DIAGNOSIS — O99213 Obesity complicating pregnancy, third trimester: Secondary | ICD-10-CM

## 2021-05-19 DIAGNOSIS — E669 Obesity, unspecified: Secondary | ICD-10-CM

## 2021-05-19 NOTE — Progress Notes (Signed)
    TELEHEALTH OBSTETRICS VISIT ENCOUNTER NOTE  Provider location: Center for [facility] Healthcare at [facility]   Patient location: Home  I connected with Adrienne Griffin on 05/19/21 at  1:30 PM EDT by telephone at home and verified that I am speaking with the correct person using two identifiers. Of note, unable to do video encounter due to technical difficulties.    I discussed the limitations, risks, security and privacy concerns of performing an evaluation and management service by telephone and the availability of in person appointments. I also discussed with the patient that there may be a patient responsible charge related to this service. The patient expressed understanding and agreed to proceed.  Subjective:  Adrienne Griffin is a 30 y.o. [phone] at [redacted]w[redacted]d being followed for ongoing prenatal care.  She is currently monitored for the following issues for this low-risk pregnancy and has Obesity in pregnancy; Supervision of normal pregnancy; BMI 30-39.9; UTI in pregnancy; and Short interval between pregnancies affecting pregnancy, antepartum on their problem list.  Patient reports no complaints. Reports fetal movement. Denies any contractions, bleeding or leaking of fluid.   The following portions of the patient's history were reviewed and updated as appropriate: allergies, current medications, past family history, past medical history, past social history, past surgical history and problem list.   Objective:  Blood pressure (!) 102/55, last menstrual period 09/22/2020, unknown if currently breastfeeding. General:  Alert, oriented and cooperative.   Mental Status: Normal mood and affect perceived. Normal judgment and thought content.  Rest of physical exam deferred due to type of encounter  Assessment and Plan:  Pregnancy: G4P3003 at [redacted]w[redacted]d 1. [redacted] weeks gestation of pregnancy Has follow up growth due to maternal bmi in a week. Swabs nv  Preterm labor symptoms and general  obstetric precautions including but not limited to vaginal bleeding, contractions, leaking of fluid and fetal movement were reviewed in detail with the patient.  I discussed the assessment and treatment plan with the patient. The patient was provided an opportunity to ask questions and all were answered. The patient agreed with the plan and demonstrated an understanding of the instructions. The patient was advised to call back or seek an in-person office evaluation/go to MAU at [facility] for any urgent or concerning symptoms. Please refer to After Visit Summary for other counseling recommendations.   I provided 7 minutes of non-face-to-face time during this encounter.  No follow-ups on file.  Future Appointments  Date Time Provider Department Center  05/19/2021  1:30 PM [place] Bing, MD [facility]  05/26/2021 10:30 AM [facility] NURSE [facility]  05/26/2021 10:45 AM [facility] US6 [facility]    [place] Bing, MD Center for [employer], [facility]

## 2021-05-19 NOTE — Progress Notes (Signed)
I connected with  Adrienne Griffin on 05/19/21 at  1:30 PM EDT by telephone and verified that I am speaking with the correct person using two identifiers.   I discussed the limitations, risks, security and privacy concerns of performing an evaluation and management service by telephone and the availability of in person appointments. I also discussed with the patient that there may be a patient responsible charge related to this service. The patient expressed understanding and agreed to proceed.  Scheryl Marten, RN 05/19/2021  1:24 PM

## 2021-05-26 ENCOUNTER — Encounter: Payer: Self-pay | Admitting: *Deleted

## 2021-05-26 ENCOUNTER — Ambulatory Visit: Payer: Medicaid Other | Admitting: *Deleted

## 2021-05-26 ENCOUNTER — Ambulatory Visit: Payer: Medicaid Other | Attending: Obstetrics

## 2021-05-26 ENCOUNTER — Other Ambulatory Visit: Payer: Self-pay

## 2021-05-26 DIAGNOSIS — O99213 Obesity complicating pregnancy, third trimester: Secondary | ICD-10-CM | POA: Diagnosis not present

## 2021-05-26 DIAGNOSIS — O09893 Supervision of other high risk pregnancies, third trimester: Secondary | ICD-10-CM | POA: Diagnosis not present

## 2021-05-26 DIAGNOSIS — E669 Obesity, unspecified: Secondary | ICD-10-CM | POA: Diagnosis not present

## 2021-05-26 DIAGNOSIS — Z3A35 35 weeks gestation of pregnancy: Secondary | ICD-10-CM | POA: Diagnosis not present

## 2021-05-26 DIAGNOSIS — Z362 Encounter for other antenatal screening follow-up: Secondary | ICD-10-CM | POA: Insufficient documentation

## 2021-05-26 DIAGNOSIS — Z6838 Body mass index (BMI) 38.0-38.9, adult: Secondary | ICD-10-CM

## 2021-06-04 ENCOUNTER — Other Ambulatory Visit: Payer: Self-pay

## 2021-06-04 ENCOUNTER — Telehealth (INDEPENDENT_AMBULATORY_CARE_PROVIDER_SITE_OTHER): Payer: Medicaid Other | Admitting: Family Medicine

## 2021-06-04 VITALS — BP 111/71

## 2021-06-04 DIAGNOSIS — Z3A37 37 weeks gestation of pregnancy: Secondary | ICD-10-CM | POA: Diagnosis not present

## 2021-06-04 DIAGNOSIS — O09899 Supervision of other high risk pregnancies, unspecified trimester: Secondary | ICD-10-CM

## 2021-06-04 DIAGNOSIS — Z3483 Encounter for supervision of other normal pregnancy, third trimester: Secondary | ICD-10-CM

## 2021-06-04 NOTE — Progress Notes (Signed)
I connected with  Gearl Kimbrough Sky on 06/04/21 at  9:15 AM EST by telephone and verified that I am speaking with the correct person using two identifiers.   I discussed the limitations, risks, security and privacy concerns of performing an evaluation and management service by telephone and the availability of in person appointments. I also discussed with the patient that there may be a patient responsible charge related to this service. The patient expressed understanding and agreed to proceed.  Scheryl Marten, RN 06/04/2021  9:41 AM

## 2021-06-04 NOTE — Patient Instructions (Signed)

## 2021-06-04 NOTE — Progress Notes (Signed)
OBSTETRICS PRENATAL VIRTUAL VISIT ENCOUNTER NOTE  Provider location: Center for [facility] at [facility]   Patient location: Home  I connected with Adrienne Griffin on 06/04/21 at  9:15 AM EST by MyChart Video Encounter and verified that I am speaking with the correct person using two identifiers. I discussed the limitations, risks, security and privacy concerns of performing an evaluation and management service virtually and the availability of in person appointments. I also discussed with the patient that there may be a patient responsible charge related to this service. The patient expressed understanding and agreed to proceed. Subjective:  Adrienne Griffin is a 30 y.o. [phone] at [redacted]w[redacted]d being seen today for ongoing prenatal care.  She is currently monitored for the following issues for this low-risk pregnancy and has Obesity in pregnancy; Supervision of normal pregnancy; BMI 30-39.9; UTI in pregnancy; and Short interval between pregnancies affecting pregnancy, antepartum on their problem list.  Patient reports contractions since last few days .  Contractions: Irritability. Vag. Bleeding: None.  Movement: Present. Denies any leaking of fluid.   The following portions of the patient's history were reviewed and updated as appropriate: allergies, current medications, past family history, past medical history, past social history, past surgical history and problem list.   Objective:   Vitals:   06/04/21 0940  BP: 111/71    Fetal Status:     Movement: Present     General:  Alert, oriented and cooperative. Patient is in no acute distress.  Respiratory: Normal respiratory effort, no problems with respiration noted  Mental Status: Normal mood and affect. Normal behavior. Normal judgment and thought content.  Rest of physical exam deferred due to type of encounter  Imaging: [place] MFM OB FOLLOW UP  Result Date:  05/26/2021 ----------------------------------------------------------------------  OBSTETRICS REPORT                       (Signed Final 05/26/2021 11:43 am) ---------------------------------------------------------------------- Patient Info  ID #:       [id number]                          D.O.B.:  1991-07-08 (30 yrs)  Name:       Adrienne Griffin            Visit Date: 05/26/2021 11:12 am ---------------------------------------------------------------------- Performed By  Attending:        Ma Rings MD         Ref. Address:     [address]                                                             [place], [place]  [address]  Performed By:     Redgie Grayer      [facility]  Referred By:      Reva Bores                    MD ---------------------------------------------------------------------- Orders  #  Description                           Code        Ordered By  1  [place] MFM OB FOLLOW UP                   [id number]    YU FANG ----------------------------------------------------------------------  #  Order #                     Accession #                Episode #  1  [id number]                   [id number]                 [id number] ---------------------------------------------------------------------- Indications  Obesity complicating pregnancy (BMI 38)        O99.210 E66.9  Short interval between pregancies, 3rd         O09.893  trimester  [redacted] weeks gestation of pregnancy                Z3A.35  Encounter for other antenatal screening        Z36.2  follow-up  Low Risk NIPS ---------------------------------------------------------------------- Fetal  Evaluation  Num Of Fetuses:         1  Fetal Heart Rate(bpm):  145  Cardiac Activity:       Observed  Presentation:           Cephalic  Placenta:               Anterior  P. Cord Insertion:      Previously Visualized  Amniotic Fluid  AFI FV:      Within normal limits  AFI Sum(cm)     %Tile       Largest Pocket(cm)  10.2            22  4  RUQ(cm)       RLQ(cm)       LUQ(cm)        LLQ(cm)  0             4             3.4            2.8 ---------------------------------------------------------------------- Biometry  BPD:      85.2  mm     G. Age:  34w 2d         30  %    CI:         80.9   %    70 - 86                                                          FL/HC:      22.6   %    20.1 - 22.3  HC:      299.1  mm     G. Age:  33w 1d        < 1  %    HC/AC:      0.93        0.93 - 1.11  AC:      322.9  mm     G. Age:  36w 1d         84  %    FL/BPD:     79.5   %    71 - 87  FL:       67.7  mm     G. Age:  34w 6d         34  %    FL/AC:      21.0   %    20 - 24  LV:        4.6  mm  Est. FW:    2643  gm    5 lb 13 oz      52  % ---------------------------------------------------------------------- OB History  Blood Type:   O+  Gravidity:    4         Term:   3 ---------------------------------------------------------------------- Gestational Age  LMP:           35w 1d        Date:  09/22/20                 EDD:   06/29/21  U/S Today:     34w 4d                                        EDD:   07/03/21  Best:          35w 1d     Det. By:  LMP  (09/22/20)          EDD:   06/29/21 ---------------------------------------------------------------------- Anatomy  Cranium:               Appears normal         Aortic Arch:            Previously seen  Cavum:                 Appears  normal         Ductal Arch:            Previously seen  Ventricles:            Appears normal         Diaphragm:              Appears normal  Choroid Plexus:        Previously seen        Stomach:                Appears normal, left                                                                         sided  Cerebellum:            Previously seen        Abdomen:                Appears normal  Posterior Fossa:       Previously seen        Abdominal Wall:         Previously seen  Nuchal Fold:           Previously seen        Cord Vessels:           Previously seen  Face:                  Orbits and profile     Kidneys:                Appear normal                         previously seen  Lips:                  Previously seen        Bladder:                Appears normal  Thoracic:              Appears normal         Spine:                  Previously seen  Heart:                 Previously seen        Upper Extremities:      Previously seen  RVOT:                  Previously seen        Lower Extremities:      Previously seen  LVOT:                  Previously seen  Other:  Fetus appears to be female. Nasal bone visualized. ---------------------------------------------------------------------- Cervix Uterus Adnexa  Cervix  Not visualized (advanced GA >24wks)  Uterus  No abnormality visualized.  Right Ovary  Not visualized.  Left Ovary  Not visualized.  Adnexa  No adnexal mass visualized. ---------------------------------------------------------------------- Comments  This patient was seen for a follow up growth scan due to  maternal  obesity with a BMI of 38.  She denies any problems  since her last exam.  She was informed that the fetal growth and amniotic fluid  level appears appropriate for her gestational age.  As the fetal growth is within normal limits, no further exams  were scheduled in our office. ----------------------------------------------------------------------                   Ma Rings, MD Electronically Signed Final Report   05/26/2021 11:43 am ----------------------------------------------------------------------   Assessment and Plan:  Pregnancy: [id number] at [redacted]w[redacted]d 1. Encounter for supervision of other normal pregnancy in third  trimester Continue routine prenatal care. Cultures when in person next week  2. Short interval between pregnancies affecting pregnancy, antepartum   Preterm labor symptoms and general obstetric precautions including but not limited to vaginal bleeding, contractions, leaking of fluid and fetal movement were reviewed in detail with the patient. I discussed the assessment and treatment plan with the patient. The patient was provided an opportunity to ask questions and all were answered. The patient agreed with the plan and demonstrated an understanding of the instructions. The patient was advised to call back or seek an in-person office evaluation/go to MAU at [facility] for any urgent or concerning symptoms. Please refer to After Visit Summary for other counseling recommendations.   I provided 4 minutes of face-to-face time during this encounter.  Return in 1 week (on 06/11/2021) for [facility], in person.  Future Appointments  Date Time Provider Department Center  06/09/2021  1:10 PM Federico Flake, MD [facility]  06/16/2021  1:50 PM Anyanwu, Jethro Bastos, MD [facility]  06/23/2021  3:50 PM Constant, Gigi Gin, MD [facility]  06/30/2021  3:50 PM Anyanwu, Jethro Bastos, MD [facility]    Reva Bores, MD Center for [facility], [facility]

## 2021-06-09 ENCOUNTER — Other Ambulatory Visit (HOSPITAL_COMMUNITY)
Admission: RE | Admit: 2021-06-09 | Discharge: 2021-06-09 | Disposition: A | Discharge status: Home / Self Care | Payer: Medicaid Other | Source: Ambulatory Visit | Attending: Family Medicine | Admitting: Family Medicine

## 2021-06-09 ENCOUNTER — Other Ambulatory Visit: Payer: Self-pay

## 2021-06-09 ENCOUNTER — Ambulatory Visit (INDEPENDENT_AMBULATORY_CARE_PROVIDER_SITE_OTHER): Payer: Medicaid Other | Admitting: Family Medicine

## 2021-06-09 VITALS — BP 110/75 | HR 103 | Wt 195.0 lb

## 2021-06-09 DIAGNOSIS — O99210 Obesity complicating pregnancy, unspecified trimester: Secondary | ICD-10-CM

## 2021-06-09 DIAGNOSIS — O09899 Supervision of other high risk pregnancies, unspecified trimester: Secondary | ICD-10-CM

## 2021-06-09 DIAGNOSIS — O09893 Supervision of other high risk pregnancies, third trimester: Secondary | ICD-10-CM

## 2021-06-09 DIAGNOSIS — Z3483 Encounter for supervision of other normal pregnancy, third trimester: Secondary | ICD-10-CM | POA: Insufficient documentation

## 2021-06-09 DIAGNOSIS — Z23 Encounter for immunization: Secondary | ICD-10-CM

## 2021-06-09 DIAGNOSIS — O99213 Obesity complicating pregnancy, third trimester: Secondary | ICD-10-CM

## 2021-06-09 DIAGNOSIS — Z3A37 37 weeks gestation of pregnancy: Secondary | ICD-10-CM

## 2021-06-09 LAB — OB RESULTS CONSOLE GC/CHLAMYDIA: Gonorrhea: NEGATIVE

## 2021-06-09 NOTE — Progress Notes (Signed)
   PRENATAL VISIT NOTE  Subjective:  Adrienne Griffin is a 30 y.o. [phone] at [redacted]w[redacted]d being seen today for ongoing prenatal care.  She is currently monitored for the following issues for this high-risk pregnancy and has Obesity in pregnancy; Supervision of normal pregnancy; BMI 30-39.9; UTI in pregnancy; and Short interval between pregnancies affecting pregnancy, antepartum on their problem list.  Patient reports no complaints.  Contractions: Irritability. Vag. Bleeding: None.  Movement: Present. Denies leaking of fluid.   The following portions of the patient's history were reviewed and updated as appropriate: allergies, current medications, past family history, past medical history, past social history, past surgical history and problem list.   Objective:   Vitals:   06/09/21 1329  BP: 110/75  Pulse: (!) 103  Weight: 195 lb (88.5 kg)    Fetal Status: Fetal Heart Rate (bpm): 159   Movement: Present     General:  Alert, oriented and cooperative. Patient is in no acute distress.  Skin: Skin is warm and dry. No rash noted.   Cardiovascular: Normal heart rate noted  Respiratory: Normal respiratory effort, no problems with respiration noted  Abdomen: Soft, gravid, appropriate for gestational age.  Pain/Pressure: Present     Pelvic: Cervical exam deferred collected swabs        Extremities: Normal range of motion.  Edema: None  Mental Status: Normal mood and affect. Normal behavior. Normal judgment and thought content.   Assessment and Plan:  Pregnancy: G4P3003 at [redacted]w[redacted]d 1. Encounter for supervision of other normal pregnancy in third trimester Up to date Reviewed plan for IOl at 41 wks if undelivered. Discussed planning for this at 39 wks and checking cervix at that time.  - Strep Gp B NAA - Cervicovaginal ancillary only( [place])  2. Short interval between pregnancies affecting pregnancy, antepartum  3. Obesity in pregnancy TWG=6 lb (2.722 kg) which is at goal  Term labor  symptoms and general obstetric precautions including but not limited to vaginal bleeding, contractions, leaking of fluid and fetal movement were reviewed in detail with the patient. Please refer to After Visit Summary for other counseling recommendations.   No follow-ups on file.  Future Appointments  Date Time Provider Department Center  06/16/2021  1:50 PM Anyanwu, Jethro Bastos, MD [facility]  06/23/2021  3:50 PM Constant, Gigi Gin, MD [facility]  06/30/2021  3:50 PM Anyanwu, Jethro Bastos, MD [facility]    Federico Flake, MD

## 2021-06-09 NOTE — Addendum Note (Signed)
Addended by: Kennon Portela on: 06/09/2021 02:29 PM   Modules accepted: Orders

## 2021-06-10 LAB — CERVICOVAGINAL ANCILLARY ONLY
Chlamydia: NEGATIVE
Comment: NEGATIVE
Comment: NEGATIVE
Comment: NORMAL
Neisseria Gonorrhea: NEGATIVE
Trichomonas: NEGATIVE

## 2021-06-11 LAB — STREP GP B NAA: Strep Gp B NAA: NEGATIVE

## 2021-06-16 ENCOUNTER — Other Ambulatory Visit: Payer: Self-pay

## 2021-06-16 ENCOUNTER — Encounter: Payer: Self-pay | Admitting: Obstetrics & Gynecology

## 2021-06-16 ENCOUNTER — Telehealth (INDEPENDENT_AMBULATORY_CARE_PROVIDER_SITE_OTHER): Payer: Medicaid Other | Admitting: Obstetrics & Gynecology

## 2021-06-16 DIAGNOSIS — Z3483 Encounter for supervision of other normal pregnancy, third trimester: Secondary | ICD-10-CM

## 2021-06-16 DIAGNOSIS — Z3A38 38 weeks gestation of pregnancy: Secondary | ICD-10-CM

## 2021-06-16 NOTE — Progress Notes (Signed)
OBSTETRICS PRENATAL VIRTUAL VISIT ENCOUNTER NOTE  Provider location: [facility] for [facility] at [facility]   Patient location: Home  I connected with Adrienne Citrin Holtman on 06/16/21 at  1:50 PM EST by MyChart Video Encounter and verified that I am speaking with the correct person using two identifiers. I discussed the limitations, risks, security and privacy concerns of performing an evaluation and management service virtually and the availability of in person appointments. I also discussed with the patient that there may be a patient responsible charge related to this service. The patient expressed understanding and agreed to proceed. Subjective:  Adrienne Griffin is a 30 y.o. [phone] at [redacted]w[redacted]d being seen today for ongoing prenatal care.  She is currently monitored for the following issues for this low-risk pregnancy and has Obesity in pregnancy; Supervision of normal pregnancy; BMI 30-39.9; UTI in pregnancy; and Short interval between pregnancies affecting pregnancy, antepartum on their problem list.  Patient reports no complaints.  Contractions: Irritability. Vag. Bleeding: None.  Movement: Present. Denies any leaking of fluid.   The following portions of the patient's history were reviewed and updated as appropriate: allergies, current medications, past family history, past medical history, past social history, past surgical history and problem list.   Objective:  There were no vitals filed for this visit.  Fetal Status:     Movement: Present     General:  Alert, oriented and cooperative. Patient is in no acute distress.  Respiratory: Normal respiratory effort, no problems with respiration noted  Mental Status: Normal mood and affect. Normal behavior. Normal judgment and thought content.  Rest of physical exam deferred due to type of encounter  Imaging: [place] MFM OB FOLLOW UP  Result Date: 05/26/2021 ----------------------------------------------------------------------   OBSTETRICS REPORT                       (Signed Final 05/26/2021 11:43 am) ---------------------------------------------------------------------- Patient Info  ID #:       [id number]                          D.O.B.:  07/06/1991 (30 yrs)  Name:       Adrienne Griffin            Visit Date: 05/26/2021 11:12 am ---------------------------------------------------------------------- Performed By  Attending:        Ma Rings MD         Ref. Address:     [address]                                                             [place], [place]  [address]  Performed By:     Redgie Grayer      [facility]  Referred By:      Reva Bores                    MD ---------------------------------------------------------------------- Orders  #  Description                           Code        Ordered By  1  [place] MFM OB FOLLOW UP                   [phone]    YU FANG ----------------------------------------------------------------------  #  Order #                     Accession #                Episode #  1  [id number]                   [id number]                 [id number] ---------------------------------------------------------------------- Indications  Obesity complicating pregnancy (BMI 38)        O99.210 E66.9  Short interval between pregancies, 3rd         O09.893  trimester  [redacted] weeks gestation of pregnancy                Z3A.35  Encounter for other antenatal screening        Z36.2  follow-up  Low Risk NIPS ---------------------------------------------------------------------- Fetal Evaluation  Num Of Fetuses:         1  Fetal Heart Rate(bpm):  145  Cardiac  Activity:       Observed  Presentation:           Cephalic  Placenta:               Anterior  P. Cord Insertion:      Previously Visualized  Amniotic Fluid  AFI FV:      Within normal limits  AFI Sum(cm)     %Tile       Largest Pocket(cm)  10.2            22  4  RUQ(cm)       RLQ(cm)       LUQ(cm)        LLQ(cm)  0             4             3.4            2.8 ---------------------------------------------------------------------- Biometry  BPD:      85.2  mm     G. Age:  34w 2d         30  %    CI:         80.9   %    70 - 86                                                          FL/HC:      22.6   %    20.1 - 22.3  HC:      299.1  mm     G. Age:  33w 1d        < 1  %    HC/AC:      0.93        0.93 - 1.11  AC:      322.9  mm     G. Age:  36w 1d         84  %    FL/BPD:     79.5   %    71 - 87  FL:       67.7  mm     G. Age:  34w 6d         34  %    FL/AC:      21.0   %    20 - 24  LV:        4.6  mm  Est. FW:    2643  gm    5 lb 13 oz      52  % ---------------------------------------------------------------------- OB History  Blood Type:   O+  Gravidity:    4         Term:   3 ---------------------------------------------------------------------- Gestational Age  LMP:           35w 1d        Date:  09/22/20                 EDD:   06/29/21  U/S Today:     34w 4d                                        EDD:   07/03/21  Best:          35w 1d     Det. By:  LMP  (09/22/20)          EDD:   06/29/21 ---------------------------------------------------------------------- Anatomy  Cranium:               Appears normal         Aortic Arch:            Previously seen  Cavum:                 Appears  normal         Ductal Arch:            Previously seen  Ventricles:            Appears normal         Diaphragm:              Appears normal  Choroid Plexus:        Previously seen        Stomach:                Appears normal, left                                                                        sided  Cerebellum:             Previously seen        Abdomen:                Appears normal  Posterior Fossa:       Previously seen        Abdominal Wall:         Previously seen  Nuchal Fold:           Previously seen        Cord Vessels:           Previously seen  Face:                  Orbits and profile     Kidneys:                Appear normal                         previously seen  Lips:                  Previously seen        Bladder:                Appears normal  Thoracic:              Appears normal         Spine:                  Previously seen  Heart:                 Previously seen        Upper Extremities:      Previously seen  RVOT:                  Previously seen        Lower Extremities:      Previously seen  LVOT:                  Previously seen  Other:  Fetus appears to be female. Nasal bone visualized. ---------------------------------------------------------------------- Cervix Uterus Adnexa  Cervix  Not visualized (advanced GA >24wks)  Uterus  No abnormality visualized.  Right Ovary  Not visualized.  Left Ovary  Not visualized.  Adnexa  No adnexal mass visualized. ---------------------------------------------------------------------- Comments  This patient was seen for a follow up growth scan due to  maternal  obesity with a BMI of 38.  She denies any problems  since her last exam.  She was informed that the fetal growth and amniotic fluid  level appears appropriate for her gestational age.  As the fetal growth is within normal limits, no further exams  were scheduled in our office. ----------------------------------------------------------------------                   Ma Rings, MD Electronically Signed Final Report   05/26/2021 11:43 am ----------------------------------------------------------------------   Assessment and Plan:  Pregnancy: [id number] at [redacted]w[redacted]d 1. [redacted] weeks gestation of pregnancy 2. Encounter for supervision of other normal pregnancy in third trimester No concerns.  Term labor symptoms and  general obstetric precautions including but not limited to vaginal bleeding, contractions, leaking of fluid and fetal movement were reviewed in detail with the patient. I discussed the assessment and treatment plan with the patient. The patient was provided an opportunity to ask questions and all were answered. The patient agreed with the plan and demonstrated an understanding of the instructions. The patient was advised to call back or seek an in-person office evaluation/go to MAU at [facility] for any urgent or concerning symptoms. Please refer to After Visit Summary for other counseling recommendations.   I provided 7 minutes of face-to-face time during this encounter.  Return in about 1 week (around 06/23/2021) for OB visits as scheduled.  Future Appointments  Date Time Provider Department Center  06/23/2021  3:50 PM Constant, Gigi Gin, MD [facility]  06/30/2021  3:50 PM Kimisha Eunice, Jethro Bastos, MD [facility]    Jaynie Collins, MD Center for [facility], [facility]

## 2021-06-16 NOTE — Patient Instructions (Signed)
Return to office for any scheduled appointments. Call the office or go to the MAU at [facility] [place] if:  You begin to have strong, frequent contractions  Your water breaks.  Sometimes it is a big gush of fluid, sometimes it is just a trickle that keeps getting your panties wet or running down your legs  You have vaginal bleeding.  It is normal to have a small amount of spotting if your cervix was checked.   You do not feel your baby moving like normal.  If you do not, get something to eat and drink and lay down and focus on feeling your baby move.   If your baby is still not moving like normal, you should call the office or go to MAU.  Any other obstetric concerns.   

## 2021-06-23 ENCOUNTER — Encounter (HOSPITAL_COMMUNITY): Payer: Self-pay | Admitting: Obstetrics and Gynecology

## 2021-06-23 ENCOUNTER — Inpatient Hospital Stay (HOSPITAL_COMMUNITY): Payer: Medicaid Other | Admitting: Anesthesiology

## 2021-06-23 ENCOUNTER — Inpatient Hospital Stay (HOSPITAL_COMMUNITY)
Admission: AD | Admit: 2021-06-23 | Discharge: 2021-06-23 | Disposition: A | Discharge status: Home / Self Care | Payer: Medicaid Other | Source: Home / Self Care | Attending: Obstetrics and Gynecology | Admitting: Obstetrics and Gynecology

## 2021-06-23 ENCOUNTER — Other Ambulatory Visit: Payer: Self-pay

## 2021-06-23 ENCOUNTER — Encounter: Payer: Medicaid Other | Admitting: Obstetrics and Gynecology

## 2021-06-23 ENCOUNTER — Inpatient Hospital Stay (HOSPITAL_COMMUNITY)
Admission: AD | Admit: 2021-06-23 | Discharge: 2021-06-25 | DRG: 807 | Disposition: A | Discharge status: Home / Self Care | Payer: Medicaid Other | Attending: Obstetrics and Gynecology | Admitting: Obstetrics and Gynecology

## 2021-06-23 DIAGNOSIS — O26893 Other specified pregnancy related conditions, third trimester: Secondary | ICD-10-CM | POA: Diagnosis not present

## 2021-06-23 DIAGNOSIS — O471 False labor at or after 37 completed weeks of gestation: Secondary | ICD-10-CM | POA: Insufficient documentation

## 2021-06-23 DIAGNOSIS — D649 Anemia, unspecified: Secondary | ICD-10-CM | POA: Diagnosis not present

## 2021-06-23 DIAGNOSIS — O9902 Anemia complicating childbirth: Secondary | ICD-10-CM | POA: Diagnosis not present

## 2021-06-23 DIAGNOSIS — Z3A39 39 weeks gestation of pregnancy: Secondary | ICD-10-CM

## 2021-06-23 DIAGNOSIS — O479 False labor, unspecified: Secondary | ICD-10-CM

## 2021-06-23 DIAGNOSIS — Z20822 Contact with and (suspected) exposure to covid-19: Secondary | ICD-10-CM | POA: Diagnosis present

## 2021-06-23 DIAGNOSIS — O99214 Obesity complicating childbirth: Secondary | ICD-10-CM | POA: Diagnosis not present

## 2021-06-23 HISTORY — DX: Urinary tract infection, site not specified: N39.0

## 2021-06-23 LAB — RESP PANEL BY RT-PCR (FLU A&B, COVID) ARPGX2
Influenza A by PCR: NEGATIVE
Influenza B by PCR: NEGATIVE
SARS Coronavirus 2 by RT PCR: NEGATIVE

## 2021-06-23 LAB — CBC
HCT: 34 % — ABNORMAL LOW (ref 36.0–46.0)
Hemoglobin: 10.6 g/dL — ABNORMAL LOW (ref 12.0–15.0)
MCH: 25.7 pg — ABNORMAL LOW (ref 26.0–34.0)
MCHC: 31.2 g/dL (ref 30.0–36.0)
MCV: 82.3 fL (ref 80.0–100.0)
Platelets: 323 10*3/uL (ref 150–400)
RBC: 4.13 MIL/uL (ref 3.87–5.11)
RDW: 14.8 % (ref 11.5–15.5)
WBC: 8.7 10*3/uL (ref 4.0–10.5)
nRBC: 0 % (ref 0.0–0.2)

## 2021-06-23 LAB — TYPE AND SCREEN
ABO/RH(D): O POS
Antibody Screen: NEGATIVE

## 2021-06-23 MED ORDER — OXYTOCIN BOLUS FROM INFUSION
333.0000 mL | Freq: Once | INTRAVENOUS | Status: AC
  Administered 2021-06-23: 23:00:00 333 mL via INTRAVENOUS

## 2021-06-23 MED ORDER — FLEET ENEMA 7-19 GM/118ML RE ENEM: 1.0000 | ENEMA | RECTAL | Status: DC | PRN

## 2021-06-23 MED ORDER — FENTANYL CITRATE (PF) 100 MCG/2ML IJ SOLN: 50.0000 ug | INTRAMUSCULAR | Status: DC | PRN

## 2021-06-23 MED ORDER — LACTATED RINGERS IV SOLN
500.0000 mL | INTRAVENOUS | Status: DC | PRN
Start: 2021-06-23 — End: 2021-06-24

## 2021-06-23 MED ORDER — OXYTOCIN-SODIUM CHLORIDE 30-0.9 UT/500ML-% IV SOLN
2.5000 [IU]/h | INTRAVENOUS | Status: DC
  Filled 2021-06-23: qty 500

## 2021-06-23 MED ORDER — PHENYLEPHRINE 40 MCG/ML (10ML) SYRINGE FOR IV PUSH (FOR BLOOD PRESSURE SUPPORT)
80.0000 ug | PREFILLED_SYRINGE | INTRAVENOUS | Status: DC | PRN
  Filled 2021-06-23: qty 10

## 2021-06-23 MED ORDER — ONDANSETRON HCL 4 MG/2ML IJ SOLN: 4.0000 mg | Freq: Four times a day (QID) | INTRAMUSCULAR | Status: DC | PRN

## 2021-06-23 MED ORDER — PHENYLEPHRINE 40 MCG/ML (10ML) SYRINGE FOR IV PUSH (FOR BLOOD PRESSURE SUPPORT): 80.0000 ug | PREFILLED_SYRINGE | INTRAVENOUS | Status: DC | PRN

## 2021-06-23 MED ORDER — ACETAMINOPHEN 325 MG PO TABS: 650.0000 mg | ORAL_TABLET | ORAL | Status: DC | PRN

## 2021-06-23 MED ORDER — OXYCODONE-ACETAMINOPHEN 5-325 MG PO TABS: 2.0000 | ORAL_TABLET | ORAL | Status: DC | PRN

## 2021-06-23 MED ORDER — DIPHENHYDRAMINE HCL 50 MG/ML IJ SOLN: 12.5000 mg | INTRAMUSCULAR | Status: DC | PRN

## 2021-06-23 MED ORDER — EPHEDRINE 5 MG/ML INJ: 10.0000 mg | INTRAVENOUS | Status: DC | PRN

## 2021-06-23 MED ORDER — SOD CITRATE-CITRIC ACID 500-334 MG/5ML PO SOLN: 30.0000 mL | ORAL | Status: DC | PRN

## 2021-06-23 MED ORDER — LACTATED RINGERS IV SOLN: INTRAVENOUS | Status: DC

## 2021-06-23 MED ORDER — LACTATED RINGERS IV SOLN
500.0000 mL | Freq: Once | INTRAVENOUS | Status: AC
  Administered 2021-06-23: 22:00:00 500 mL via INTRAVENOUS

## 2021-06-23 MED ORDER — LIDOCAINE HCL (PF) 1 % IJ SOLN: 30.0000 mL | INTRAMUSCULAR | Status: DC | PRN

## 2021-06-23 MED ORDER — LACTATED RINGERS IV SOLN: 500.0000 mL | Freq: Once | INTRAVENOUS | Status: DC

## 2021-06-23 MED ORDER — HYDROXYZINE HCL 50 MG PO TABS: 50.0000 mg | ORAL_TABLET | Freq: Four times a day (QID) | ORAL | Status: DC | PRN

## 2021-06-23 MED ORDER — LIDOCAINE HCL (PF) 1 % IJ SOLN
INTRAMUSCULAR | Status: DC | PRN
  Administered 2021-06-23: 2 mL via EPIDURAL
  Administered 2021-06-23: 3 mL via EPIDURAL
  Administered 2021-06-23: 5 mL via EPIDURAL

## 2021-06-23 MED ORDER — FENTANYL-BUPIVACAINE-NACL 0.5-0.125-0.9 MG/250ML-% EP SOLN
12.0000 mL/h | EPIDURAL | Status: DC | PRN
  Administered 2021-06-23: 22:00:00 12 mL/h via EPIDURAL
  Filled 2021-06-23: qty 250

## 2021-06-23 MED ORDER — OXYCODONE-ACETAMINOPHEN 5-325 MG PO TABS: 1.0000 | ORAL_TABLET | ORAL | Status: DC | PRN

## 2021-06-23 NOTE — H&P (Signed)
OBSTETRIC ADMISSION HISTORY AND PHYSICAL  Adrienne Griffin is a 30 y.o. female [phone] with IUP at 55w1dby LMP presenting for SOL with contractions every 2 minutes. She reports +FMs, No LOF, no VB, no blurry vision, headaches or peripheral edema, and RUQ pain.  She plans on bottle and breast feeding. She hasn't decided on birth control. [facility]  Dating: By LMP --->  Estimated Date of Delivery: 06/29/21  Sono:    @[redacted]w[redacted]d , CWD, normal anatomy, variable presentation, anterior placental lie, 438g, 99% EFW  Prenatal History/Complications:  Obesity (BMI 344  Past Medical History: Past Medical History:  Diagnosis Date   Medical history non-contributory    UTI (urinary tract infection)     Past Surgical History: Past Surgical History:  Procedure Laterality Date   WISDOM TOOTH EXTRACTION  2010    Obstetrical History: OB History     Gravida  4   Para  3   Term  3   Preterm      AB      Living  3      SAB      IAB      Ectopic      Multiple  0   Live Births  3           Social History Social History   Socioeconomic History   Marital status: Married    Spouse name: Not on file   Number of children: Not on file   Years of education: Not on file   Highest education level: Not on file  Occupational History   Not on file  Tobacco Use   Smoking status: Never   Smokeless tobacco: Never  Vaping Use   Vaping Use: Never used  Substance and Sexual Activity   Alcohol use: Not Currently    Comment: occasional pre-pregnancy   Drug use: No   Sexual activity: Yes    Partners: Male    Birth control/protection: None  Other Topics Concern   Not on file  Social History Narrative   Not on file   Social Determinants of Health   Financial Resource Strain: Not on file  Food Insecurity: Not on file  Transportation Needs: Not on file  Physical Activity: Not on file  Stress: Not on file  Social Connections: Not  on file    Family History: Family History  Problem Relation Age of Onset   Hypertension Mother    Diabetes Mother    Healthy Father     Allergies: No Known Allergies  Medications Prior to Admission  Medication Sig Dispense Refill Last Dose   Prenat w/o A Vit-FeFum-FePo-FA (CONCEPT OB) 130-92.4-1 MG CAPS Take 1 tablet by mouth daily. 30 capsule 12 06/23/2021     Review of Systems   All systems reviewed and negative except as stated in HPI  Blood pressure 122/78, pulse (!) 104, last menstrual period 09/22/2020, unknown if currently breastfeeding. General appearance: alert, cooperative, and no distress Lungs: no increased work of breathing Heart: warm and well perfused Abdomen: soft, non-tender, gravid uterus Extremities: no edema or calf tenderness Presentation: cephalic Fetal monitoringBaseline: 140 bpm, Variability: Good {> 6 bpm), Accelerations: Reactive, and Decelerations: Absent Uterine activity3-4 minutes Dilation: 6 Effacement (%): 90 Station: -2 Exam by:: Madison, RN   Prenatal labs: ABO, Rh: O/Positive/-- (05/12 1426) Antibody: Negative (05/12 1426) Rubella: 1.35 (05/12 1426) RPR: Non Reactive (09/12 1332)  HBsAg: Negative (05/12 1426)  HIV: Non Reactive (09/12 1325)  GBS: Negative/-- (11/14 1426)  2 hr Glucola: Normal Genetic screening: Declined NIPS, AFP; previously negative Careers information officer [place]: Normal  Prenatal Transfer Tool  Maternal Diabetes: No Genetic Screening: Declined Maternal Ultrasounds/Referrals: Normal Fetal Ultrasounds or other Referrals:  None Maternal Substance Abuse:  No Significant Maternal Medications:  None Significant Maternal Lab Results: Group B Strep negative  No results found for this or any previous visit (from the past 24 hour(s)).  Patient Active Problem List   Diagnosis Date Noted   Supervision of normal pregnancy 07/28/2018   BMI 30-39.9 07/28/2018   UTI in pregnancy 07/28/2018   Short interval between pregnancies  affecting pregnancy, antepartum 07/28/2018   Obesity in pregnancy 04/24/2015    Assessment/Plan:  Adrienne Griffin is a 30 y.o. G4P3003 at 47w1dhere for SOL.  #Labor:AROM @ 24742w/clear fluid. Cx now 9/90/-2 #Pain: Epidural in place, comfortable #FWB: Cat 1 ##MOF: breast and bottle   I personally saw and evaluated the patient, performing the key elements of the service. I developed and verified the management plan that is described in the resident's/student's note, and I agree with the content with my edits above. VSS, HRR&R, Resp unlabored, Legs neg.  FNigel Griffin CNM 06/23/2021 10:49 PM     VLuanna SalkRKatha Cabal MD  06/23/2021, 9:22 PM

## 2021-06-23 NOTE — Discharge Summary (Signed)
Postpartum Discharge Summary  Date of Service updated11/30/22     Patient Name: Adrienne Griffin DOB: 27-Jan-1991 MRN: [id number]  Date of admission: 06/23/2021 Delivery date:06/23/2021  Delivering provider: Christin Fudge  Date of discharge: 06/25/2021  Admitting diagnosis: Indication for care in labor or delivery [O75.9] Intrauterine pregnancy: [redacted]w[redacted]d    Secondary diagnosis:  Active Problems:   Indication for care in labor or delivery  Additional problems: none    Discharge diagnosis: Term Pregnancy Delivered                                              Post partum procedures: none Augmentation: AROM Complications: None  Hospital course: Onset of Labor With Vaginal Delivery      30y.o. yo [phone] 378w1das admitted in Active Labor on 06/23/2021. Patient had an uncomplicated labor course as follows:  Membrane Rupture Time/Date: 10:37 PM ,06/23/2021   Delivery Method:Vaginal, Spontaneous  Episiotomy: None  Lacerations:  None  Patient had an uncomplicated postpartum course.  She is ambulating, tolerating a regular diet, passing flatus, and urinating well. Patient is discharged home in stable condition on 06/25/21.  Newborn Data: Birth date:06/23/2021  Birth time:11:25 PM  Gender:Female  Living status:Living  Apgars:8 ,9  Weight:3170 g   Magnesium Sulfate received: No BMZ received: No Rhophylac:N/A MMR:N/A T-DaP:Given prenatally Flu: No Transfusion:No  Physical exam  Vitals:   06/24/21 1031 06/24/21 1410 06/24/21 2337 06/25/21 0500  BP: 103/60 110/71 111/82   Pulse: 79 67 88   Resp:  16 18 18   Temp: 98.2 F (36.8 C) 98.2 F (36.8 C) 98.7 F (37.1 C) 98.3 F (36.8 C)  TempSrc: Oral Oral Oral Oral  SpO2:       General: alert, cooperative, and no distress Lochia: appropriate Uterine Fundus: firm Incision: N/A DVT Evaluation: No evidence of DVT seen on physical exam. Negative Homan's sign. No cords or calf tenderness. No significant  calf/ankle edema. Labs: Lab Results  Component Value Date   WBC 8.7 06/23/2021   HGB 10.6 (L) 06/23/2021   HCT 34.0 (L) 06/23/2021   MCV 82.3 06/23/2021   PLT 323 06/23/2021   CMP Latest Ref Rng & Units 02/27/2020  Glucose 70 - 99 mg/dL 110(H)  BUN 6 - 20 mg/dL 11  Creatinine 0.44 - 1.00 mg/dL 0.63  Sodium 135 - 145 mmol/L 137  Potassium 3.5 - 5.1 mmol/L 3.4(L)  Chloride 98 - 111 mmol/L 102  CO2 22 - 32 mmol/L 27  Calcium 8.9 - 10.3 mg/dL 9.1  Total Protein 6.0 - 8.5 g/dL -  Total Bilirubin 0.0 - 1.2 mg/dL -  Alkaline Phos 39 - 117 IU/L -  AST 0 - 40 IU/L -  ALT 0 - 32 IU/L -   Edinburgh Score: Edinburgh Postnatal Depression Scale Screening Tool 02/08/2019  I have been able to laugh and see the funny side of things. 0  I have looked forward with enjoyment to things. 0  I have blamed myself unnecessarily when things went wrong. 0  I have been anxious or worried for no good reason. 0  I have felt scared or panicky for no good reason. 0  Things have been getting on top of me. 0  I have been so unhappy that I have had difficulty sleeping. 0  I have felt sad or miserable. 0  I have  been so unhappy that I have been crying. 0  The thought of harming myself has occurred to me. 0  Edinburgh Postnatal Depression Scale Total 0     After visit meds:  Allergies as of 06/25/2021   No Known Allergies      Medication List     TAKE these medications    Concept OB 130-92.4-1 MG Caps Take 1 tablet by mouth daily.   ibuprofen 600 MG tablet Commonly known as: ADVIL Take 1 tablet (600 mg total) by mouth every 6 (six) hours as needed for mild pain, moderate pain or cramping.         Discharge home in stable condition Infant Feeding: Bottle Infant Disposition:home with mother Discharge instruction: per After Visit Summary and Postpartum booklet. Activity: Advance as tolerated. Pelvic rest for 6 weeks.  Diet: routine diet Future Appointments: Future Appointments  Date Time  Provider [place]  07/22/2021 10:55 AM Aletha Halim, MD [facility]   Follow up Visit:  [place] [facility] [place] [facility] Follow up.   Specialty: Obstetrics and Gynecology Why: 4-6 weeks for your postpartum visit Contact information: [place] [phone]                 Please schedule this patient for a Virtual postpartum visit in 4 weeks with the following provider: Any provider. Additional Postpartum F/U:  Low risk pregnancy complicated by:  Delivery mode:  Vaginal, Spontaneous  Anticipated Birth Control:  Unsure/discussed abstinence until pp visit   06/25/2021 Roma Schanz, CNM

## 2021-06-23 NOTE — MAU Note (Signed)
Started contracting at noon, q 4-5 min.  Denies bleeding or leaking.  Reports +FM

## 2021-06-23 NOTE — MAU Note (Signed)
..  Adrienne Griffin is a 30 y.o. at 107w1d here in MAU reporting: contractions that are every 2 minutes. Denies vaginal bleeding or leaking of fluid. +FM

## 2021-06-23 NOTE — MAU Provider Note (Signed)
S: Patient is here for RN labor evaluation. Came in with frequent contractions every 3-5 min.  Patient reported feeling baby move. Denies vaginal bleeding and LOF. Fetal tracing, vital signs, & chart reviewed     O:  Vitals:   06/23/21 1452 06/23/21 1501 06/23/21 1504 06/23/21 1638  BP:   117/77 123/76  Pulse:   (!) 101 (!) 101  Resp:   18   Temp:   98.3 F (36.8 C)   TempSrc:   Oral   SpO2:  100%    Weight: 88.6 kg     Height: 5' (1.524 m)      No results found for this or any previous visit (from the past 24 hour(s)).  Dilation: 4.5 Effacement (%): 60 Cervical Position: Posterior Station: -3 Presentation: Vertex Exam by:: Bhambri Unchanged on check after 1 hr  Initially when on monitor, tracing without accels. After patient given fluids became reactive and remained reactive for >30 minutes  FHR: 155 bpm, Mod Var, no Decels, + Accels UC: irregular, every 7-9 min   A: 1. False labor      P:  RN to discharge home in stable condition with return precautions & fetal kick counts  Warner Mccreedy, MD, MPH OB Fellow, Faculty Practice

## 2021-06-23 NOTE — Anesthesia Preprocedure Evaluation (Signed)
Anesthesia Evaluation  Patient identified by MRN, date of birth, ID band Patient awake    Reviewed: Allergy & Precautions, Patient's Chart, lab work & pertinent test results  Airway Mallampati: II  TM Distance: >3 FB     Dental   Pulmonary neg pulmonary ROS,    Pulmonary exam normal        Cardiovascular negative cardio ROS Normal cardiovascular exam     Neuro/Psych negative neurological ROS     GI/Hepatic negative GI ROS, Neg liver ROS,   Endo/Other  negative endocrine ROS  Renal/GU negative Renal ROS     Musculoskeletal   Abdominal   Peds  Hematology  (+) anemia ,   Anesthesia Other Findings   Reproductive/Obstetrics (+) Pregnancy                             Anesthesia Physical Anesthesia Plan  ASA: 2  Anesthesia Plan: Epidural   Post-op Pain Management:    Induction:   PONV Risk Score and Plan: Treatment may vary due to age or medical condition  Airway Management Planned: Natural Airway  Additional Equipment:   Intra-op Plan:   Post-operative Plan:   Informed Consent: I have reviewed the patients History and Physical, chart, labs and discussed the procedure including the risks, benefits and alternatives for the proposed anesthesia with the patient or authorized representative who has indicated his/her understanding and acceptance.       Plan Discussed with:   Anesthesia Plan Comments:         Anesthesia Quick Evaluation

## 2021-06-23 NOTE — Anesthesia Procedure Notes (Signed)
Epidural Patient location during procedure: OB Start time: 06/23/2021 10:00 PM End time: 06/23/2021 10:08 PM  Staffing Anesthesiologist: Marcene Duos, MD Performed: anesthesiologist   Preanesthetic Checklist Completed: patient identified, IV checked, site marked, risks and benefits discussed, surgical consent, monitors and equipment checked, pre-op evaluation and timeout performed  Epidural Patient position: sitting Prep: DuraPrep and site prepped and draped Patient monitoring: continuous pulse ox and blood pressure Approach: midline Location: L4-L5 Injection technique: LOR air  Needle:  Needle type: Tuohy  Needle gauge: 17 G Needle length: 9 cm and 9 Needle insertion depth: 6 cm Catheter type: closed end flexible Catheter size: 19 Gauge Catheter at skin depth: 11 cm Test dose: negative  Assessment Events: blood not aspirated, injection not painful, no injection resistance, no paresthesia and negative IV test

## 2021-06-24 ENCOUNTER — Encounter (HOSPITAL_COMMUNITY): Payer: Self-pay | Admitting: Obstetrics and Gynecology

## 2021-06-24 LAB — RPR: RPR Ser Ql: NONREACTIVE

## 2021-06-24 MED ORDER — PRENATAL MULTIVITAMIN CH
1.0000 | ORAL_TABLET | Freq: Every day | ORAL | Status: DC
  Administered 2021-06-24: 1 via ORAL

## 2021-06-24 MED ORDER — IBUPROFEN 600 MG PO TABS
600.0000 mg | ORAL_TABLET | Freq: Four times a day (QID) | ORAL | Status: DC
  Administered 2021-06-24 – 2021-06-25 (×5): 600 mg via ORAL
  Filled 2021-06-24 (×2): qty 1

## 2021-06-24 MED ORDER — IBUPROFEN 600 MG PO TABS
ORAL_TABLET | ORAL | Status: AC
  Filled 2021-06-24: qty 1

## 2021-06-24 MED ORDER — ACETAMINOPHEN 325 MG PO TABS: 650.0000 mg | ORAL_TABLET | ORAL | Status: DC | PRN

## 2021-06-24 MED ORDER — WITCH HAZEL-GLYCERIN EX PADS: 1.0000 "application " | MEDICATED_PAD | CUTANEOUS | Status: DC | PRN

## 2021-06-24 MED ORDER — TETANUS-DIPHTH-ACELL PERTUSSIS 5-2.5-18.5 LF-MCG/0.5 IM SUSY: 0.5000 mL | PREFILLED_SYRINGE | Freq: Once | INTRAMUSCULAR | Status: DC

## 2021-06-24 MED ORDER — DIBUCAINE (PERIANAL) 1 % EX OINT: 1.0000 "application " | TOPICAL_OINTMENT | CUTANEOUS | Status: DC | PRN

## 2021-06-24 MED ORDER — BENZOCAINE-MENTHOL 20-0.5 % EX AERO
INHALATION_SPRAY | CUTANEOUS | Status: AC
  Filled 2021-06-24: qty 56

## 2021-06-24 MED ORDER — DIPHENHYDRAMINE HCL 25 MG PO CAPS: 25.0000 mg | ORAL_CAPSULE | Freq: Four times a day (QID) | ORAL | Status: DC | PRN

## 2021-06-24 MED ORDER — COCONUT OIL OIL
1.0000 "application " | TOPICAL_OIL | Status: DC | PRN
  Administered 2021-06-24: 1 via TOPICAL

## 2021-06-24 MED ORDER — ONDANSETRON HCL 4 MG/2ML IJ SOLN: 4.0000 mg | INTRAMUSCULAR | Status: DC | PRN

## 2021-06-24 MED ORDER — SIMETHICONE 80 MG PO CHEW: 80.0000 mg | CHEWABLE_TABLET | ORAL | Status: DC | PRN

## 2021-06-24 MED ORDER — BENZOCAINE-MENTHOL 20-0.5 % EX AERO
1.0000 "application " | INHALATION_SPRAY | CUTANEOUS | Status: DC | PRN
  Administered 2021-06-24: 1 via TOPICAL

## 2021-06-24 MED ORDER — ONDANSETRON HCL 4 MG PO TABS: 4.0000 mg | ORAL_TABLET | ORAL | Status: DC | PRN

## 2021-06-24 MED ORDER — SENNOSIDES-DOCUSATE SODIUM 8.6-50 MG PO TABS
2.0000 | ORAL_TABLET | Freq: Every day | ORAL | Status: DC
  Administered 2021-06-25: 2 via ORAL
  Filled 2021-06-24: qty 2

## 2021-06-24 NOTE — Progress Notes (Signed)
Post Partum Day 1 Subjective: no complaints, up ad lib, voiding and tolerating PO, small lochia, plans to breastfeed,  unsure right now, will thnk about it  Objective: Blood pressure 114/70, pulse 84, temperature 98.7 F (37.1 C), temperature source Oral, resp. rate 16, last menstrual period 09/22/2020, SpO2 100 %, unknown if currently breastfeeding.  Physical Exam:  General: alert, cooperative and no distress Lochia:normal flow Chest: CTAB Heart: RRR no m/r/g Abdomen: +BS, soft, nontender,  Uterine Fundus: firm DVT Evaluation: No evidence of DVT seen on physical exam. Extremities: trace edema  Recent Labs    06/23/21 2125  HGB 10.6*  HCT 34.0*    Assessment/Plan: Plan for discharge tomorrow, Breastfeeding, and Lactation consult   LOS: 1 day   Jacklyn Shell 06/24/2021, 7:45 AM

## 2021-06-24 NOTE — Progress Notes (Signed)
Post Partum Day 1  Subjective: Doing well. No acute events overnight. Pain is controlled and bleeding is appropriate. She is eating, drinking, voiding, and ambulating without issue. She is breast and bottle feeding which is going well. She has no other concerns at this time.  Objective: Blood pressure 120/68, pulse 81, temperature 99 F (37.2 C), temperature source Oral, resp. rate 16, last menstrual period 09/22/2020, SpO2 100 %, unknown if currently breastfeeding.  Physical Exam:  General: alert, cooperative, and no distress Lochia: appropriate Uterine Fundus: firm DVT Evaluation: No edema or calf tenderness.  Recent Labs    06/23/21 2125  HGB 10.6*  HCT 34.0*    Assessment/Plan: Adrienne Griffin is a 30 y.o. G4P004 on PPD# 1 s/p SVD.  Progressing well. Meeting postpartum milestones. VSS. Continue routine postpartum care.  Feeding: breast and bottle Contraception: undecided  Dispo: Anticipate discharge tomorrow   LOS: 1 day   Angela Cox, MD  06/24/2021, 8:03 AM

## 2021-06-24 NOTE — Lactation Note (Addendum)
This note was copied from a baby's chart. Lactation Consultation Note  Patient Name: Adrienne Griffin [id number] Date: 06/24/2021 Reason for consult: Initial assessment;Term;1st time breastfeeding Age:29 hours  P4 mother whose infant is now 18 hours old.  This is a term baby at 39+1 weeks.  Mother did not breast feed her other three children (now 32, 82 and 19 years old).  Mother's current feeding preference is breast/formula.  She has already provided formula once since delivery.  Reviewed breast feeding basics with mother.  Hand expression taught and mother able to express drops.  Assisted to latch in the cross cradle hold to the left breast easily.  With stimulation, baby fed for 10 minutes; mother denied pain.  She will continue to feed on cue and at least 8-12 times/24 hours.  Encouraged to call her RN/LC for assistance as needed.  Encouraged breast feeding with every feed and suggested mother refrain from formula if possible.  Education regarding milk "coming to volume" explained.  Mother appreciative.  Reviewed the supplementation guidelines if mother desires to supplement with formula.  Mom made aware of O/P services, breastfeeding support groups, community resources, and our phone # for post-discharge questions.  Mother will be a "stay at home" mother and is not interested in obtaining a DEBP.  She has a manual pump at bedside.  Father present.   Maternal Data Has patient been taught Hand Expression?: Yes Does the patient have breastfeeding experience prior to this delivery?: No  Feeding Mother's Current Feeding Choice: Breast Milk and Formula  LATCH Score Latch: Repeated attempts needed to sustain latch, nipple held in mouth throughout feeding, stimulation needed to elicit sucking reflex.  Audible Swallowing: None  Type of Nipple: Everted at rest and after stimulation  Comfort (Breast/Nipple): Soft / non-tender  Hold (Positioning): Assistance needed to correctly position infant at  breast and maintain latch.  LATCH Score: 6   Lactation Tools Discussed/Used    Interventions Interventions: Breast feeding basics reviewed;Assisted with latch;Skin to skin;Breast massage;Hand express;Breast compression;Hand pump;Coconut oil;Position options;Support pillows;Adjust position;Education  Discharge Pump: Manual (Mother is not planning on obtaining a DEBP; she prefers her manual pump) WIC Program: No  Consult Status Consult Status: Follow-up Date: 06/25/21 Follow-up type: In-patient    Adrienne Griffin Adrienne Griffin 06/24/2021, 8:31 AM

## 2021-06-24 NOTE — Plan of Care (Signed)
  Problem: Education: Goal: Knowledge of General Education information will improve Description: Including pain rating scale, medication(s)/side effects and non-pharmacologic comfort measures Outcome: Progressing   Problem: Health Behavior/Discharge Planning: Goal: Ability to manage health-related needs will improve Outcome: Progressing   Problem: Clinical Measurements: Goal: Ability to maintain clinical measurements within normal limits will improve Outcome: Progressing Goal: Will remain free from infection Outcome: Progressing Goal: Diagnostic test results will improve Outcome: Progressing Goal: Respiratory complications will improve Outcome: Progressing Goal: Cardiovascular complication will be avoided Outcome: Progressing   Problem: Activity: Goal: Risk for activity intolerance will decrease Outcome: Progressing   Problem: Nutrition: Goal: Adequate nutrition will be maintained Outcome: Progressing   Problem: Coping: Goal: Level of anxiety will decrease Outcome: Progressing   Problem: Elimination: Goal: Will not experience complications related to bowel motility Outcome: Progressing Goal: Will not experience complications related to urinary retention Outcome: Progressing   Problem: Pain Managment: Goal: General experience of comfort will improve Outcome: Progressing   Problem: Safety: Goal: Ability to remain free from injury will improve Outcome: Progressing   Problem: Skin Integrity: Goal: Risk for impaired skin integrity will decrease Outcome: Progressing   Problem: Education: Goal: Knowledge of Childbirth will improve Outcome: Completed/Met Goal: Ability to make informed decisions regarding treatment and plan of care will improve Outcome: Completed/Met Goal: Ability to state and carry out methods to decrease the pain will improve Outcome: Completed/Met Goal: Individualized Educational Video(s) Outcome: Completed/Met   Problem: Coping: Goal: Ability  to verbalize concerns and feelings about labor and delivery will improve Outcome: Completed/Met   Problem: Life Cycle: Goal: Ability to make normal progression through stages of labor will improve Outcome: Completed/Met Goal: Ability to effectively push during vaginal delivery will improve Outcome: Completed/Met   Problem: Role Relationship: Goal: Will demonstrate positive interactions with the child Outcome: Completed/Met   Problem: Safety: Goal: Risk of complications during labor and delivery will decrease Outcome: Completed/Met   Problem: Pain Management: Goal: Relief or control of pain from uterine contractions will improve Outcome: Completed/Met

## 2021-06-24 NOTE — Anesthesia Postprocedure Evaluation (Signed)
Anesthesia Post Note  Patient: Adrienne Griffin  Procedure(s) Performed: AN AD HOC LABOR EPIDURAL     Patient location during evaluation: Mother Baby Anesthesia Type: Epidural Level of consciousness: awake Pain management: satisfactory to patient Vital Signs Assessment: post-procedure vital signs reviewed and stable Respiratory status: spontaneous breathing Cardiovascular status: stable Anesthetic complications: no   No notable events documented.  Last Vitals:  Vitals:   06/24/21 0621 06/24/21 0745  BP: 114/70 120/68  Pulse: 84 81  Resp: 16 16  Temp: 37.1 C 37.2 C  SpO2:      Last Pain:  Vitals:   06/24/21 0745  TempSrc: Oral  PainSc:    Pain Goal: Patients Stated Pain Goal: 0 (06/23/21 2115)                 Cephus Shelling

## 2021-06-25 LAB — BIRTH TISSUE RECOVERY COLLECTION (PLACENTA DONATION)

## 2021-06-25 MED ORDER — IBUPROFEN 600 MG PO TABS: 600.0000 mg | ORAL_TABLET | Freq: Four times a day (QID) | ORAL | 0 refills | Status: AC | PRN

## 2021-06-25 NOTE — Lactation Note (Signed)
This note was copied from a baby's chart. Lactation Consultation Note  Patient Name: Girl Gracey Tolle [id number] Date: 06/25/2021 Reason for consult: Follow-up assessment Age:29 hours P4, this is mothers first infant to breastfeed. Mother has positional strips.  Assist mother with cross cradle hold. Infant latched well with good depth. Mother denied pain . Observed infant with good burst of suckling and swallows.  Mother taught to do breast compression.  Advised mother to continue to cue base feed, do STS and allow for cluster feeding. Mother to breastfeed 8-12 times in 24 hours or more.  Discussed treatment and prevention for engorgement.  Mother reports that she has good support system at home with mother and sister in laws that breastfed.  Mother to follow up with [facility] services and OP LC DEPT.   Maternal Data    Feeding Mother's Current Feeding Choice: Breast Milk  LATCH Score Latch: Repeated attempts needed to sustain latch, nipple held in mouth throughout feeding, stimulation needed to elicit sucking reflex.  Audible Swallowing: Spontaneous and intermittent  Type of Nipple: Everted at rest and after stimulation  Comfort (Breast/Nipple): Filling, red/small blisters or bruises, mild/mod discomfort  Hold (Positioning): Assistance needed to correctly position infant at breast and maintain latch.  LATCH Score: 7   Lactation Tools Discussed/Used    Interventions Interventions: Breast feeding basics reviewed;Assisted with latch;Skin to skin;Hand express;Adjust position;Breast compression;Support pillows;Position options;Hand pump;Education  Discharge Discharge Education: Engorgement and breast care;Warning signs for feeding baby;Outpatient recommendation  Consult Status Consult Status: Complete    Michel Bickers 06/25/2021, 11:42 AM

## 2021-06-25 NOTE — Discharge Instructions (Signed)
NO SEX UNTIL AFTER YOU GET YOUR BIRTH CONTROL   Call the office or go to [facility] hospital for these signs of pre-eclampsia: Severe headache that does not go away with Tylenol Visual changes- seeing spots, double, blurred vision Pain under your right breast or upper abdomen that does not go away with Tums or heartburn medicine Nausea and/or vomiting Severe swelling in your hands, feet, and face

## 2021-06-25 NOTE — Progress Notes (Signed)
POSTPARTUM PROGRESS NOTE  Post Partum Day 2  Subjective:  Adrienne Griffin is a 30 y.o. [id number] s/p SVD at [redacted]w[redacted]d.  No acute events overnight.  Pt denies problems with ambulating, voiding or po intake.  She denies nausea or vomiting.  Pain is well controlled.  She has had flatus. She has had bowel movement.  Lochia Minimal.   Objective: Blood pressure 111/82, pulse 88, temperature 98.3 F (36.8 C), temperature source Oral, resp. rate 18, last menstrual period 09/22/2020, SpO2 100 %, unknown if currently breastfeeding.  Physical Exam:  General: alert, cooperative and no distress Chest: no respiratory distress Heart:regular rate, distal pulses intact Abdomen: soft, nontender,  Uterine Fundus: firm, appropriately tender DVT Evaluation: No calf swelling or tenderness Extremities: no edema Skin: warm, dry  Recent Labs    06/23/21 2125  HGB 10.6*  HCT 34.0*    Assessment/Plan: Adrienne Griffin is a 30 y.o. [id number] s/p SVD at [redacted]w[redacted]d   PPD#2 - Doing well Contraception: undecided Feeding: both Dispo: Plan for discharge today.   LOS: 2 days   Adrienne Griffin, CNM 06/25/2021, 7:36 AM

## 2021-06-26 ENCOUNTER — Telehealth: Payer: Self-pay

## 2021-06-26 NOTE — Telephone Encounter (Signed)
Transition Care Management Unsuccessful Follow-up Telephone Call  Date of discharge and from where:  06/25/2021-[facility]  Attempts:  1st Attempt  Reason for unsuccessful TCM follow-up call:  Left voice message    

## 2021-06-27 NOTE — Telephone Encounter (Signed)
Transition Care Management Follow-up Telephone Call Date of discharge and from where: 06/25/2021-[facility]  How have you been since you were released from the hospital? Patient stated she is doing fine.  Any questions or concerns? No  Items Reviewed: Did the pt receive and understand the discharge instructions provided? Yes  Medications obtained and verified? Yes  Other? No  Any new allergies since your discharge? No  Dietary orders reviewed? No Do you have support at home? Yes   Home Care and Equipment/Supplies: Were home health services ordered? not applicable If so, what is the name of the agency? N/A  Has the agency set up a time to come to the patient's home? not applicable Were any new equipment or medical supplies ordered?  No What is the name of the medical supply agency? N/A Were you able to get the supplies/equipment? not applicable Do you have any questions related to the use of the equipment or supplies? No  Functional Questionnaire: (I = Independent and D = Dependent) ADLs: I  Bathing/Dressing- I  Meal Prep- I  Eating- I  Maintaining continence- I  Transferring/Ambulation- I  Managing Meds- I  Follow up appointments reviewed:  PCP Hospital f/u appt confirmed? No   Specialist Hospital f/u appt confirmed? Yes  Scheduled to see OBGYN on 06/30/2021 @ 10:55AM. Are transportation arrangements needed? No  If their condition worsens, is the pt aware to call PCP or go to the Emergency Dept.? Yes Was the patient provided with contact information for the PCP's office or ED? Yes Was to pt encouraged to call back with questions or concerns? Yes

## 2021-06-30 ENCOUNTER — Encounter: Payer: Medicaid Other | Admitting: Obstetrics & Gynecology

## 2021-07-05 ENCOUNTER — Telehealth (HOSPITAL_COMMUNITY): Payer: Self-pay

## 2021-07-05 NOTE — Telephone Encounter (Signed)
No answer. Left message to return nurse call.  Marcelino Duster [facility] 07/05/2021,1414

## 2021-07-22 ENCOUNTER — Telehealth: Payer: Medicaid Other | Admitting: Obstetrics and Gynecology

## 2021-07-22 ENCOUNTER — Telehealth: Payer: Self-pay | Admitting: Radiology

## 2021-07-22 ENCOUNTER — Telehealth (INDEPENDENT_AMBULATORY_CARE_PROVIDER_SITE_OTHER): Payer: Medicaid Other | Admitting: Obstetrics and Gynecology

## 2021-07-22 ENCOUNTER — Other Ambulatory Visit: Payer: Self-pay

## 2021-07-22 ENCOUNTER — Encounter: Payer: Self-pay | Admitting: Obstetrics and Gynecology

## 2021-07-22 DIAGNOSIS — Z1389 Encounter for screening for other disorder: Secondary | ICD-10-CM

## 2021-07-22 NOTE — Progress Notes (Signed)
TELEHEALTH POSTPARTUM VISIT ENCOUNTER NOTE  Provider location: [facility]   I connected with Adrienne Griffin on 07/22/21 at  2:30 PM EST by telephone at home and verified that I am speaking with the correct person using two identifiers.   I discussed the limitations, risks, security and privacy concerns of performing an evaluation and management service by telephone and the availability of in person appointments. I also discussed with the patient that there may be a patient responsible charge related to this service. The patient expressed understanding and agreed to proceed.  Appointment Date: 07/22/2021  Chief Complaint:  Postpartum Visit  History of Present Illness:  Adrienne Griffin is a 30 y.o. [id number] s/p 11/28 SVd/intact perineum at 39wks. Pregnancy was uncomplicated.  I have fully reviewed the prenatal and intrapartum course.  Anesthesia: epidural. Postpartum course has been unremarkable. Baby is doing well. Baby is feeding by Bottle Gerber Gentle Bleeding no bleeding but had some recently (?period). Bowel function is normal. Bladder function is normal. Patient is not sexually active. Contraception method is none. Postpartum depression screening: negative.  Review of Systems: as noted in the History of Present Illness.  Patient Active Problem List   Diagnosis Date Noted   Indication for care in labor or delivery 06/23/2021   Supervision of normal pregnancy 07/28/2018   BMI 30-39.9 07/28/2018   UTI in pregnancy 07/28/2018   Short interval between pregnancies affecting pregnancy, antepartum 07/28/2018   Obesity in pregnancy 04/24/2015    Medications Saidi K. Schuermann had no medications administered during this visit. Current Outpatient Medications  Medication Sig Dispense Refill   ibuprofen (ADVIL) 600 MG tablet Take 1 tablet (600 mg total) by mouth every 6 (six) hours as needed for mild pain, moderate pain or cramping. (Patient not  taking: Reported on 07/22/2021) 30 tablet 0   Prenat w/o A Vit-FeFum-FePo-FA (CONCEPT OB) 130-92.4-1 MG CAPS Take 1 tablet by mouth daily. (Patient not taking: Reported on 07/22/2021) 30 capsule 12   No current facility-administered medications for this visit.    Allergies Patient has no known allergies.  Physical Exam:  General:  Alert, oriented and cooperative.   Mental Status: Normal mood and affect perceived. Normal judgment and thought content.  Rest of physical exam deferred due to type of encounter  PP Depression Screening:    Edinburgh Postnatal Depression Scale - 07/22/21 1419       Edinburgh Postnatal Depression Scale:  In the Past 7 Days   I have been able to laugh and see the funny side of things. 0    I have looked forward with enjoyment to things. 0    I have blamed myself unnecessarily when things went wrong. 0    I have been anxious or worried for no good reason. 0    I have felt scared or panicky for no good reason. 0    Things have been getting on top of me. 0    I have been so unhappy that I have had difficulty sleeping. 0    I have felt sad or miserable. 0    I have been so unhappy that I have been crying. 0    The thought of harming myself has occurred to me. 0    Edinburgh Postnatal Depression Scale Total 0             Assessment:Patient is a 30 y.o. [id number] who is 4 weeks postpartum from a normal spontaneous vaginal delivery.  She is doing well.   Plan: Routine care. Potential period and pt told to track to see. D/w her that she's due for pap early next year. Patient undecided and can d/w her more at annual  RTC 2-4 months for annual and pap  I discussed the assessment and treatment plan with the patient. The patient was provided an opportunity to ask questions and all were answered. The patient agreed with the plan and demonstrated an understanding of the instructions.   The patient was advised to call back or seek an in-person evaluation/go to  the ED for any concerning postpartum symptoms.  I provided 15 minutes of non-face-to-face time during this encounter.   [place] Bing, MD [facility] for [employer] [facility]

## 2021-07-22 NOTE — Telephone Encounter (Signed)
Left message for patient to call [facility] to start postpartum virtual visit with Dr Vergie Living

## 2022-11-21 IMAGING — US US MFM OB FOLLOW-UP
1 series · 14 of 28 positions shown · non-contrast
Comparison: none

[Series 1: us mfm ob follow-up · 50 acquisitions, 14 frames shown]
[im 2/50]
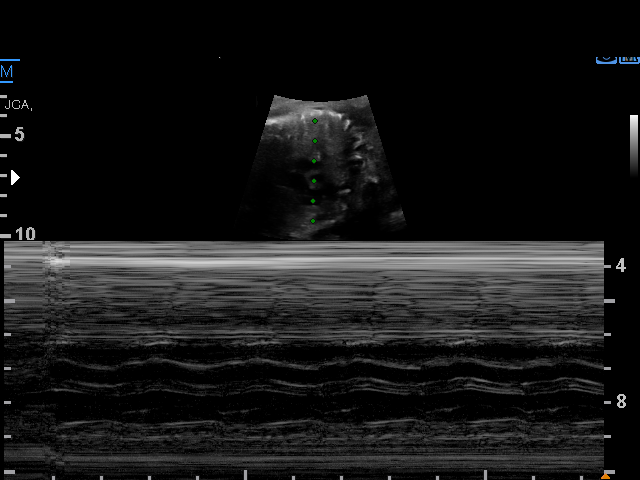
[im 6/50]
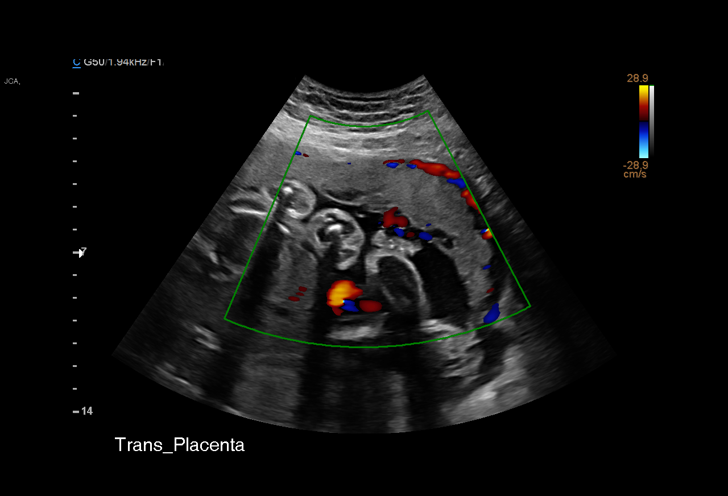
[im 10/50]
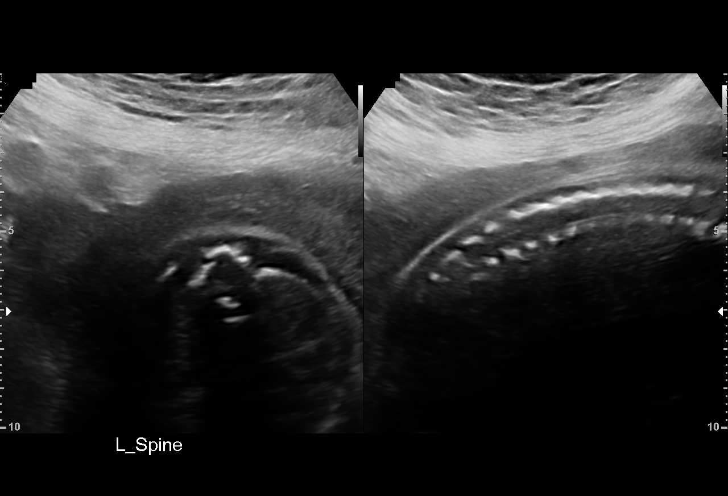
[im 13/50]
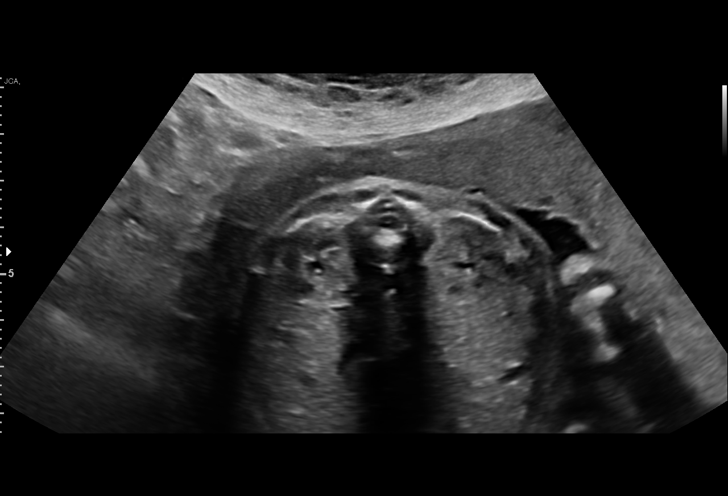
[im 17/50]
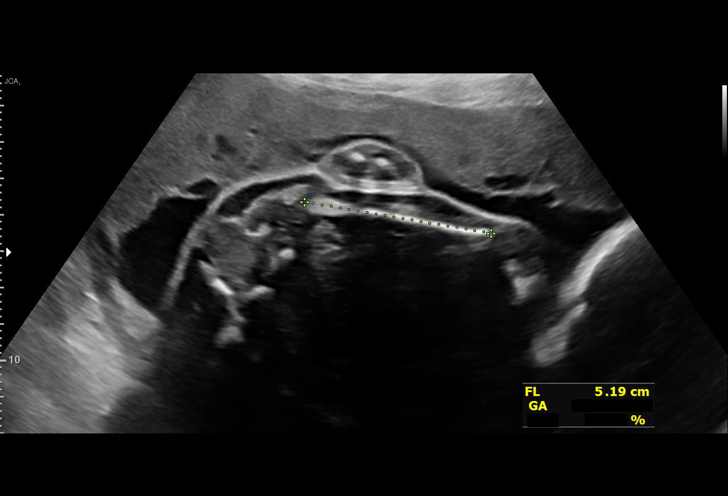
[im 20/50]
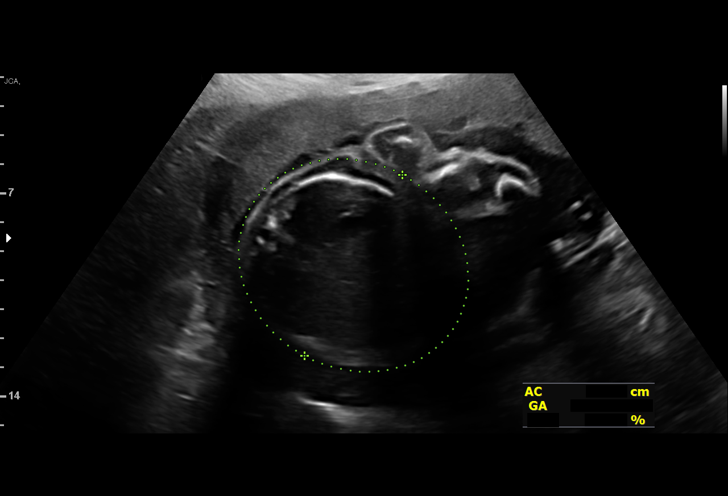
[im 24/50]
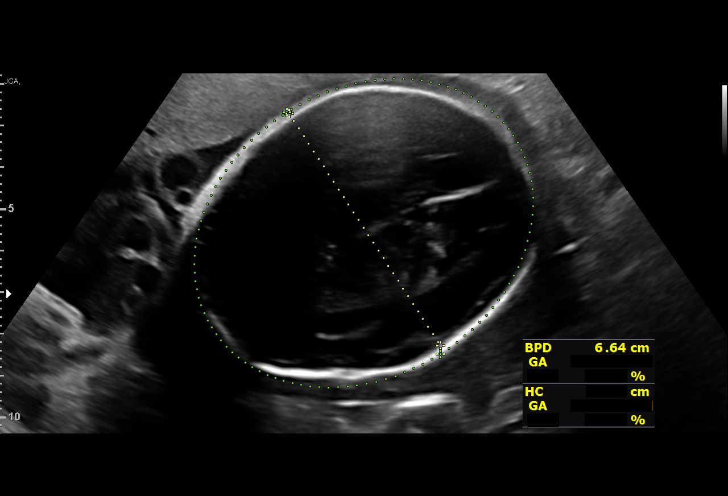
[im 28/50]
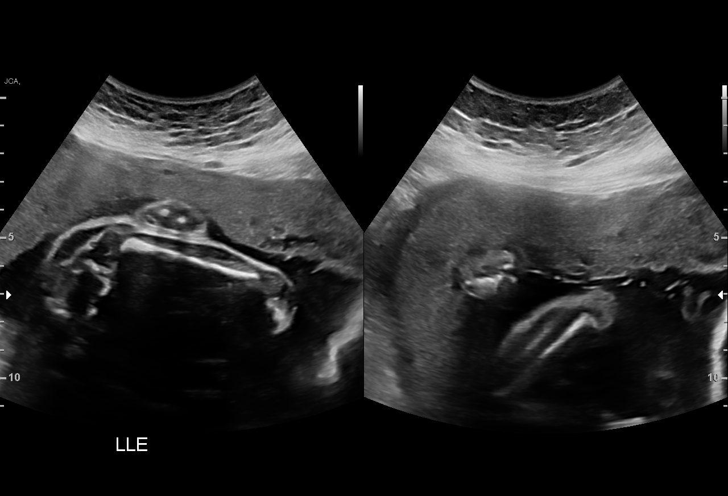
[im 31/50]
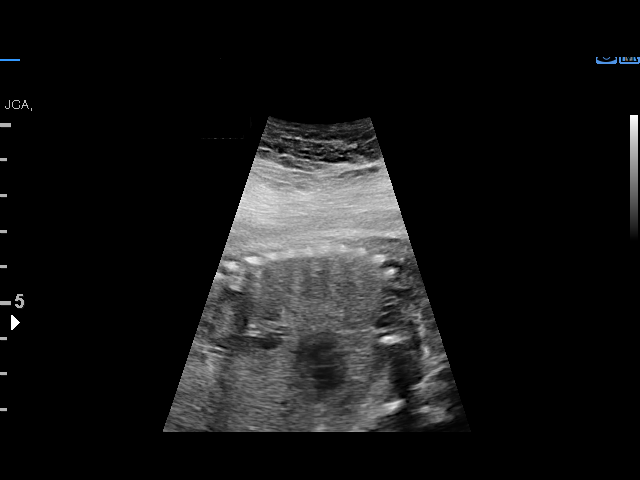
[im 35/50]
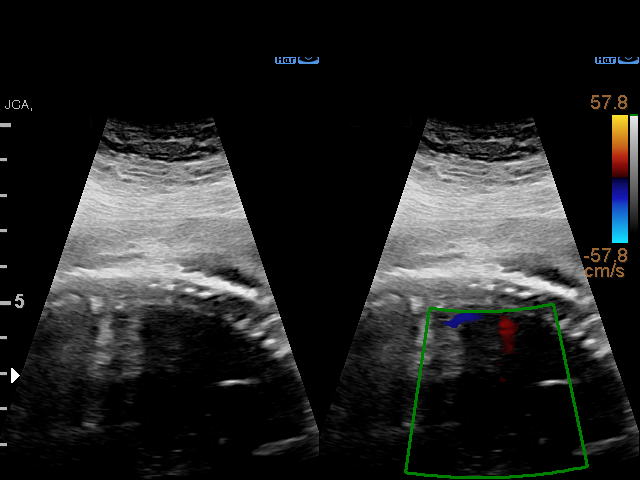
[im 39/50]
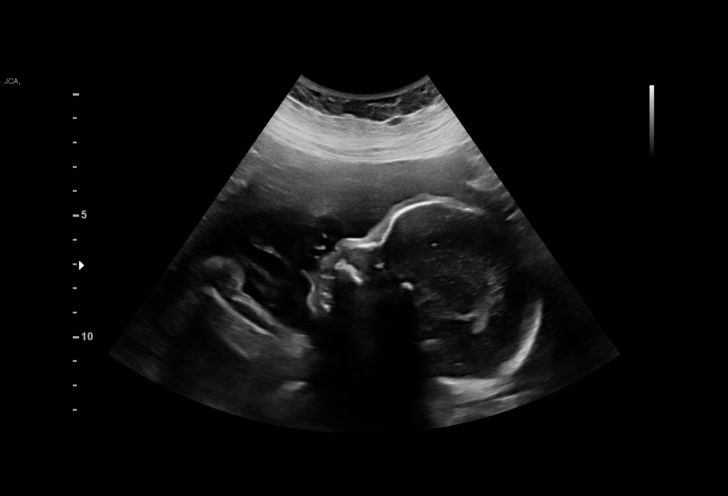
[im 42/50]
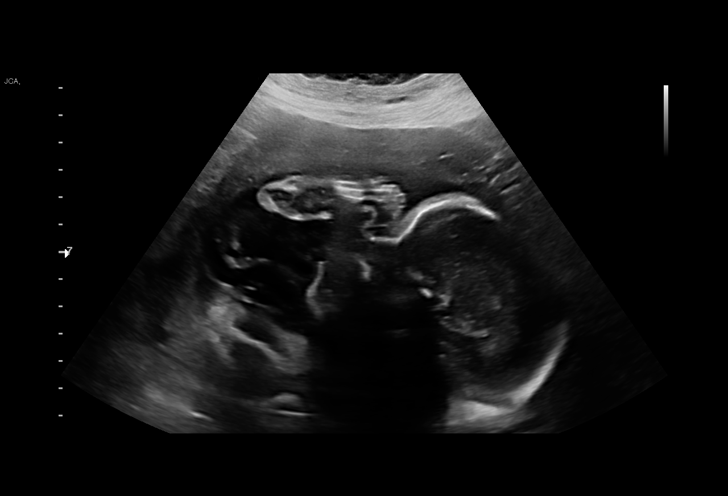
[im 46/50]
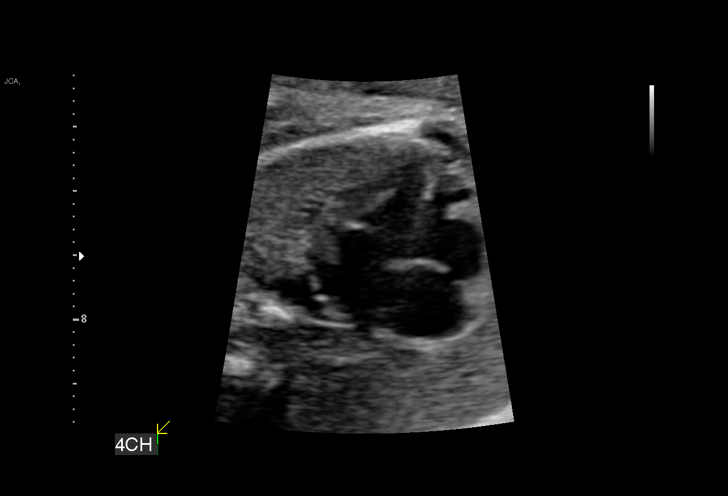
[im 50/50]
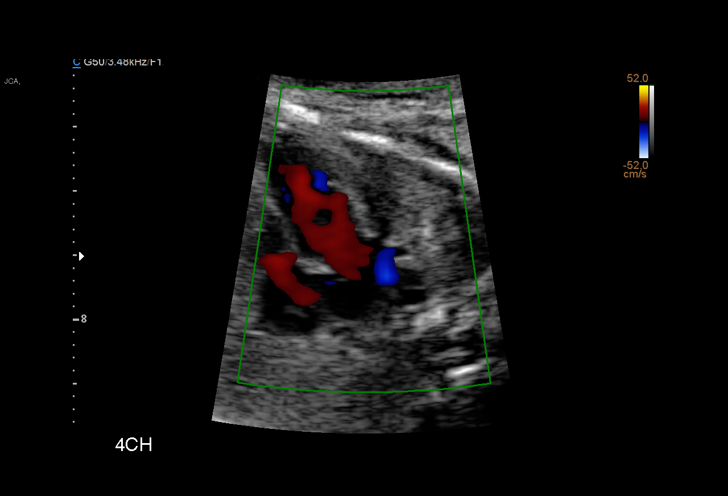

[14 of 28 positions shown; findings below may reference images not displayed]

Indications

 Obesity complicating pregnancy (BMI 38)
 26 weeks gestation of pregnancy
 Encounter for antenatal screening for
 malformations
 Low Risk NIPS
Fetal Evaluation

 Num Of Fetuses:         1
 Fetal Heart Rate(bpm):  140
 Cardiac Activity:       Observed
 Presentation:           Cephalic
 Placenta:               Anterior
 P. Cord Insertion:      Previously Visualized

 Amniotic Fluid
 AFI FV:      Within normal limits
Biometry

 BPD:      66.8  mm     G. Age:  26w 6d         67  %    CI:        76.71   %    70 - 86
                                                         FL/HC:      21.0   %    18.6 -
 HC:      241.6  mm     G. Age:  26w 2d         28  %    HC/AC:      1.01        1.04 -
 AC:      238.6  mm     G. Age:  28w 1d         92  %    FL/BPD:     76.0   %    71 - 87
 FL:       50.8  mm     G. Age:  27w 2d         69  %    FL/AC:      21.3   %    20 - 24
 HUM:      48.1  mm     G. Age:  28w 1d         93  %

 Est. FW:    5340  gm      2 lb 7 oz     91  %
OB History

 Blood Type:   O+
 Gravidity:    4         Term:   3
Gestational Age

 LMP:           26w 1d        Date:  09/22/20                 EDD:   06/29/21
 U/S Today:     27w 1d                                        EDD:   06/22/21
 Best:          26w 1d     Det. By:  LMP  (09/22/20)          EDD:   06/29/21
Anatomy

 Cranium:               Appears normal         LVOT:                   Not well visualized
 Cavum:                 Appears normal         Aortic Arch:            Appears normal
 Ventricles:            Appears normal         Ductal Arch:            Appears normal
 Choroid Plexus:        Appears normal         Diaphragm:              Appears normal
 Cerebellum:            Appears normal         Stomach:                Appears normal, left
                                                                       sided
 Posterior Fossa:       Appears normal         Abdomen:                Appears normal
 Nuchal Fold:           Appears normal         Abdominal Wall:         Previously seen
 Face:                  Appears normal         Cord Vessels:           Previously seen
                        (orbits and profile)
 Lips:                  Not well visualized    Kidneys:                Appear normal
 Palate:                Limited Views          Bladder:                Appears normal
 Thoracic:              Not well visualized    Spine:                  Appears normal
 Heart:                 Appears normal         Upper Extremities:      Appears normal
                        (4CH, axis, and
                        situs)
 RVOT:                  Appears normal         Lower Extremities:      Appears normal

 Other:  Technically difficult due to maternal habitus and fetal position. Fetus
         appears to be female. Nasal bone visualized.
Impression

 Patient returned for completion of fetal anatomy .Amniotic
 fluid is normal and good fetal activity is seen .Fetal biometry
 is consistent with her previously-established dates .Fetal
 anatomical survey was completed (except LVOT and lips)
 and appears normal.
Recommendations

 -An appointment was made for her to return in 4 weeks for
 fetal growth assessment and complete fetal anatomy (lips
 and LVOT).
                 Elam, Hristo

## 2023-11-23 ENCOUNTER — Encounter: Payer: Self-pay | Admitting: Obstetrics and Gynecology

## 2024-02-08 DIAGNOSIS — F419 Anxiety disorder, unspecified: Secondary | ICD-10-CM | POA: Diagnosis not present

## 2024-02-21 DIAGNOSIS — F419 Anxiety disorder, unspecified: Secondary | ICD-10-CM | POA: Diagnosis not present

## 2024-03-21 DIAGNOSIS — F419 Anxiety disorder, unspecified: Secondary | ICD-10-CM | POA: Diagnosis not present

## 2024-04-11 DIAGNOSIS — F419 Anxiety disorder, unspecified: Secondary | ICD-10-CM | POA: Diagnosis not present

## 2024-04-17 DIAGNOSIS — N3000 Acute cystitis without hematuria: Secondary | ICD-10-CM | POA: Diagnosis not present
